# Patient Record
Sex: Male | Born: 1945
Health system: Southern US, Community
[De-identification: ages and names within clinical notes are randomized; demographics above are authoritative.]

## PROBLEM LIST (undated history)

## (undated) DIAGNOSIS — E785 Hyperlipidemia, unspecified: Secondary | ICD-10-CM

## (undated) DIAGNOSIS — I5032 Chronic diastolic (congestive) heart failure: Secondary | ICD-10-CM

## (undated) DIAGNOSIS — G4730 Sleep apnea, unspecified: Secondary | ICD-10-CM

## (undated) DIAGNOSIS — I8390 Asymptomatic varicose veins of unspecified lower extremity: Secondary | ICD-10-CM

## (undated) DIAGNOSIS — I214 Non-ST elevation (NSTEMI) myocardial infarction: Secondary | ICD-10-CM

## (undated) DIAGNOSIS — K635 Polyp of colon: Secondary | ICD-10-CM

## (undated) DIAGNOSIS — D649 Anemia, unspecified: Secondary | ICD-10-CM

## (undated) DIAGNOSIS — H269 Unspecified cataract: Secondary | ICD-10-CM

## (undated) DIAGNOSIS — N179 Acute kidney failure, unspecified: Secondary | ICD-10-CM

## (undated) DIAGNOSIS — F329 Major depressive disorder, single episode, unspecified: Secondary | ICD-10-CM

## (undated) DIAGNOSIS — R0600 Dyspnea, unspecified: Secondary | ICD-10-CM

## (undated) DIAGNOSIS — F32A Depression, unspecified: Secondary | ICD-10-CM

## (undated) DIAGNOSIS — M1990 Unspecified osteoarthritis, unspecified site: Secondary | ICD-10-CM

## (undated) DIAGNOSIS — I5033 Acute on chronic diastolic (congestive) heart failure: Secondary | ICD-10-CM

## (undated) DIAGNOSIS — J189 Pneumonia, unspecified organism: Secondary | ICD-10-CM

## (undated) DIAGNOSIS — I10 Essential (primary) hypertension: Secondary | ICD-10-CM

## (undated) HISTORY — DX: Hyperlipidemia, unspecified: E78.5

## (undated) HISTORY — DX: Essential (primary) hypertension: I10

## (undated) HISTORY — DX: Asymptomatic varicose veins of unspecified lower extremity: I83.90

## (undated) HISTORY — DX: Unspecified cataract: H26.9

## (undated) HISTORY — PX: ENDOSCOPIC VEIN LASER TREATMENT: SHX1508

## (undated) HISTORY — PX: TONSILLECTOMY: SUR1361

## (undated) HISTORY — DX: Polyp of colon: K63.5

## (undated) HISTORY — DX: Sleep apnea, unspecified: G47.30

---

## 2008-11-04 ENCOUNTER — Encounter (INDEPENDENT_AMBULATORY_CARE_PROVIDER_SITE_OTHER): Payer: Self-pay | Admitting: Gastroenterology

## 2008-11-04 ENCOUNTER — Ambulatory Visit (HOSPITAL_COMMUNITY): Admission: RE | Admit: 2008-11-04 | Discharge: 2008-11-04 | Payer: Self-pay | Admitting: Gastroenterology

## 2011-08-21 ENCOUNTER — Other Ambulatory Visit: Payer: Self-pay | Admitting: Family Medicine

## 2012-09-24 ENCOUNTER — Other Ambulatory Visit: Payer: Self-pay | Admitting: Family Medicine

## 2012-10-23 ENCOUNTER — Encounter: Payer: Self-pay | Admitting: Vascular Surgery

## 2012-10-23 ENCOUNTER — Other Ambulatory Visit: Payer: Self-pay | Admitting: *Deleted

## 2012-10-23 DIAGNOSIS — M7989 Other specified soft tissue disorders: Secondary | ICD-10-CM

## 2012-10-23 DIAGNOSIS — L539 Erythematous condition, unspecified: Secondary | ICD-10-CM

## 2012-11-04 DIAGNOSIS — R7301 Impaired fasting glucose: Secondary | ICD-10-CM | POA: Insufficient documentation

## 2012-11-04 DIAGNOSIS — I10 Essential (primary) hypertension: Secondary | ICD-10-CM

## 2012-11-04 DIAGNOSIS — E782 Mixed hyperlipidemia: Secondary | ICD-10-CM | POA: Insufficient documentation

## 2012-11-04 HISTORY — DX: Essential (primary) hypertension: I10

## 2012-11-12 ENCOUNTER — Encounter: Payer: Self-pay | Admitting: Vascular Surgery

## 2012-11-13 ENCOUNTER — Ambulatory Visit (HOSPITAL_COMMUNITY)
Admission: RE | Admit: 2012-11-13 | Discharge: 2012-11-13 | Disposition: A | Discharge status: Home / Self Care | Payer: Medicare Other | Source: Ambulatory Visit | Attending: Vascular Surgery | Admitting: Vascular Surgery

## 2012-11-13 ENCOUNTER — Encounter (HOSPITAL_COMMUNITY): Payer: Self-pay | Admitting: Emergency Medicine

## 2012-11-13 ENCOUNTER — Ambulatory Visit (INDEPENDENT_AMBULATORY_CARE_PROVIDER_SITE_OTHER): Payer: Medicare Other | Admitting: Vascular Surgery

## 2012-11-13 ENCOUNTER — Encounter: Payer: Self-pay | Admitting: Vascular Surgery

## 2012-11-13 ENCOUNTER — Emergency Department (HOSPITAL_COMMUNITY)
Admission: EM | Admit: 2012-11-13 | Discharge: 2012-11-14 | Disposition: A | Discharge status: Home / Self Care | Payer: Medicare Other | Attending: Emergency Medicine | Admitting: Emergency Medicine

## 2012-11-13 VITALS — Wt 369.0 lb

## 2012-11-13 DIAGNOSIS — L539 Erythematous condition, unspecified: Secondary | ICD-10-CM

## 2012-11-13 DIAGNOSIS — I83893 Varicose veins of bilateral lower extremities with other complications: Secondary | ICD-10-CM | POA: Insufficient documentation

## 2012-11-13 DIAGNOSIS — I83891 Varicose veins of right lower extremities with other complications: Secondary | ICD-10-CM

## 2012-11-13 DIAGNOSIS — Z862 Personal history of diseases of the blood and blood-forming organs and certain disorders involving the immune mechanism: Secondary | ICD-10-CM | POA: Insufficient documentation

## 2012-11-13 DIAGNOSIS — M7989 Other specified soft tissue disorders: Secondary | ICD-10-CM | POA: Insufficient documentation

## 2012-11-13 DIAGNOSIS — I8310 Varicose veins of unspecified lower extremity with inflammation: Secondary | ICD-10-CM

## 2012-11-13 DIAGNOSIS — Z79899 Other long term (current) drug therapy: Secondary | ICD-10-CM | POA: Insufficient documentation

## 2012-11-13 DIAGNOSIS — Z8669 Personal history of other diseases of the nervous system and sense organs: Secondary | ICD-10-CM | POA: Insufficient documentation

## 2012-11-13 DIAGNOSIS — Z8639 Personal history of other endocrine, nutritional and metabolic disease: Secondary | ICD-10-CM | POA: Insufficient documentation

## 2012-11-13 DIAGNOSIS — I10 Essential (primary) hypertension: Secondary | ICD-10-CM | POA: Insufficient documentation

## 2012-11-13 DIAGNOSIS — Z860100 Personal history of colon polyps, unspecified: Secondary | ICD-10-CM | POA: Insufficient documentation

## 2012-11-13 NOTE — ED Notes (Signed)
Dressing removed.  Bleeding controlled.

## 2012-11-13 NOTE — ED Notes (Signed)
Patient is alert and oriented x3.  He is complaining of bleeding from a varicose vein in the right lower extremity.  He denies having this issue before.  Bleeding has been dressing applied.

## 2012-11-13 NOTE — ED Notes (Signed)
Pt reports having varicose veins and noticed blood to leg while going into the bathroom. Does not recall hitting leg on anything. Denies any dizziness or lightheadedness. Pt reports a moderate flow from area but not projectile blood from site.

## 2012-11-13 NOTE — Progress Notes (Signed)
[facility] [place] HISTORY AND PHYSICAL   History of Present Illness:  Patient is a 67 y.o. year old male who presents for evaluation of varicose veins and leg swelling.  Other medical problems include hypertension, hyperlipidemia, sleep apnea, obesity. He is currently in a weight control program at [facility]. The patient has had intermittent swelling in both lower extremities over the last two-year's. He also has itching of his skin over several varicosities in both legs. This is been controlled in the past with cortisone cream and occasional antibiotics for inflammation. He has not used compression stockings in the past. His right leg is worse than his left. He denies prior history of DVT. He has had no prior bleeding episode or ulcerations. His legs get heavy or and ache if he is on his feet for long periods of time.  Past Medical History  Diagnosis Date  . Hypertension   . Hyperlipidemia   . Sleep apnea   . Colon polyps   . Cataracts, bilateral     Past Surgical History  Procedure Laterality Date  . Tonsillectomy      Social History History  Substance Use Topics  . Smoking status: Never Smoker   . Smokeless tobacco: Never Used  . Alcohol Use: No    Family History Family History  Problem Relation Age of Onset  . Cancer Mother     colon  . Other Mother     varicose veins  . Heart disease Father   . Heart attack Father   . Diabetes Sister     Allergies  No Known Allergies   Current Outpatient Prescriptions  Medication Sig Dispense Refill  . buPROPion (WELLBUTRIN XL) 150 MG 24 hr tablet Take 150 mg by mouth daily.      . Cholecalciferol (VITAMIN D3) 2000 UNITS TABS Take by mouth.      . ezetimibe-simvastatin (VYTORIN) 10-40 MG per tablet Take 1 tablet by mouth at bedtime.      . ferrous sulfate 325 (65 FE) MG tablet Take 325 mg by mouth daily with breakfast.      . losartan-hydrochlorothiazide (HYZAAR) 100-25 MG per tablet Take 1  tablet by mouth daily.      . Multiple Vitamin (MULTIVITAMIN) tablet Take 1 tablet by mouth daily.      . Sulfamethoxazole-Trimethoprim (BACTRIM PO) Take by mouth.      . topiramate (TOPAMAX) 25 MG capsule Take 25 mg by mouth 2 (two) times daily.       No current facility-administered medications for this visit.    ROS:   General:  + weight loss 35 pounds since starting program, Fever, chills  HEENT: No recent headaches, no nasal bleeding, no visual changes, no sore throat  Neurologic: No dizziness, blackouts, seizures. No recent symptoms of stroke or mini- stroke. No recent episodes of slurred speech, or temporary blindness.  Cardiac: No recent episodes of chest pain/pressure, no shortness of breath at rest.  +shortness of breath with exertion.  Denies history of atrial fibrillation or irregular heartbeat  Vascular: No history of rest pain in feet.  No history of claudication.  No history of non-healing ulcer, No history of DVT   Pulmonary: No home oxygen, no productive cough, no hemoptysis,  No asthma or wheezing  Musculoskeletal:  [ ]  Arthritis, [ ]  Low back pain,  [x Joint pain  Hematologic:No history of hypercoagulable state.  No history of easy bleeding.  No history of anemia  Gastrointestinal: No hematochezia or  melena,  No gastroesophageal reflux, no trouble swallowing  Urinary: [ ]  chronic Kidney disease, [ ]  on HD - [ ]  MWF or [ ]  TTHS, [ ]  Burning with urination, [ ]  Frequent urination, [ ]  Difficulty urinating;   Skin: No rashes  Psychological: No history of anxiety,  No history of depression   Physical Examination  Filed Vitals:   11/13/12 1037  Weight: 369 lb (167.377 kg)    General:  Alert and oriented, no acute distress HEENT: Normal Neck: No bruit or JVD Pulmonary: Clear to auscultation bilaterally Cardiac: Regular Rate and Rhythm without murmur Abdomen: Soft, non-tender, non-distended, no mass, severe obesity Skin: No rash, bilateral hemosiderin  staining circumferential calf, no ulcerations, large posterior 78 mm clusters of varicosities posterior medial calf bilaterally Extremity Pulses:  2+ radial, brachial, femoral, dorsalis pedis, posterior tibial pulses bilaterally Musculoskeletal: No deformity trace edema  Neurologic: Upper and lower extremity motor 5/5 and symmetric  DATA:  The patient had a venous duplex exam today of his left lower extremity. This showed deep venous reflux as well as superficial venous reflux vein diameter was 5-13 mm. I reviewed and interpreted this study.   ASSESSMENT:  Symptomatic varicose veins with stasis dermatitis and leg swelling   PLAN:  The patient was given a prescription today for bilateral compression stockings. I discussed the pathophysiology of superficial and deep venous reflux today. I also discussed potentially doing a laser ablation of his greater saphenous vein. The patient is going to work on continued weight loss for now. He will followup next make 2015 for further review.  Fabienne Bruns, MD [facility] [place] Office: [phone] Pager: [phone]

## 2012-11-14 NOTE — ED Provider Notes (Signed)
CSN: [id number]     Arrival date & time 11/13/12  2207 History   First MD Initiated Contact with Patient 11/13/12 2338     Chief Complaint  Patient presents with  . Varicose Veins   (Consider location/radiation/quality/duration/timing/severity/associated sxs/prior Treatment) Patient is a 67 y.o. male presenting with leg pain. The history is provided by the patient.  Leg Pain Location:  Leg Leg location:  R upper leg Associated symptoms comment:  He has varicose veins in bilateral lower extremities. Tonight, without known injury, he started bleeding on anterior right thigh at the site of one of the varicosities. No pain.    Past Medical History  Diagnosis Date  . Hypertension   . Hyperlipidemia   . Sleep apnea   . Colon polyps   . Cataracts, bilateral    Past Surgical History  Procedure Laterality Date  . Tonsillectomy     Family History  Problem Relation Age of Onset  . Cancer Mother     colon  . Other Mother     varicose veins  . Heart disease Father   . Heart attack Father   . Diabetes Sister    History  Substance Use Topics  . Smoking status: Never Smoker   . Smokeless tobacco: Never Used  . Alcohol Use: No    Review of Systems  Musculoskeletal:       See HPI.    Allergies  Review of patient's allergies indicates no known allergies.  Home Medications   Current Outpatient Rx  Name  Route  Sig  Dispense  Refill  . buPROPion (WELLBUTRIN XL) 150 MG 24 hr tablet   Oral   Take 150 mg by mouth daily.         . Cholecalciferol (VITAMIN D3) 2000 UNITS TABS   Oral   Take by mouth.         . ezetimibe-simvastatin (VYTORIN) 10-40 MG per tablet   Oral   Take 1 tablet by mouth at bedtime.         . ferrous sulfate 325 (65 FE) MG tablet   Oral   Take 325 mg by mouth daily with breakfast.         . losartan-hydrochlorothiazide (HYZAAR) 100-25 MG per tablet   Oral   Take 1 tablet by mouth daily.         . Multiple Vitamin (MULTIVITAMIN)  tablet   Oral   Take 1 tablet by mouth daily.         Marland Kitchen topiramate (TOPAMAX) 25 MG capsule   Oral   Take 25 mg by mouth 2 (two) times daily.          BP 144/67  Pulse 66  Temp(Src) 98.7 F (37.1 C) (Oral)  Resp 20  SpO2 98% Physical Exam  Musculoskeletal:  Varicose veins anterior right thigh with small point of bleed. No swelling. No laceration or bruising.     ED Course  Procedures (including critical care time) Labs Review Labs Reviewed - No data to display Imaging Review No results found.  EKG Interpretation   None       MDM  No diagnosis found. 1. Varicose vein bleed  Dermabond applied with no further bleeding. The patient was ambulated to insure bleeding would not restart. He appears stable and is ready for discharge.    Arnoldo Hooker, PA-C 11/14/12 0020

## 2012-11-14 NOTE — ED Notes (Signed)
Patient is alert and oriented x3.  He was given DC instructions and follow up visit instructions.  Patient gave verbal understanding.  He was DC ambulatory under his own power to home.  V/S stable.  He was not showing any signs of distress on DC 

## 2012-11-14 NOTE — ED Provider Notes (Signed)
Medical screening examination/treatment/procedure(s) were performed by non-physician practitioner and as supervising physician I was immediately available for consultation/collaboration.  EKG Interpretation   None        Brendan Gadson, MD 11/14/12 0448 

## 2012-11-14 NOTE — ED Notes (Signed)
Dermabond given to PA. 

## 2012-11-24 ENCOUNTER — Ambulatory Visit: Payer: Medicare Other | Admitting: Vascular Surgery

## 2012-11-24 ENCOUNTER — Encounter: Payer: Self-pay | Admitting: Vascular Surgery

## 2012-11-25 ENCOUNTER — Ambulatory Visit (HOSPITAL_COMMUNITY)
Admission: RE | Admit: 2012-11-25 | Discharge: 2012-11-25 | Disposition: A | Discharge status: Home / Self Care | Payer: Medicare Other | Source: Ambulatory Visit | Attending: Vascular Surgery | Admitting: Vascular Surgery

## 2012-11-25 ENCOUNTER — Ambulatory Visit (INDEPENDENT_AMBULATORY_CARE_PROVIDER_SITE_OTHER): Payer: Self-pay | Admitting: Vascular Surgery

## 2012-11-25 ENCOUNTER — Other Ambulatory Visit: Payer: Self-pay | Admitting: *Deleted

## 2012-11-25 ENCOUNTER — Encounter: Payer: Self-pay | Admitting: Vascular Surgery

## 2012-11-25 DIAGNOSIS — I781 Nevus, non-neoplastic: Secondary | ICD-10-CM

## 2012-11-25 DIAGNOSIS — I83893 Varicose veins of bilateral lower extremities with other complications: Secondary | ICD-10-CM | POA: Insufficient documentation

## 2012-11-25 NOTE — Progress Notes (Signed)
Subjective:     Patient ID: Justin Tate, male   DOB: 13-Aug-1945, 67 y.o.   MRN: [id number]  HPI S. 67 year old morbidly obese male was evaluated by Dr. Darrick Penna in October of this year. He has severe venous insufficiency of the left leg with chronic swelling bulging varicosities which are painful in skin changes. He was trying long-leg elastic compression stockings 20-30 mm gradient as well as elevation and ibuprofen and weight loss. 10 days ago he developed spontaneous bleeding from varicosity in the contralateral right leg. This leg has not been evaluated. This required a trip to the emergency department for 3 hours and eventual closure with surgical glue-Dermabond. He has had no recurrent bleeding. He does have itching and aching discomfort in the right leg but not as severe as the left.  Past Medical History  Diagnosis Date  . Hypertension   . Hyperlipidemia   . Sleep apnea   . Colon polyps   . Cataracts, bilateral     History  Substance Use Topics  . Smoking status: Never Smoker   . Smokeless tobacco: Never Used  . Alcohol Use: No    Family History  Problem Relation Age of Onset  . Cancer Mother     colon  . Other Mother     varicose veins  . Heart disease Father   . Heart attack Father   . Diabetes Sister     No Known Allergies  Current outpatient prescriptions:buPROPion (WELLBUTRIN XL) 150 MG 24 hr tablet, Take 150 mg by mouth daily., Disp: , Rfl: ;  Cholecalciferol (VITAMIN D3) 2000 UNITS TABS, Take by mouth., Disp: , Rfl: ;  ezetimibe-simvastatin (VYTORIN) 10-40 MG per tablet, Take 1 tablet by mouth at bedtime., Disp: , Rfl: ;  ferrous sulfate 325 (65 FE) MG tablet, Take 325 mg by mouth daily with breakfast., Disp: , Rfl:  losartan-hydrochlorothiazide (HYZAAR) 100-25 MG per tablet, Take 1 tablet by mouth daily., Disp: , Rfl: ;  Multiple Vitamin (MULTIVITAMIN) tablet, Take 1 tablet by mouth daily., Disp: , Rfl: ;  topiramate (TOPAMAX) 25 MG capsule, Take 25 mg by mouth 2 (two)  times daily., Disp: , Rfl:   There were no vitals taken for this visit.  There is no height or weight on file to calculate BMI.         Review of Systems denies chest pain or dyspnea on exertion     Objective:   Physical Exam There were no vitals taken for this visit.  Gen.-morbidly obese male patient in no apparent distress alert and oriented x3 Lungs no rhonchi or wheezing Left lower extremity with bulging varicosities below the knee and medial calf with hyperpigmentation and chronic edema left ankle. Right leg has diffuse spider and reticular veins with area of recent bleeding in the lateral thigh. No active ulceration is noted.  Patient has documented gross reflux in the left great saphenous system and will likely require laser ablation in the near future Today I ordered a venous duplex exam of the right leg which are reviewed and interpreted. Great saphenous vein does have reflux but it is a smaller caliber vein and there is no DVT in the right leg but there is deep venous reflux     Assessment:     #1 recent bleeding episode from superficial reticular vein right leg with reflux in right great saphenous vein that does not require ablation at the present time-needs foam sclerotherapy #2 severe superficial reflux left lower extremity and great saphenous vein with  painful varicosities and skin changes distally    Plan:     Will proceed with sclerotherapy of the site that bled in the right leg soon and have patient return in 3-6 months for evaluation and followup of the left leg to see if conservative management with stockings elevation and ibuprofen is making any difference. He is also in weight loss program at [facility] which he would like to actively engage in. If no significant improvement we will need laser ablation left great saphenous vein

## 2012-11-27 ENCOUNTER — Other Ambulatory Visit: Payer: Self-pay

## 2013-05-26 ENCOUNTER — Ambulatory Visit: Payer: Medicare Other | Admitting: Vascular Surgery

## 2013-06-12 ENCOUNTER — Encounter: Payer: Self-pay | Admitting: Vascular Surgery

## 2013-06-16 ENCOUNTER — Encounter: Payer: Self-pay | Admitting: Vascular Surgery

## 2013-06-16 ENCOUNTER — Ambulatory Visit (INDEPENDENT_AMBULATORY_CARE_PROVIDER_SITE_OTHER): Payer: Medicare Other | Admitting: Vascular Surgery

## 2013-06-16 VITALS — BP 110/79 | HR 84 | Ht 69.0 in | Wt 322.9 lb

## 2013-06-16 DIAGNOSIS — I83893 Varicose veins of bilateral lower extremities with other complications: Secondary | ICD-10-CM

## 2013-06-16 NOTE — Progress Notes (Signed)
Subjective:     Patient ID: Justin Tate, male   DOB: Jan 22, 1946, 68 y.o.   MRN: [id number]  HPI this 68 year old male returns for continued followup regarding his chronic edema and pain particularly in bulging varicosities in the left leg. He has known gross reflux in the left great saphenous system. He also had some bleeding from the right ankle and a varix which was treated with sclerotherapy previously successfully. He has morbid obesity and has been treated at the weight loss clinic [facility] and has lost 120 pounds in the past year. He continues to have aching throbbing and burning discomfort in the left leg with chronic swelling.  Past Medical History  Diagnosis Date  . Hypertension   . Hyperlipidemia   . Sleep apnea   . Colon polyps   . Cataracts, bilateral     History  Substance Use Topics  . Smoking status: Never Smoker   . Smokeless tobacco: Never Used  . Alcohol Use: No    Family History  Problem Relation Age of Onset  . Cancer Mother     colon  . Other Mother     varicose veins  . Heart disease Father   . Heart attack Father   . Diabetes Sister     No Known Allergies  Current outpatient prescriptions:buPROPion (WELLBUTRIN XL) 150 MG 24 hr tablet, Take 150 mg by mouth daily., Disp: , Rfl: ;  Cholecalciferol (VITAMIN D3) 2000 UNITS TABS, Take by mouth., Disp: , Rfl: ;  ezetimibe-simvastatin (VYTORIN) 10-40 MG per tablet, Take 1 tablet by mouth at bedtime., Disp: , Rfl: ;  ferrous sulfate 325 (65 FE) MG tablet, Take 325 mg by mouth daily with breakfast., Disp: , Rfl:  losartan-hydrochlorothiazide (HYZAAR) 100-25 MG per tablet, Take 1 tablet by mouth daily., Disp: , Rfl: ;  Multiple Vitamin (MULTIVITAMIN) tablet, Take 1 tablet by mouth daily., Disp: , Rfl: ;  topiramate (TOPAMAX) 25 MG capsule, Take 25 mg by mouth 2 (two) times daily., Disp: , Rfl:   BP 110/79  Pulse 84  Ht 5\' 9"  (1.753 m)  Wt 322 lb 14.4 oz (146.466 kg)  BMI 47.66 kg/m2  SpO2 100%  Body  mass index is 47.66 kg/(m^2).           Review of Systems denies chest pain but does have dyspnea on exertion. No hemoptysis or claudication. Complains of sleep apnea    Objective:   Physical Exam BP 110/79  Pulse 84  Ht 5\' 9"  (1.753 m)  Wt 322 lb 14.4 oz (146.466 kg)  BMI 47.66 kg/m2  SpO2 100%  General morbidly obese male no apparent stress alert and oriented x3 Lungs no rhonchi or wheezing Left leg with ulcer and varicosities below the knee in the medial calf and chronic 1-2+ edema with early hyperpigmentation but no active ulcer. 3 posterior cells pedis pulse palpable. Right leg with diffuse spider and reticular veins particularly in the lateral thigh and calf extending down the lateral malleolus with no active ulcer      Assessment:     Gross reflux left great saphenous vein demonstrated at previous appointment supplying bulging varicosities left leg with no DVT. Patient has chronic pain in varicosities and chronic edema with skin changes left leg    Plan:     Patient needs laser ablation left great saphenous vein from distal thigh to saphenofemoral junction. Will precertified this to be performed in late August of this year to coordinate with his schedule and to help  relieve his symptoms and hopefully improve his edema and stabilize his skin changes

## 2013-06-18 ENCOUNTER — Ambulatory Visit: Payer: Medicare Other | Admitting: Vascular Surgery

## 2013-08-18 ENCOUNTER — Other Ambulatory Visit: Payer: Self-pay | Admitting: *Deleted

## 2013-08-18 DIAGNOSIS — I83229 Varicose veins of left lower extremity with both ulcer of unspecified site and inflammation: Principal | ICD-10-CM

## 2013-08-18 DIAGNOSIS — I83222 Varicose veins of left lower extremity with both ulcer of calf and inflammation: Principal | ICD-10-CM

## 2013-08-18 DIAGNOSIS — I83225 Varicose veins of left lower extremity with both ulcer other part of foot and inflammation: Principal | ICD-10-CM

## 2013-08-18 DIAGNOSIS — I83223 Varicose veins of left lower extremity with both ulcer of ankle and inflammation: Principal | ICD-10-CM

## 2013-08-18 DIAGNOSIS — I83221 Varicose veins of left lower extremity with both ulcer of thigh and inflammation: Secondary | ICD-10-CM

## 2013-08-18 DIAGNOSIS — I83228 Varicose veins of left lower extremity with both ulcer of other part of lower extremity and inflammation: Principal | ICD-10-CM

## 2013-08-18 DIAGNOSIS — I83224 Varicose veins of left lower extremity with both ulcer of heel and midfoot and inflammation: Secondary | ICD-10-CM

## 2013-10-09 ENCOUNTER — Encounter: Payer: Self-pay | Admitting: Vascular Surgery

## 2013-10-12 ENCOUNTER — Ambulatory Visit (INDEPENDENT_AMBULATORY_CARE_PROVIDER_SITE_OTHER): Payer: Medicare Other | Admitting: Vascular Surgery

## 2013-10-12 ENCOUNTER — Encounter: Payer: Self-pay | Admitting: Vascular Surgery

## 2013-10-12 VITALS — BP 125/75 | HR 82 | Resp 18 | Ht 70.0 in | Wt 323.0 lb

## 2013-10-12 DIAGNOSIS — I83893 Varicose veins of bilateral lower extremities with other complications: Secondary | ICD-10-CM

## 2013-10-12 NOTE — Progress Notes (Signed)
   Laser Ablation Procedure      Date: 10/12/2013    Justin Tate DOB:[birth year]  Consent signed: Yes  Surgeon:J.D. Kellie Simmering  Procedure: Laser Ablation: left Greater Saphenous Vein  BP 125/75  Pulse 82  Resp 18  Ht 5\' 10"  (1.778 m)  Wt 323 lb (146.512 kg)  BMI 46.35 kg/m2  Start time: 3:05   End time: 3:50  Tumescent Anesthesia: 300 cc 0.9% NaCl with 50 cc Lidocaine HCL with 1% Epi and 15 cc 8.4% NaHCO3  Local Anesthesia: 5 cc Lidocaine HCL and NaHCO3 (ratio 2:1)  Pulsed mode: 15 watts, 558ms delay, 1.0 duration Total energy: 2033, total pulses: 136, total time: 2:15     Patient tolerated procedure well: Yes  Notes:   Description of Procedure:  After marking the course of the secondary varicosities, the patient was placed on the operating table in the supine position, and the left leg was prepped and draped in sterile fashion.   Local anesthetic was administered and under ultrasound guidance the saphenous vein was accessed with a micro needle and guide wire; then the mirco puncture sheath was place.  A guide wire was inserted saphenofemoral junction , followed by a 5 french sheath.  The position of the sheath and then the laser fiber below the junction was confirmed using the ultrasound.  Tumescent anesthesia was administered along the course of the saphenous vein using ultrasound guidance. The patient was placed in Trendelenburg position and protective laser glasses were placed on patient and staff, and the laser was fired at 15 watt pulsed mode advancing 1-2 mm per sec for a total of 2033 joules.     Steri strips were applied to the stab wounds and ABD pads and thigh high compression stockings were applied.  Ace wrap bandages were applied over the phlebectomy sites and at the top of the saphenofemoral junction. Blood loss was less than 15 cc.  The patient ambulated out of the operating room having tolerated the procedure well.

## 2013-10-12 NOTE — Progress Notes (Signed)
Subjective:     Patient ID: Justin Tate, male   DOB: Nov 01, 1945, 68 y.o.   MRN: [id number]  HPI this 68 year old male had laser ablation of the left great saphenous vein performed under local tumescent anesthesia for venous hypertension with chronic edema, skin changes, and history of venous ulcer. He tolerated the procedure well. A total of 2033 J of energy was utilized    Review of Systems     Objective:   Physical Exam BP 125/75  Pulse 82  Resp 18  Ht 5\' 10"  (1.778 m)  Wt 323 lb (146.512 kg)  BMI 46.35 kg/m2       Assessment:     Well-tolerated laser ablation left great saphenous line performed under local tumescent anesthesia for venous hypertension with gross reflux and history of stasis ulcer    Plan:     Return September 30 for venous duplex exam to confirm closure left great saphenous vein

## 2013-10-13 ENCOUNTER — Telehealth: Payer: Self-pay | Admitting: *Deleted

## 2013-10-13 NOTE — Telephone Encounter (Signed)
Patient doing well. Following all instructions. Discussed ace wrap concerns. Reminded him of his fu appts.

## 2013-10-16 ENCOUNTER — Telehealth: Payer: Self-pay | Admitting: *Deleted

## 2013-10-16 NOTE — Telephone Encounter (Signed)
Justin Tate is s/p EVLA of [facility] on 10-12-2013 by Dr. Kellie Simmering.  He called with questions regarding exercise and having pain when he bends his left leg.  Advised him that walking was appropriate exercise but he usually walks 5 miles per day and i suggested that 1/2-1 mile might be more appropriate at this time in the post op course and to gradually work up to his 5 mile routine.  He states he has soreness in his left upper thigh and some pain when he bends his left leg.  Suggested that he take Ibuprofen TID as directed with meals and use ice compress as needed and to elevate left leg when sitting and to continue wearing compression hose.  Reminded him of his follow up with Dr. Kellie Simmering and ultrasound on 10-21-2013.  Verbalized understanding.  To call [facility] for further questions or concerns.

## 2013-10-19 ENCOUNTER — Encounter (HOSPITAL_COMMUNITY): Payer: Medicare Other

## 2013-10-19 ENCOUNTER — Ambulatory Visit: Payer: Medicare Other | Admitting: Vascular Surgery

## 2013-10-20 ENCOUNTER — Encounter: Payer: Self-pay | Admitting: Vascular Surgery

## 2013-10-21 ENCOUNTER — Encounter: Payer: Self-pay | Admitting: Vascular Surgery

## 2013-10-21 ENCOUNTER — Ambulatory Visit (HOSPITAL_COMMUNITY)
Admission: RE | Admit: 2013-10-21 | Discharge: 2013-10-21 | Disposition: A | Discharge status: Home / Self Care | Payer: Medicare Other | Source: Ambulatory Visit | Attending: Vascular Surgery | Admitting: Vascular Surgery

## 2013-10-21 ENCOUNTER — Ambulatory Visit (INDEPENDENT_AMBULATORY_CARE_PROVIDER_SITE_OTHER): Payer: Medicare Other | Admitting: Vascular Surgery

## 2013-10-21 VITALS — BP 147/80 | HR 70 | Resp 16 | Ht 70.0 in | Wt 320.0 lb

## 2013-10-21 DIAGNOSIS — L97929 Non-pressure chronic ulcer of unspecified part of left lower leg with unspecified severity: Principal | ICD-10-CM

## 2013-10-21 DIAGNOSIS — I83219 Varicose veins of right lower extremity with both ulcer of unspecified site and inflammation: Secondary | ICD-10-CM | POA: Insufficient documentation

## 2013-10-21 DIAGNOSIS — I83228 Varicose veins of left lower extremity with both ulcer of other part of lower extremity and inflammation: Secondary | ICD-10-CM

## 2013-10-21 DIAGNOSIS — I83225 Varicose veins of left lower extremity with both ulcer other part of foot and inflammation: Secondary | ICD-10-CM

## 2013-10-21 DIAGNOSIS — Z9889 Other specified postprocedural states: Secondary | ICD-10-CM | POA: Insufficient documentation

## 2013-10-21 DIAGNOSIS — L97919 Non-pressure chronic ulcer of unspecified part of right lower leg with unspecified severity: Principal | ICD-10-CM

## 2013-10-21 DIAGNOSIS — I83229 Varicose veins of left lower extremity with both ulcer of unspecified site and inflammation: Principal | ICD-10-CM

## 2013-10-21 DIAGNOSIS — I83221 Varicose veins of left lower extremity with both ulcer of thigh and inflammation: Secondary | ICD-10-CM

## 2013-10-21 DIAGNOSIS — I83223 Varicose veins of left lower extremity with both ulcer of ankle and inflammation: Secondary | ICD-10-CM

## 2013-10-21 DIAGNOSIS — I83222 Varicose veins of left lower extremity with both ulcer of calf and inflammation: Secondary | ICD-10-CM

## 2013-10-21 DIAGNOSIS — I83893 Varicose veins of bilateral lower extremities with other complications: Secondary | ICD-10-CM

## 2013-10-21 DIAGNOSIS — I83224 Varicose veins of left lower extremity with both ulcer of heel and midfoot and inflammation: Secondary | ICD-10-CM

## 2013-10-21 NOTE — Progress Notes (Signed)
Subjective:     Patient ID: Justin Tate, male   DOB: 02-11-1945, 69 y.o.   MRN: [id number]  HPI this 68 year old male returns 1 week post laser oblation left great saphenous vein for gross reflux in the great saphenous system as well as deep venous reflux and a history of skin changes and previous stasis ulcer. Because of the thigh size we were unable to use long leg elastic compression stocking. He did use a short-leg stocking for a few days but then became unable to put that on. He has not been elevating his leg frequently but has elevated some. He has had moderate discomfort in the left proximal thigh area. He has had some increasing edema below the knee presumably because of lack of elevation and compression.  Past Medical History  Diagnosis Date  . Hypertension   . Hyperlipidemia   . Sleep apnea   . Colon polyps   . Cataracts, bilateral   . Varicose veins     History  Substance Use Topics  . Smoking status: Never Smoker   . Smokeless tobacco: Never Used  . Alcohol Use: No    Family History  Problem Relation Age of Onset  . Cancer Mother     colon  . Other Mother     varicose veins  . Heart disease Father   . Heart attack Father   . Diabetes Sister     No Known Allergies  Current outpatient prescriptions:buPROPion (WELLBUTRIN XL) 150 MG 24 hr tablet, Take 150 mg by mouth daily., Disp: , Rfl: ;  Cholecalciferol (VITAMIN D3) 2000 UNITS TABS, Take by mouth., Disp: , Rfl: ;  ezetimibe-simvastatin (VYTORIN) 10-40 MG per tablet, Take 1 tablet by mouth at bedtime., Disp: , Rfl: ;  ferrous sulfate 325 (65 FE) MG tablet, Take 325 mg by mouth daily with breakfast., Disp: , Rfl:  losartan-hydrochlorothiazide (HYZAAR) 100-25 MG per tablet, Take 1 tablet by mouth daily., Disp: , Rfl: ;  Multiple Vitamin (MULTIVITAMIN) tablet, Take 1 tablet by mouth daily., Disp: , Rfl: ;  topiramate (TOPAMAX) 25 MG capsule, Take 25 mg by mouth 2 (two) times daily., Disp: , Rfl:   BP 147/80  Pulse 70   Resp 16  Ht 5\' 10"  (1.778 m)  Wt 320 lb (145.151 kg)  BMI 45.92 kg/m2  Body mass index is 45.92 kg/(m^2).           Review of Systems denies chest pain, dyspnea on exertion other than what he was experiencing preoperatively. No hemoptysis reported.     Objective:   Physical Exam BP 147/80  Pulse 70  Resp 16  Ht 5\' 10"  (1.778 m)  Wt 320 lb (145.151 kg)  BMI 45.92 kg/m2  Gen. morbidly obese male in no apparent distress alert and oriented x3 Lungs no rhonchi or wheezing Cardiovascular regular rhythm no murmurs Left leg with 1+ edema particularly below the knee. He has moderate tenderness to palpation in the proximal thigh over the great saphenous spine. No blistering or erythema noted.  The lower venous duplex exam of the left leg which are reviewed and interpreted. There is no DVT. There is successful ablation of the left great saphenous vein from the distal thigh to near the saphenofemoral junctions-5 cm. He has known deep venous reflux.     Assessment:     Successful laser ablation left great saphenous vine for gross reflux and chronic edema, skin changes, and history of ulcer.    Plan:     The patient will  place 2-3 inch blocks under foot of bed to assist in elevation at night Will get back to short-leg elastic compression stockings as soon as feasible Return to see [place] on when necessary basis

## 2014-01-18 ENCOUNTER — Telehealth: Payer: Self-pay | Admitting: *Deleted

## 2014-01-18 NOTE — Telephone Encounter (Signed)
Pt had concerns about swelling. He thinks he has seen some improvement but the swelling is not completely gone. I told him swelling can take longer to resolve and that some of his swelling may be from other organs not just his venous system. We discussed compression, exercise, etc. He expressed reassurance ans will continue to be patient and to monitor the situation.

## 2014-06-09 ENCOUNTER — Other Ambulatory Visit: Payer: Self-pay | Admitting: Gastroenterology

## 2014-07-21 ENCOUNTER — Other Ambulatory Visit: Payer: Self-pay | Admitting: *Deleted

## 2014-07-21 DIAGNOSIS — I83892 Varicose veins of left lower extremities with other complications: Secondary | ICD-10-CM

## 2014-07-29 ENCOUNTER — Encounter: Payer: Self-pay | Admitting: Vascular Surgery

## 2014-08-02 ENCOUNTER — Encounter: Payer: Self-pay | Admitting: Vascular Surgery

## 2014-08-02 ENCOUNTER — Ambulatory Visit (INDEPENDENT_AMBULATORY_CARE_PROVIDER_SITE_OTHER): Payer: Medicare Other | Admitting: Vascular Surgery

## 2014-08-02 ENCOUNTER — Ambulatory Visit (HOSPITAL_COMMUNITY)
Admission: RE | Admit: 2014-08-02 | Discharge: 2014-08-02 | Disposition: A | Discharge status: Home / Self Care | Payer: Medicare Other | Source: Ambulatory Visit | Attending: Vascular Surgery | Admitting: Vascular Surgery

## 2014-08-02 VITALS — BP 127/75 | HR 75 | Temp 97.0°F | Resp 24 | Ht 69.25 in | Wt 358.3 lb

## 2014-08-02 DIAGNOSIS — M79605 Pain in left leg: Secondary | ICD-10-CM | POA: Diagnosis not present

## 2014-08-02 DIAGNOSIS — I83892 Varicose veins of left lower extremities with other complications: Secondary | ICD-10-CM | POA: Insufficient documentation

## 2014-08-02 DIAGNOSIS — I8390 Asymptomatic varicose veins of unspecified lower extremity: Secondary | ICD-10-CM | POA: Insufficient documentation

## 2014-08-02 DIAGNOSIS — Z9889 Other specified postprocedural states: Secondary | ICD-10-CM | POA: Diagnosis not present

## 2014-08-02 DIAGNOSIS — I868 Varicose veins of other specified sites: Secondary | ICD-10-CM | POA: Diagnosis not present

## 2014-08-02 NOTE — Progress Notes (Signed)
Subjective:     Patient ID: Justin Tate, male   DOB: February 07, 1945, 69 y.o.   MRN: [id number]  HPI this 69 year old male was seen today for pain intermittently in the left upper thigh. He underwent laser ablation of the left great saphenous vein by me in September 2015 for pain and swelling with severe skin changes in the lower third of the left leg. He states the edema in the left ankle has improved since that time he has had no stasis ulcers. He occasionally wears elastic compression stockings. He also had a bleed from a varix in the right leg treated with sclerotherapy. 2 weeks ago he developed some pain in the evening in the left anterolateral thigh while visiting his sister. He was unable to sleep. The next day he had more of the pain but it is now resolved and has not returned. He has no history of DVT. He did lose significant weight at [facility] from 440 pounds to 350 pounds. He is concerned about whether he has a "blood clot in the left leg".  Past Medical History  Diagnosis Date  . Hypertension   . Hyperlipidemia   . Sleep apnea   . Colon polyps   . Cataracts, bilateral   . Varicose veins     History  Substance Use Topics  . Smoking status: Never Smoker   . Smokeless tobacco: Never Used  . Alcohol Use: No    Family History  Problem Relation Age of Onset  . Cancer Mother     colon  . Other Mother     varicose veins  . Heart disease Father   . Heart attack Father   . Diabetes Sister     No Known Allergies   Current outpatient prescriptions:  .  buPROPion (WELLBUTRIN XL) 300 MG 24 hr tablet, Take 300 mg by mouth daily., Disp: , Rfl:  .  Cholecalciferol (VITAMIN D3) 2000 UNITS TABS, Take by mouth., Disp: , Rfl:  .  ezetimibe-simvastatin (VYTORIN) 10-40 MG per tablet, Take 1 tablet by mouth at bedtime., Disp: , Rfl:  .  ferrous sulfate 325 (65 FE) MG tablet, Take 325 mg by mouth daily with breakfast., Disp: , Rfl:  .  mirabegron ER (MYRBETRIQ) 25 MG TB24 tablet, Take  25 mg by mouth daily., Disp: , Rfl:  .  Multiple Vitamin (MULTIVITAMIN) tablet, Take 1 tablet by mouth daily., Disp: , Rfl:  .  buPROPion (WELLBUTRIN XL) 150 MG 24 hr tablet, Take 150 mg by mouth daily., Disp: , Rfl:  .  losartan-hydrochlorothiazide (HYZAAR) 100-25 MG per tablet, Take 1 tablet by mouth daily., Disp: , Rfl:  .  topiramate (TOPAMAX) 25 MG capsule, Take 25 mg by mouth 2 (two) times daily., Disp: , Rfl:   Filed Vitals:   08/02/14 1231  BP: 127/75  Pulse: 75  Temp: 97 F (36.1 C)  TempSrc: Oral  Resp: 24  Height: 5' 9.25" (1.759 m)  Weight: 358 lb 4.8 oz (162.524 kg)    Body mass index is 52.53 kg/(m^2).         Review of Systems has morbid obesity. Denies chest pain but does have chronic dyspnea on exertion.    Objective:   Physical Exam BP 127/75 mmHg  Pulse 75  Temp(Src) 97 F (36.1 C) (Oral)  Resp 24  Ht 5' 9.25" (1.759 m)  Wt 358 lb 4.8 oz (162.524 kg)  BMI 52.53 kg/m2  General morbidly obese male in no apparent distress alert and oriented 3 Lungs  no rhonchi or wheezing Left leg with chronic edema with hyperpigmentation lower third. No active ulcers noted. 2+ dorsalis pedis pulse palpable. No bulging varicosities noted from mid thigh distally.  Today I ordered a venous duplex exam the left leg which are reviewed and interpreted. There is no DVT. There is some areas of partial recanalization in the left great saphenous vein with multiple branches that do have reflux but the vein is not of large caliber as it was previously. Left small saphenous vein has no reflux.     Assessment:     History of intermittent leg pain 2 weeks ago which is now resolved Previous history of laser ablation left great saphenous vein with areas of recanalization Deep venous reflux which is chronic Morbid obesity    Plan:     Patient reassured that the leg pain was not due to any change in his vasculature He will return to see [place] on a when necessary basis The lower  elastic compression stockings as necessary

## 2015-09-05 ENCOUNTER — Other Ambulatory Visit: Payer: Self-pay | Admitting: Orthopedic Surgery

## 2015-09-05 DIAGNOSIS — M25461 Effusion, right knee: Secondary | ICD-10-CM

## 2015-09-05 DIAGNOSIS — M25561 Pain in right knee: Secondary | ICD-10-CM

## 2015-09-14 ENCOUNTER — Ambulatory Visit
Admission: RE | Admit: 2015-09-14 | Discharge: 2015-09-14 | Disposition: A | Discharge status: Home / Self Care | Payer: Medicare Other | Source: Ambulatory Visit | Attending: Orthopedic Surgery | Admitting: Orthopedic Surgery

## 2015-09-14 DIAGNOSIS — M25561 Pain in right knee: Secondary | ICD-10-CM

## 2015-09-14 DIAGNOSIS — M25461 Effusion, right knee: Secondary | ICD-10-CM

## 2016-01-26 DIAGNOSIS — E7800 Pure hypercholesterolemia, unspecified: Secondary | ICD-10-CM | POA: Diagnosis not present

## 2016-01-26 DIAGNOSIS — I10 Essential (primary) hypertension: Secondary | ICD-10-CM | POA: Diagnosis not present

## 2016-01-26 DIAGNOSIS — I499 Cardiac arrhythmia, unspecified: Secondary | ICD-10-CM | POA: Diagnosis not present

## 2016-01-26 DIAGNOSIS — Z0000 Encounter for general adult medical examination without abnormal findings: Secondary | ICD-10-CM | POA: Diagnosis not present

## 2016-01-26 DIAGNOSIS — C61 Malignant neoplasm of prostate: Secondary | ICD-10-CM | POA: Diagnosis not present

## 2016-01-26 DIAGNOSIS — R69 Illness, unspecified: Secondary | ICD-10-CM | POA: Diagnosis not present

## 2016-01-26 DIAGNOSIS — Z6841 Body Mass Index (BMI) 40.0 and over, adult: Secondary | ICD-10-CM | POA: Diagnosis not present

## 2016-04-17 DIAGNOSIS — C61 Malignant neoplasm of prostate: Secondary | ICD-10-CM | POA: Diagnosis not present

## 2016-04-24 DIAGNOSIS — C61 Malignant neoplasm of prostate: Secondary | ICD-10-CM | POA: Diagnosis not present

## 2016-05-03 DIAGNOSIS — M1711 Unilateral primary osteoarthritis, right knee: Secondary | ICD-10-CM | POA: Diagnosis not present

## 2016-05-03 DIAGNOSIS — M25572 Pain in left ankle and joints of left foot: Secondary | ICD-10-CM | POA: Diagnosis not present

## 2016-05-12 DIAGNOSIS — Z0000 Encounter for general adult medical examination without abnormal findings: Secondary | ICD-10-CM | POA: Diagnosis not present

## 2016-05-12 DIAGNOSIS — M130 Polyarthritis, unspecified: Secondary | ICD-10-CM | POA: Diagnosis not present

## 2016-05-12 DIAGNOSIS — R234 Changes in skin texture: Secondary | ICD-10-CM | POA: Diagnosis not present

## 2016-05-12 DIAGNOSIS — H259 Unspecified age-related cataract: Secondary | ICD-10-CM | POA: Diagnosis not present

## 2016-05-12 DIAGNOSIS — C61 Malignant neoplasm of prostate: Secondary | ICD-10-CM | POA: Diagnosis not present

## 2016-05-12 DIAGNOSIS — I872 Venous insufficiency (chronic) (peripheral): Secondary | ICD-10-CM | POA: Diagnosis not present

## 2016-05-12 DIAGNOSIS — M47819 Spondylosis without myelopathy or radiculopathy, site unspecified: Secondary | ICD-10-CM | POA: Diagnosis not present

## 2016-05-12 DIAGNOSIS — R030 Elevated blood-pressure reading, without diagnosis of hypertension: Secondary | ICD-10-CM | POA: Diagnosis not present

## 2016-05-12 DIAGNOSIS — Z79899 Other long term (current) drug therapy: Secondary | ICD-10-CM | POA: Diagnosis not present

## 2016-05-12 DIAGNOSIS — R69 Illness, unspecified: Secondary | ICD-10-CM | POA: Diagnosis not present

## 2016-05-12 DIAGNOSIS — E785 Hyperlipidemia, unspecified: Secondary | ICD-10-CM | POA: Diagnosis not present

## 2016-06-19 DIAGNOSIS — S83241A Other tear of medial meniscus, current injury, right knee, initial encounter: Secondary | ICD-10-CM | POA: Diagnosis not present

## 2016-07-02 NOTE — H&P (Signed)
Justin Tate is an 71 y.o. male.   Chief Complaint: Right Knee Pain  HPI: Justin Tate is here today for recurrent medial right knee pain.  He has a large radial tear the medial meniscus based on an MRI scan that was done last year.  Cortisone shots provided temporary relief but the pain is returned.  He is strongly thinking about surgery.  He ambulates with a cane in his right hand.  He is 5 feet 9 is tall, weighs 370 pounds and his BMI 54.1.  He lives by himself and will probably need at least an overnight stay and possibly a short stay in a skilled nursing facility.  Past Medical History:  Diagnosis Date  . Cataracts, bilateral   . Colon polyps   . Hyperlipidemia   . Hypertension   . Sleep apnea   . Varicose veins     Past Surgical History:  Procedure Laterality Date  . TONSILLECTOMY      Family History  Problem Relation Age of Onset  . Cancer Mother        colon  . Other Mother        varicose veins  . Heart disease Father   . Heart attack Father   . Diabetes Sister    Social History:  reports that he has never smoked. He has never used smokeless tobacco. He reports that he does not drink alcohol or use drugs.  Allergies: No Known Allergies  No prescriptions prior to admission.    No results found for this or any previous visit (from the past 48 hour(s)). No results found.  Review of Systems  Constitutional: Positive for weight loss.  HENT:       Sinus problems  Cardiovascular: Positive for leg swelling.  Gastrointestinal: Negative.   Genitourinary: Positive for frequency and urgency.       Poor bladder control,  Prostate cancer  Musculoskeletal: Positive for joint pain.  Skin: Positive for rash.  Neurological: Negative.   Endo/Heme/Allergies: Negative.   Psychiatric/Behavioral: Positive for depression.    There were no vitals taken for this visit. Physical Exam  Constitutional: He is oriented to person, place, and time. He appears well-developed and  well-nourished.  HENT:  Head: Normocephalic and atraumatic.  Eyes: Pupils are equal, round, and reactive to light.  Neck: Normal range of motion. Neck supple.  Cardiovascular: Intact distal pulses.   Respiratory: Effort normal.  Musculoskeletal: He exhibits tenderness.  Tender along the medial joint line of the right knee range of motion is from 5-110 limited by adipose tissue, collateral ligaments are stable.  Varus stress exacerbates his pain.  I did review his plain radiographs he does have a normal layer of cartilage laterally about one third of the cartilage has gone medially and he does have small peripheral osteophytes.  Neurological: He is alert and oriented to person, place, and time.  Skin: Skin is warm and dry.  Psychiatric: He has a normal mood and affect. His behavior is normal. Judgment and thought content normal.     Assessment/Plan Assess: Symptomatic medial meniscal tear and a 71 year old man with a BMI 54.1, who lives by himself.  Plan: Patient will need arthroscopic surgery.  It will be done at the main hospital with at least an overnight stay and possibly more based on how he does with physical therapy.  If he does not pass physical therapy he may need a short stay in a skilled rehab center.  Risks and benefits of surgery discussed and all  questions answered.  Constant Mandeville R, PA-C 07/02/2016, 9:30 AM

## 2016-07-03 ENCOUNTER — Ambulatory Visit (HOSPITAL_COMMUNITY)
Admission: RE | Admit: 2016-07-03 | Discharge: 2016-07-03 | Disposition: A | Discharge status: Home / Self Care | Payer: Medicare HMO | Source: Ambulatory Visit | Attending: Orthopedic Surgery | Admitting: Orthopedic Surgery

## 2016-07-03 ENCOUNTER — Encounter (HOSPITAL_COMMUNITY)
Admission: RE | Admit: 2016-07-03 | Discharge: 2016-07-03 | Disposition: A | Discharge status: Home / Self Care | Payer: Medicare HMO | Source: Ambulatory Visit | Attending: Orthopedic Surgery | Admitting: Orthopedic Surgery

## 2016-07-03 ENCOUNTER — Encounter (HOSPITAL_COMMUNITY): Payer: Self-pay

## 2016-07-03 DIAGNOSIS — S83241A Other tear of medial meniscus, current injury, right knee, initial encounter: Secondary | ICD-10-CM | POA: Diagnosis not present

## 2016-07-03 DIAGNOSIS — Z01818 Encounter for other preprocedural examination: Secondary | ICD-10-CM

## 2016-07-03 DIAGNOSIS — Z01810 Encounter for preprocedural cardiovascular examination: Secondary | ICD-10-CM | POA: Insufficient documentation

## 2016-07-03 DIAGNOSIS — Z01812 Encounter for preprocedural laboratory examination: Secondary | ICD-10-CM | POA: Diagnosis not present

## 2016-07-03 DIAGNOSIS — I447 Left bundle-branch block, unspecified: Secondary | ICD-10-CM | POA: Diagnosis not present

## 2016-07-03 HISTORY — DX: Dyspnea, unspecified: R06.00

## 2016-07-03 HISTORY — DX: Major depressive disorder, single episode, unspecified: F32.9

## 2016-07-03 HISTORY — DX: Anemia, unspecified: D64.9

## 2016-07-03 HISTORY — DX: Unspecified osteoarthritis, unspecified site: M19.90

## 2016-07-03 HISTORY — DX: Depression, unspecified: F32.A

## 2016-07-03 HISTORY — DX: Pneumonia, unspecified organism: J18.9

## 2016-07-03 LAB — BASIC METABOLIC PANEL
Anion gap: 8 (ref 5–15)
BUN: 18 mg/dL (ref 6–20)
CALCIUM: 9.2 mg/dL (ref 8.9–10.3)
CO2: 26 mmol/L (ref 22–32)
Chloride: 105 mmol/L (ref 101–111)
Creatinine, Ser: 1.23 mg/dL (ref 0.61–1.24)
GFR, EST NON AFRICAN AMERICAN: 58 mL/min — AB (ref >60)
Glucose, Bld: 119 mg/dL — ABNORMAL HIGH (ref 65–99)
Potassium: 3.9 mmol/L (ref 3.5–5.1)
Sodium: 139 mmol/L (ref 135–145)

## 2016-07-03 LAB — CBC
HCT: 44.4 % (ref 39.0–52.0)
Hemoglobin: 14.1 g/dL (ref 13.0–17.0)
MCH: 28.6 pg (ref 26.0–34.0)
MCHC: 31.8 g/dL (ref 30.0–36.0)
MCV: 90.1 fL (ref 78.0–100.0)
PLATELETS: 210 10*3/uL (ref 150–400)
RBC: 4.93 MIL/uL (ref 4.22–5.81)
RDW: 14 % (ref 11.5–15.5)
WBC: 8.6 10*3/uL (ref 4.0–10.5)

## 2016-07-03 NOTE — Pre-Procedure Instructions (Addendum)
Justin Tate  07/03/2016      [id number] - [place], [place] - [address] [place] [address] Phone: [phone] Fax: [phone]    Your procedure is scheduled on 07/11/16.  Report to [facility] Admitting at 930 A.M.  Call this number if you have problems the morning of surgery:  [phone]   Remember:  Do not eat food or drink liquids after midnight.  Take these medicines the morning of surgery with A SIP OF WATER    bupropion(wellbutrin)  STOP all herbel meds, nsaids (aleve,naproxen,advil,ibuprofen)7 days prior to surgery starting tomorrow/07/04/16 including all vitamins/supplements,aspirin,   Do not wear jewelry, make-up or nail polish.  Do not wear lotions, powders, or perfumes, or deoderant.  Do not shave 48 hours prior to surgery.  Men may shave face and neck.  Do not bring valuables to the hospital.  [facility] is not responsible for any belongings or valuables.  Contacts, dentures or bridgework may not be worn into surgery.  Leave your suitcase in the car.  After surgery it may be brought to your room.  For patients admitted to the hospital, discharge time will be determined by your treatment team.  Patients discharged the day of surgery will not be allowed to drive home.   Special instructions:   Special Instructions: [place] - Preparing for Surgery  Before surgery, you can play an important role.  Because skin is not sterile, your skin needs to be as free of germs as possible.  You can reduce the number of germs on you skin by washing with CHG (chlorahexidine gluconate) soap before surgery.  CHG is an antiseptic cleaner which kills germs and bonds with the skin to continue killing germs even after washing.  Please DO NOT use if you have an allergy to CHG or antibacterial soaps.  If your skin becomes reddened/irritated stop using the CHG and inform your nurse when you arrive at Short Stay.  Do not shave (including legs  and underarms) for at least 48 hours prior to the first CHG shower.  You may shave your face.  Please follow these instructions carefully:   1.  Shower with CHG Soap the night before surgery and the morning of Surgery.  2.  If you choose to wash your hair, wash your hair first as usual with your normal shampoo.  3.  After you shampoo, rinse your hair and body thoroughly to remove the Shampoo.  4.  Use CHG as you would any other liquid soap.  You can apply chg directly  to the skin and wash gently with scrungie or a clean washcloth.  5.  Apply the CHG Soap to your body ONLY FROM THE NECK DOWN.  Do not use on open wounds or open sores.  Avoid contact with your eyes ears, mouth and genitals (private parts).  Wash genitals (private parts)       with your normal soap.  6.  Wash thoroughly, paying special attention to the area where your surgery will be performed.  7.  Thoroughly rinse your body with warm water from the neck down.  8.  DO NOT shower/wash with your normal soap after using and rinsing off the CHG Soap.  9.  Pat yourself dry with a clean towel.            10.  Wear clean pajamas.            11.  Place clean sheets on your  bed the night of your first shower and do not sleep with pets.  Day of Surgery  Do not apply any lotions/deodorants the morning of surgery.  Please wear clean clothes to the hospital/surgery center.  Please read over the  fact sheets that you were given.

## 2016-07-04 NOTE — Progress Notes (Addendum)
Anesthesia Chart Review:  Pt is a 71 year old male scheduled for R knee arthroscopy on 07/11/2016 with Frederik Pear, MD  - PCP is C. Melinda Crutch, MD  PMH includes:  HTN, hyperlipidemia, OSA, anemia. Never smoker. BMI 54  Medications include: iron, simvastatin.    Preoperative labs reviewed.    CXR 07/03/16: No acute cardiopulmonary disease.  EKG 07/03/16: sinus rhythm with 1st degree AV block with occasional PVCs.  LAD. LBBB.   Willeen Cass, FNP-BC [facility] Short Stay Surgical Center/Anesthesiology Phone: [phone] 07/04/2016 4:38 PM   Addendum:   Pt saw Cecilie Kicks, NP with cardiology 07/09/16.  LBBB was felt to be chronic. Echo ordered to evaluate LV function, results below.  Pt was cleared for surgery.   Echo 07/10/16:  - Left ventricle: The cavity size was normal. Wall thickness was increased in a pattern of moderate LVH. Systolic function was normal. The estimated ejection fraction was in the range of 60% to 65%. Wall motion was normal; there were no regional wall motion abnormalities. Features are consistent with apseudonormal left ventricular filling pattern, with concomitant abnormal relaxation and increased filling pressure (grade 2 diastolic dysfunction). - Aortic valve: There was mild stenosis. Valve area (Vmax): 2.81 cm^2. - Left atrium: The atrium was mildly dilated. - Right atrium: The atrium was mildly dilated.  If no changes, I anticipate pt can proceed with surgery as scheduled.   Willeen Cass, FNP-BC [facility] Short Stay Surgical Center/Anesthesiology Phone: [phone] 07/10/2016 2:10 PM

## 2016-07-05 DIAGNOSIS — S83206A Unspecified tear of unspecified meniscus, current injury, right knee, initial encounter: Secondary | ICD-10-CM | POA: Diagnosis present

## 2016-07-08 NOTE — Progress Notes (Signed)
.   Cardiology Office Note NEW PATIENT VISIT  Date:  07/09/2016   ID:  Justin Tate, DOB [birth year], MRN [id number]  PCP:  Lawerance Cruel, MD  Cardiologist:  Dr. Acie Fredrickson     Chief Complaint  Patient presents with  . Abnormal ECG  . Pre-op Exam      History of Present Illness: Justin Tate is a 71 y.o. male who is being seen today for the evaluation of pre-op cardiac risk for arthroscopy of Rt.knee and abnormal EKG at the request of Lawerance Cruel, MD./Dr. Mayer Camel -ortho/Anesthesia  Pt with hx of HTN, HLD sleep apnea- does not wear CPCP, he has had laser ablation of the Lt greater SV and improved stasis ulcers and deep venous reflux which is chronic.  EKG SR with 1st degree AV block and LBBB and PVCs with ventricular escape beats. 07/03/16 EKG with PCP 01/2016 with SR LBBB and PVC   Today he denies any chest pain and SOB with walking up steps.  No lightheadedness or dizziness.  He does have problems with ambulation due to Rt. Knee pain and Lt ankle pain.  The ankle has had infections and he takes Augmentin when this occurs. He wears support stockings most of the time and has them on today.  He usually walks with a cane but post op he will need walker.     PAD Screen 07/09/2016  Previous PAD dx? Yes  Previous surgical procedure? No  Pain with walking? Yes  Subsides with rest? Yes  Feet/toe relief with dangling? No  Painful, non-healing ulcers? No  Extremities discolored? No       Past Medical History:  Diagnosis Date  . Anemia   . Arthritis   . Cataracts, bilateral   . Colon polyps   . Depression   . Dyspnea   . Hyperlipidemia   . Hypertension   . Pneumonia    hx  . Sleep apnea    dx 25 yrs ago used cpap 12 yrs or so-  not used 6-7 yrs  . Varicose veins     Past Surgical History:  Procedure Laterality Date  . ENDOSCOPIC VEIN LASER TREATMENT    . TONSILLECTOMY       Current Outpatient Prescriptions  Medication Sig Dispense Refill  . acetaminophen  (TYLENOL) 650 MG CR tablet Take 1,300 mg by mouth every 8 (eight) hours as needed for pain.    Marland Kitchen amoxicillin-clavulanate (AUGMENTIN) 875-125 MG tablet Take 1 tablet by mouth 2 (two) times daily as needed (takes for infection in ankle as needed).    Marland Kitchen aspirin EC 81 MG tablet Take 81 mg by mouth daily.    Marland Kitchen buPROPion (WELLBUTRIN XL) 300 MG 24 hr tablet Take 300 mg by mouth daily.    . Cholecalciferol (VITAMIN D3) 2000 UNITS TABS Take 1 tablet by mouth daily.     . ferrous sulfate 325 (65 FE) MG tablet Take 325 mg by mouth daily with breakfast.    . Multiple Vitamin (MULTIVITAMIN) tablet Take 1 tablet by mouth daily.    . simvastatin (ZOCOR) 40 MG tablet Take 40 mg by mouth daily.     No current facility-administered medications for this visit.     Allergies:   Patient has no known allergies.    Social History:  The patient  reports that he has never smoked. He has never used smokeless tobacco. He reports that he does not drink alcohol or use drugs.   Family History:  The patient's family  history includes Cancer in his mother; Diabetes in his sister; Heart attack (age of onset: 2) in his father; Heart disease in his father; Nephrolithiasis in his father; Other in his mother.    ROS:  General:no colds or fevers, no weight changes Skin:no rashes or ulcers HEENT:no blurred vision, no congestion CV:see HPI PUL:see HPI GI:no diarrhea constipation or melena, no indigestion GU:no hematuria, no dysuria MS:+ rt knee and Lt ankle pain. no claudication Neuro:no syncope, no lightheadedness Endo:no diabetes, no thyroid disease  Wt Readings from Last 3 Encounters:  07/09/16 (!) 368 lb 9.6 oz (167.2 kg)  07/03/16 (!) 368 lb 2.7 oz (167 kg)  08/02/14 (!) 358 lb 4.8 oz (162.5 kg)     PHYSICAL EXAM: VS:  BP 124/88   Pulse 85   Ht 5\' 9"  (1.753 m)   Wt (!) 368 lb 9.6 oz (167.2 kg)   SpO2 93%   BMI 54.43 kg/m  , BMI Body mass index is 54.43 kg/m. General:Pleasant affect, NAD Skin:Warm and  dry, brisk capillary refill HEENT:normocephalic, sclera clear, mucus membranes moist Neck:Tate, no JVD, no bruits  Heart:S1S2 RRR without murmur, gallup, rub or click Lungs:clear without rales, rhonchi, or wheezes IRC:VELFY, soft, non tender, + BS, do not palpate liver spleen or masses Ext:no to trace lower ext edema, 1+ pedal pulses, 2+ radial pulses Neuro:alert and oriented X 3, MAE, follows commands, + facial symmetry    EKG:  EKG is ordered today. The ekg ordered today demonstrates SR with LBBB and PVCs.   Recent Labs: 07/03/2016: BUN 18; Creatinine, Ser 1.23; Hemoglobin 14.1; Platelets 210; Potassium 3.9; Sodium 139    Lipid Panel No results found for: CHOL, TRIG, HDL, CHOLHDL, VLDL, LDLCALC, LDLDIRECT  HDL 44 LDL 118 TChol 198 TG 183 TSH in Dec 2.750    Other studies Reviewed: Additional studies/ records that were reviewed today include: previous EKGs and labs.     ASSESSMENT AND PLAN:  1.  Pre-op exam for Rt knee arthroscopy - with abnormal EKG.  LBBB dates back to 01/2016  As do PVCs.  Labs stable but will check Echo and Mg+ and TSH.  If normal will proceed with surgery.  Also is normal no need for cardiology follow up unless Dr. Harrington Challenger prefers.  Dr. Acie Fredrickson has seen pt as well.  2.  Abnormal EKG with LBBB and PVCs.  Check echo  3.  PVCs since jan. Will check TSH and Mg+ K+ has been normal.     4. Morbid Obesity - he has decreased activity due to knee pain.  He hopes to get back to exercise post op and back on diet.  With diet alone he keeps wt stable but is not able to lose.   Current medicines are reviewed with the patient today.  The patient Has no concerns regarding medicines.  The following changes have been made:  See above Labs/ tests ordered today include:see above  Disposition:   FU:  see above  Signed, Cecilie Kicks, NP  07/09/2016 12:10 PM    [facility], [place], [place], [place] Phone: [phone]; Fax: [phone]   Attending Note:   The patient was seen and examined.  Agree with assessment and plan as noted above.  Changes made to the above note as needed.  Patient seen and independently examined with Cecilie Kicks, NP.   We discussed all aspects of the encounter. I agree  with the assessment and plan as stated above.  1.  Pre-op assessment :  Morbidly obese man. LBBB and PVCs on exam. No CP or shortness of breath. Has difficulty walking up stairs.  Will get echo ,  If he has normal LV function, he should be at low risk for his surgery    I have spent a total of 40 minutes with patient reviewing hospital  notes , telemetry, EKGs, labs and examining patient as well as establishing an assessment and plan that was discussed with the patient. > 50% of time was spent in direct patient care.    Thayer Headings, Brooke Bonito., MD, [facility] 07/10/2016, 8:05 AM [address]  [place] Pager [phone]

## 2016-07-09 ENCOUNTER — Ambulatory Visit (INDEPENDENT_AMBULATORY_CARE_PROVIDER_SITE_OTHER): Payer: Medicare HMO | Admitting: Cardiology

## 2016-07-09 ENCOUNTER — Encounter: Payer: Self-pay | Admitting: Cardiology

## 2016-07-09 VITALS — BP 124/88 | HR 85 | Ht 69.0 in | Wt 368.6 lb

## 2016-07-09 DIAGNOSIS — I493 Ventricular premature depolarization: Secondary | ICD-10-CM | POA: Diagnosis not present

## 2016-07-09 DIAGNOSIS — I447 Left bundle-branch block, unspecified: Secondary | ICD-10-CM

## 2016-07-09 DIAGNOSIS — Z01818 Encounter for other preprocedural examination: Secondary | ICD-10-CM

## 2016-07-09 DIAGNOSIS — R0602 Shortness of breath: Secondary | ICD-10-CM | POA: Diagnosis not present

## 2016-07-09 DIAGNOSIS — R9431 Abnormal electrocardiogram [ECG] [EKG]: Secondary | ICD-10-CM | POA: Diagnosis not present

## 2016-07-09 LAB — TSH: TSH: 2.25 u[IU]/mL (ref 0.450–4.500)

## 2016-07-09 LAB — MAGNESIUM: MAGNESIUM: 2.2 mg/dL (ref 1.6–2.3)

## 2016-07-09 NOTE — Patient Instructions (Addendum)
Medication Instructions:  Your physician recommends that you continue on your current medications as directed. Please refer to the Current Medication list given to you today.   Labwork: TODAY: TSH / Magnesium    Testing/Procedures: Your physician has requested that you have an echocardiogram. Echocardiography is a painless test that uses sound waves to create images of your heart. It provides your doctor with information about the size and shape of your heart and how well your heart's chambers and valves are working. This procedure takes approximately one hour. There are no restrictions for this procedure.-----TOMORROW (07/10/16) at 8:45 AM at [place]: Follow-up to be determined after results of echo received.     Any Other Special Instructions Will Be Listed Below (If Applicable).     If you need a refill on your cardiac medications before your next appointment, please call your pharmacy.

## 2016-07-10 ENCOUNTER — Ambulatory Visit (HOSPITAL_COMMUNITY)
Admission: RE | Admit: 2016-07-10 | Discharge: 2016-07-10 | Disposition: A | Discharge status: Home / Self Care | Payer: Medicare HMO | Source: Ambulatory Visit | Attending: Cardiology | Admitting: Cardiology

## 2016-07-10 DIAGNOSIS — R0602 Shortness of breath: Secondary | ICD-10-CM

## 2016-07-10 DIAGNOSIS — R9431 Abnormal electrocardiogram [ECG] [EKG]: Secondary | ICD-10-CM

## 2016-07-10 DIAGNOSIS — I517 Cardiomegaly: Secondary | ICD-10-CM | POA: Diagnosis not present

## 2016-07-10 MED ORDER — DEXTROSE 5 % IV SOLN
3.0000 g | INTRAVENOUS | Status: AC
  Administered 2016-07-11: 3 g via INTRAVENOUS
  Filled 2016-07-10: qty 3000

## 2016-07-10 NOTE — Progress Notes (Signed)
*   Echocardiogram 2D Echocardiogram has been performed.  Matilde Bash 07/10/2016, 9:40 AM

## 2016-07-11 ENCOUNTER — Ambulatory Visit (HOSPITAL_COMMUNITY): Payer: Medicare HMO | Admitting: Certified Registered Nurse Anesthetist

## 2016-07-11 ENCOUNTER — Observation Stay (HOSPITAL_COMMUNITY)
Admission: RE | Admit: 2016-07-11 | Discharge: 2016-07-12 | Disposition: A | Discharge status: Home / Self Care | Payer: Medicare HMO | Source: Ambulatory Visit | Attending: Orthopedic Surgery | Admitting: Orthopedic Surgery

## 2016-07-11 ENCOUNTER — Encounter (HOSPITAL_COMMUNITY): Payer: Self-pay | Admitting: *Deleted

## 2016-07-11 ENCOUNTER — Ambulatory Visit (HOSPITAL_COMMUNITY): Payer: Medicare HMO | Admitting: Emergency Medicine

## 2016-07-11 ENCOUNTER — Encounter (HOSPITAL_COMMUNITY)
Admission: RE | Disposition: A | Discharge status: Home / Self Care | Payer: Self-pay | Source: Ambulatory Visit | Attending: Orthopedic Surgery

## 2016-07-11 DIAGNOSIS — F329 Major depressive disorder, single episode, unspecified: Secondary | ICD-10-CM | POA: Diagnosis not present

## 2016-07-11 DIAGNOSIS — I10 Essential (primary) hypertension: Secondary | ICD-10-CM | POA: Insufficient documentation

## 2016-07-11 DIAGNOSIS — Z6841 Body Mass Index (BMI) 40.0 and over, adult: Secondary | ICD-10-CM | POA: Diagnosis not present

## 2016-07-11 DIAGNOSIS — M2241 Chondromalacia patellae, right knee: Secondary | ICD-10-CM | POA: Diagnosis not present

## 2016-07-11 DIAGNOSIS — I8390 Asymptomatic varicose veins of unspecified lower extremity: Secondary | ICD-10-CM | POA: Diagnosis not present

## 2016-07-11 DIAGNOSIS — S83206A Unspecified tear of unspecified meniscus, current injury, right knee, initial encounter: Secondary | ICD-10-CM

## 2016-07-11 DIAGNOSIS — Z9889 Other specified postprocedural states: Secondary | ICD-10-CM

## 2016-07-11 DIAGNOSIS — E782 Mixed hyperlipidemia: Secondary | ICD-10-CM | POA: Diagnosis not present

## 2016-07-11 DIAGNOSIS — E785 Hyperlipidemia, unspecified: Secondary | ICD-10-CM | POA: Insufficient documentation

## 2016-07-11 DIAGNOSIS — D649 Anemia, unspecified: Secondary | ICD-10-CM | POA: Diagnosis not present

## 2016-07-11 DIAGNOSIS — X58XXXA Exposure to other specified factors, initial encounter: Secondary | ICD-10-CM | POA: Insufficient documentation

## 2016-07-11 DIAGNOSIS — R69 Illness, unspecified: Secondary | ICD-10-CM | POA: Diagnosis not present

## 2016-07-11 DIAGNOSIS — M94261 Chondromalacia, right knee: Secondary | ICD-10-CM | POA: Diagnosis not present

## 2016-07-11 DIAGNOSIS — S83241A Other tear of medial meniscus, current injury, right knee, initial encounter: Principal | ICD-10-CM | POA: Insufficient documentation

## 2016-07-11 DIAGNOSIS — S83241D Other tear of medial meniscus, current injury, right knee, subsequent encounter: Secondary | ICD-10-CM | POA: Diagnosis not present

## 2016-07-11 DIAGNOSIS — M23231 Derangement of other medial meniscus due to old tear or injury, right knee: Secondary | ICD-10-CM | POA: Diagnosis not present

## 2016-07-11 DIAGNOSIS — M23331 Other meniscus derangements, other medial meniscus, right knee: Secondary | ICD-10-CM | POA: Diagnosis not present

## 2016-07-11 HISTORY — PX: KNEE ARTHROSCOPY: SHX127

## 2016-07-11 HISTORY — PX: KNEE ARTHROSCOPY: SUR90

## 2016-07-11 HISTORY — PX: MENISCUS DEBRIDEMENT: SHX5178

## 2016-07-11 HISTORY — PX: MENISECTOMY: SHX5181

## 2016-07-11 SURGERY — ARTHROSCOPY, KNEE
Anesthesia: General | Site: Knee | Laterality: Right

## 2016-07-11 MED ORDER — ACETAMINOPHEN 325 MG PO TABS: 650.0000 mg | ORAL_TABLET | Freq: Four times a day (QID) | ORAL | Status: DC | PRN

## 2016-07-11 MED ORDER — PHENYLEPHRINE HCL 10 MG/ML IJ SOLN
INTRAMUSCULAR | Status: DC | PRN
  Administered 2016-07-11: 160 ug via INTRAVENOUS
  Administered 2016-07-11: 120 ug via INTRAVENOUS
  Administered 2016-07-11: 160 ug via INTRAVENOUS
  Administered 2016-07-11 (×3): 120 ug via INTRAVENOUS

## 2016-07-11 MED ORDER — PHENYLEPHRINE 40 MCG/ML (10ML) SYRINGE FOR IV PUSH (FOR BLOOD PRESSURE SUPPORT)
PREFILLED_SYRINGE | INTRAVENOUS | Status: AC
  Filled 2016-07-11: qty 10

## 2016-07-11 MED ORDER — BUPIVACAINE-EPINEPHRINE (PF) 0.5% -1:200000 IJ SOLN
INTRAMUSCULAR | Status: AC
  Filled 2016-07-11: qty 30

## 2016-07-11 MED ORDER — ZOLPIDEM TARTRATE 5 MG PO TABS: 5.0000 mg | ORAL_TABLET | Freq: Every evening | ORAL | Status: DC | PRN

## 2016-07-11 MED ORDER — FENTANYL CITRATE (PF) 250 MCG/5ML IJ SOLN
INTRAMUSCULAR | Status: AC
  Filled 2016-07-11: qty 5

## 2016-07-11 MED ORDER — GLYCOPYRROLATE 0.2 MG/ML IJ SOLN
INTRAMUSCULAR | Status: DC | PRN
  Administered 2016-07-11: 0.2 mg via INTRAVENOUS

## 2016-07-11 MED ORDER — ARTIFICIAL TEARS OPHTHALMIC OINT
TOPICAL_OINTMENT | OPHTHALMIC | Status: DC | PRN
  Administered 2016-07-11: 1 via OPHTHALMIC

## 2016-07-11 MED ORDER — MIDAZOLAM HCL 2 MG/2ML IJ SOLN
INTRAMUSCULAR | Status: AC
  Filled 2016-07-11: qty 2

## 2016-07-11 MED ORDER — PROPOFOL 10 MG/ML IV BOLUS
INTRAVENOUS | Status: DC | PRN
  Administered 2016-07-11: 100 mg via INTRAVENOUS
  Administered 2016-07-11: 200 mg via INTRAVENOUS

## 2016-07-11 MED ORDER — ARTIFICIAL TEARS OPHTHALMIC OINT
TOPICAL_OINTMENT | OPHTHALMIC | Status: AC
  Filled 2016-07-11: qty 3.5

## 2016-07-11 MED ORDER — SUCCINYLCHOLINE CHLORIDE 20 MG/ML IJ SOLN
INTRAMUSCULAR | Status: DC | PRN
  Administered 2016-07-11: 160 mg via INTRAVENOUS

## 2016-07-11 MED ORDER — DEXTROSE-NACL 5-0.45 % IV SOLN: INTRAVENOUS | Status: DC

## 2016-07-11 MED ORDER — PROPOFOL 10 MG/ML IV BOLUS
INTRAVENOUS | Status: AC
  Filled 2016-07-11: qty 20

## 2016-07-11 MED ORDER — ASPIRIN EC 81 MG PO TBEC
81.0000 mg | DELAYED_RELEASE_TABLET | Freq: Every day | ORAL | Status: DC
  Administered 2016-07-11 – 2016-07-12 (×2): 81 mg via ORAL
  Filled 2016-07-11 (×2): qty 1

## 2016-07-11 MED ORDER — LACTATED RINGERS IV SOLN
INTRAVENOUS | Status: DC
  Administered 2016-07-11 (×2): via INTRAVENOUS

## 2016-07-11 MED ORDER — SIMVASTATIN 40 MG PO TABS
40.0000 mg | ORAL_TABLET | Freq: Every day | ORAL | Status: DC
  Administered 2016-07-11 – 2016-07-12 (×2): 40 mg via ORAL
  Filled 2016-07-11 (×2): qty 1

## 2016-07-11 MED ORDER — ONDANSETRON HCL 4 MG/2ML IJ SOLN
INTRAMUSCULAR | Status: DC | PRN
  Administered 2016-07-11: 4 mg via INTRAVENOUS

## 2016-07-11 MED ORDER — DIPHENHYDRAMINE HCL 50 MG/ML IJ SOLN: 12.5000 mg | Freq: Four times a day (QID) | INTRAMUSCULAR | Status: DC | PRN

## 2016-07-11 MED ORDER — LIDOCAINE HCL (CARDIAC) 20 MG/ML IV SOLN
INTRAVENOUS | Status: DC | PRN
  Administered 2016-07-11: 100 mg via INTRATRACHEAL

## 2016-07-11 MED ORDER — EPINEPHRINE PF 1 MG/ML IJ SOLN
INTRAMUSCULAR | Status: DC | PRN
  Administered 2016-07-11: 1 mg

## 2016-07-11 MED ORDER — ONE-DAILY MULTI VITAMINS PO TABS: 1.0000 | ORAL_TABLET | Freq: Every day | ORAL | Status: DC

## 2016-07-11 MED ORDER — HYDROCODONE-ACETAMINOPHEN 5-325 MG PO TABS
1.0000 | ORAL_TABLET | ORAL | Status: DC | PRN
  Administered 2016-07-12: 1 via ORAL
  Administered 2016-07-12: 2 via ORAL
  Filled 2016-07-11: qty 2
  Filled 2016-07-11: qty 1

## 2016-07-11 MED ORDER — PROMETHAZINE HCL 25 MG/ML IJ SOLN: 6.2500 mg | INTRAMUSCULAR | Status: DC | PRN

## 2016-07-11 MED ORDER — ONDANSETRON HCL 4 MG/2ML IJ SOLN
INTRAMUSCULAR | Status: AC
  Filled 2016-07-11: qty 2

## 2016-07-11 MED ORDER — FENTANYL CITRATE (PF) 250 MCG/5ML IJ SOLN
INTRAMUSCULAR | Status: DC | PRN
  Administered 2016-07-11: 100 ug via INTRAVENOUS

## 2016-07-11 MED ORDER — ACETAMINOPHEN 650 MG RE SUPP: 650.0000 mg | Freq: Four times a day (QID) | RECTAL | Status: DC | PRN

## 2016-07-11 MED ORDER — EPINEPHRINE PF 1 MG/ML IJ SOLN
INTRAMUSCULAR | Status: AC
  Filled 2016-07-11: qty 1

## 2016-07-11 MED ORDER — METHOCARBAMOL 500 MG PO TABS
500.0000 mg | ORAL_TABLET | Freq: Four times a day (QID) | ORAL | Status: DC | PRN
Start: 2016-07-11 — End: 2016-07-12

## 2016-07-11 MED ORDER — MIDAZOLAM HCL 2 MG/2ML IJ SOLN
INTRAMUSCULAR | Status: DC | PRN
  Administered 2016-07-11 (×2): 1 mg via INTRAVENOUS

## 2016-07-11 MED ORDER — ONDANSETRON HCL 4 MG/2ML IJ SOLN: 4.0000 mg | Freq: Four times a day (QID) | INTRAMUSCULAR | Status: DC | PRN

## 2016-07-11 MED ORDER — SODIUM CHLORIDE 0.9 % IR SOLN
Status: DC | PRN
  Administered 2016-07-11: 3000 mL

## 2016-07-11 MED ORDER — LIDOCAINE 2% (20 MG/ML) 5 ML SYRINGE
INTRAMUSCULAR | Status: AC
  Filled 2016-07-11: qty 5

## 2016-07-11 MED ORDER — FERROUS SULFATE 325 (65 FE) MG PO TABS
325.0000 mg | ORAL_TABLET | Freq: Every day | ORAL | Status: DC
  Administered 2016-07-12: 325 mg via ORAL
  Filled 2016-07-11: qty 1

## 2016-07-11 MED ORDER — DIPHENHYDRAMINE HCL 12.5 MG/5ML PO ELIX: 12.5000 mg | ORAL_SOLUTION | Freq: Four times a day (QID) | ORAL | Status: DC | PRN

## 2016-07-11 MED ORDER — CHLORHEXIDINE GLUCONATE 4 % EX LIQD: 60.0000 mL | Freq: Once | CUTANEOUS | Status: DC

## 2016-07-11 MED ORDER — ACETAMINOPHEN 10 MG/ML IV SOLN: 1000.0000 mg | Freq: Once | INTRAVENOUS | Status: DC | PRN

## 2016-07-11 MED ORDER — BUPROPION HCL ER (XL) 150 MG PO TB24
300.0000 mg | ORAL_TABLET | Freq: Every day | ORAL | Status: DC
Start: 2016-07-12 — End: 2016-07-12
  Administered 2016-07-12: 300 mg via ORAL
  Filled 2016-07-11: qty 2

## 2016-07-11 MED ORDER — ONDANSETRON 4 MG PO TBDP
4.0000 mg | ORAL_TABLET | Freq: Four times a day (QID) | ORAL | Status: DC | PRN
  Filled 2016-07-11: qty 1

## 2016-07-11 MED ORDER — ADULT MULTIVITAMIN W/MINERALS CH
1.0000 | ORAL_TABLET | Freq: Every day | ORAL | Status: DC
  Administered 2016-07-11 – 2016-07-12 (×2): 1 via ORAL
  Filled 2016-07-11 (×2): qty 1

## 2016-07-11 MED ORDER — VITAMIN D3 50 MCG (2000 UT) PO TABS: 1.0000 | ORAL_TABLET | Freq: Every day | ORAL | Status: DC

## 2016-07-11 MED ORDER — VITAMIN D 1000 UNITS PO TABS
2000.0000 [IU] | ORAL_TABLET | Freq: Every day | ORAL | Status: DC
  Administered 2016-07-11 – 2016-07-12 (×2): 2000 [IU] via ORAL
  Filled 2016-07-11 (×2): qty 2

## 2016-07-11 MED ORDER — FENTANYL CITRATE (PF) 100 MCG/2ML IJ SOLN: 25.0000 ug | INTRAMUSCULAR | Status: DC | PRN

## 2016-07-11 MED ORDER — BUPIVACAINE-EPINEPHRINE 0.5% -1:200000 IJ SOLN
INTRAMUSCULAR | Status: DC | PRN
  Administered 2016-07-11: 20 mL

## 2016-07-11 MED ORDER — SUCCINYLCHOLINE CHLORIDE 200 MG/10ML IV SOSY
PREFILLED_SYRINGE | INTRAVENOUS | Status: AC
  Filled 2016-07-11: qty 10

## 2016-07-11 MED ORDER — DEXTROSE-NACL 5-0.45 % IV SOLN
INTRAVENOUS | Status: DC
  Administered 2016-07-11: 16:00:00 via INTRAVENOUS

## 2016-07-11 SURGICAL SUPPLY — 32 items
BANDAGE ACE 6X5 VEL STRL LF (GAUZE/BANDAGES/DRESSINGS) ×2 IMPLANT
BLADE CUDA 5.5 (BLADE) IMPLANT
BLADE CUTTER GATOR 3.5 (BLADE) ×2 IMPLANT
BLADE GREAT WHITE 4.2 (BLADE) ×2 IMPLANT
BUR OVAL 6.0 (BURR) IMPLANT
DRAPE ARTHROSCOPY W/POUCH 114 (DRAPES) ×2 IMPLANT
DURAPREP 26ML APPLICATOR (WOUND CARE) ×2 IMPLANT
GAUZE SPONGE 4X4 12PLY STRL (GAUZE/BANDAGES/DRESSINGS) ×2 IMPLANT
GAUZE SPONGE 4X4 12PLY STRL LF (GAUZE/BANDAGES/DRESSINGS) ×2 IMPLANT
GAUZE XEROFORM 1X8 LF (GAUZE/BANDAGES/DRESSINGS) ×2 IMPLANT
GLOVE BIO SURGEON STRL SZ7.5 (GLOVE) ×2 IMPLANT
GLOVE BIO SURGEON STRL SZ8.5 (GLOVE) ×4 IMPLANT
GLOVE BIOGEL PI IND STRL 8 (GLOVE) ×2 IMPLANT
GLOVE BIOGEL PI IND STRL 9 (GLOVE) ×1 IMPLANT
GLOVE BIOGEL PI INDICATOR 8 (GLOVE) ×2
GLOVE BIOGEL PI INDICATOR 9 (GLOVE) ×1
GOWN STRL REUS W/ TWL LRG LVL3 (GOWN DISPOSABLE) ×3 IMPLANT
GOWN STRL REUS W/ TWL XL LVL3 (GOWN DISPOSABLE) ×2 IMPLANT
GOWN STRL REUS W/TWL LRG LVL3 (GOWN DISPOSABLE) ×3
GOWN STRL REUS W/TWL XL LVL3 (GOWN DISPOSABLE) ×2
KIT BASIN OR (CUSTOM PROCEDURE TRAY) ×2 IMPLANT
KIT ROOM TURNOVER OR (KITS) ×2 IMPLANT
MANIFOLD NEPTUNE II (INSTRUMENTS) ×2 IMPLANT
PACK ARTHROSCOPY DSU (CUSTOM PROCEDURE TRAY) ×2 IMPLANT
PAD ARMBOARD 7.5X6 YLW CONV (MISCELLANEOUS) ×4 IMPLANT
PAD CAST 4YDX4 CTTN HI CHSV (CAST SUPPLIES) ×1 IMPLANT
PADDING CAST COTTON 4X4 STRL (CAST SUPPLIES) ×1
SET ARTHROSCOPY TUBING (MISCELLANEOUS) ×1
SET ARTHROSCOPY TUBING LN (MISCELLANEOUS) ×1 IMPLANT
SPONGE LAP 4X18 X RAY DECT (DISPOSABLE) ×2 IMPLANT
TOWEL OR 17X24 6PK STRL BLUE (TOWEL DISPOSABLE) ×2 IMPLANT
WATER STERILE IRR 1000ML POUR (IV SOLUTION) ×2 IMPLANT

## 2016-07-11 NOTE — Anesthesia Preprocedure Evaluation (Signed)
Anesthesia Evaluation  Patient identified by MRN, date of birth, ID band Patient awake    Reviewed: Allergy & Precautions, NPO status , Patient's Chart, lab work & pertinent test results  Airway Mallampati: III  TM Distance: >3 FB Neck ROM: Full    Dental  (+) Dental Advisory Given   Pulmonary sleep apnea ,    breath sounds clear to auscultation       Cardiovascular hypertension, Pt. on medications + Peripheral Vascular Disease   Rhythm:Regular Rate:Normal  LBBB. Normal EF on 6/18 Echo. Cleared by cardiology at low risk.   Neuro/Psych Depression negative neurological ROS     GI/Hepatic negative GI ROS, Neg liver ROS,   Endo/Other  Morbid obesity  Renal/GU negative Renal ROS     Musculoskeletal  (+) Arthritis ,   Abdominal   Peds  Hematology negative hematology ROS (+)   Anesthesia Other Findings   Reproductive/Obstetrics                             Anesthesia Physical Anesthesia Plan  ASA: III  Anesthesia Plan: General   Post-op Pain Management:    Induction: Intravenous  PONV Risk Score and Plan: 2 and Ondansetron and Dexamethasone  Airway Management Planned: Oral ETT  Additional Equipment:   Intra-op Plan:   Post-operative Plan: Extubation in OR  Informed Consent: I have reviewed the patients History and Physical, chart, labs and discussed the procedure including the risks, benefits and alternatives for the proposed anesthesia with the patient or authorized representative who has indicated his/her understanding and acceptance.   Dental advisory given  Plan Discussed with: CRNA  Anesthesia Plan Comments:         Anesthesia Quick Evaluation

## 2016-07-11 NOTE — Evaluation (Signed)
Physical Therapy Evaluation Patient Details Name: Justin Tate MRN: [id number] DOB: [birth year] Today's Date: 07/11/2016   History of Present Illness  Pt is a 71 yo male s/p R arthroscopic knee surgery. PMH: morbid obesity (360 LBs, HTN, cataracts, sleep apnea.  Clinical Impression  Pt admitted with above. Pt functioning well considering body habitus and R knee surgery. ANticipate pt will recovery well and be able to d/c home alone with use of RW. PT to complete stair negotiation tomorrow.     Follow Up Recommendations No PT follow up;Supervision - Intermittent (may benefit from outpt PT s/p follow up appt.)    Equipment Recommendations  None recommended by PT (has bariatric walker)    Recommendations for Other Services       Precautions / Restrictions Precautions Precautions: Fall Restrictions Weight Bearing Restrictions: Yes RLE Weight Bearing: Weight bearing as tolerated      Mobility  Bed Mobility Overal bed mobility: Needs Assistance Bed Mobility: Supine to Sit     Supine to sit: Min guard     General bed mobility comments: HOB elevated and need to pull up on bed rail, labored effor due to body habitus  Transfers Overall transfer level: Needs assistance Equipment used: Rolling walker (2 wheeled) Transfers: Sit to/from Stand Sit to Stand: Min guard         General transfer comment: min guard to hold walker for "balance point" as pt reports he uses to get up  Ambulation/Gait Ambulation/Gait assistance: Min guard Ambulation Distance (Feet): 40 Feet Assistive device: Rolling walker (2 wheeled) Gait Pattern/deviations: Step-to pattern;Decreased stride length;Wide base of support Gait velocity: slow Gait velocity interpretation: Below normal speed for age/gender General Gait Details: due to body habitus pt with short step length  Stairs            Wheelchair Mobility    Modified Rankin (Stroke Patients Only)       Balance Overall balance  assessment: Needs assistance         Standing balance support: During functional activity Standing balance-Leahy Scale: Fair Standing balance comment: pt stood at [employer] and held onto railing to perform hygiene                             Pertinent Vitals/Pain Pain Assessment: 0-10 Pain Score: 1  Pain Location: R knee Pain Descriptors / Indicators: Sore Pain Intervention(s): Monitored during session    Home Living Family/patient expects to be discharged to:: Private residence Living Arrangements: Alone Available Help at Discharge: Family;Friend(s);Available PRN/intermittently Type of Home: House Home Access: Stairs to enter Entrance Stairs-Rails: Can reach both Entrance Stairs-Number of Steps: 3 Home Layout: One level Home Equipment: Walker - 2 wheels;Shower seat - built in;Grab bars - tub/shower;Grab bars - toilet;Hand held shower head      Prior Function Level of Independence: Independent         Comments: was driving, grocery shopping and tending to garden     Hand Dominance   Dominant Hand: Right    Extremity/Trunk Assessment   Upper Extremity Assessment Upper Extremity Assessment: Overall WFL for tasks assessed    Lower Extremity Assessment Lower Extremity Assessment: RLE deficits/detail RLE Deficits / Details: able to complete quad set and active R knee flexion to 90 deg    Cervical / Trunk Assessment Cervical / Trunk Assessment: Normal  Communication   Communication: No difficulties  Cognition Arousal/Alertness: Awake/alert Behavior During Therapy: WFL for tasks assessed/performed Overall Cognitive Status: Within  Functional Limits for tasks assessed                                        General Comments General comments (skin integrity, edema, etc.): pt with L ankle edema    Exercises General Exercises - Lower Extremity Ankle Circles/Pumps: AROM;Both;10 reps;Supine Quad Sets: AROM;Right;10 reps;Supine Long Arc  Quad: AROM;Right;10 reps;Seated   Assessment/Plan    PT Assessment Patient needs continued PT services  PT Problem List Decreased strength;Decreased activity tolerance;Decreased balance;Decreased mobility       PT Treatment Interventions DME instruction;Gait training;Stair training;Functional mobility training;Therapeutic activities;Balance training;Therapeutic exercise    PT Goals (Current goals can be found in the Care Plan section)  Acute Rehab PT Goals Patient Stated Goal: return home tomorrow PT Goal Formulation: With patient Time For Goal Achievement: 07/18/16 Potential to Achieve Goals: Good    Frequency 7X/week   Barriers to discharge Decreased caregiver support lives alone    Co-evaluation               AM-PAC PT "6 Clicks" Daily Activity  Outcome Measure Difficulty turning over in bed (including adjusting bedclothes, sheets and blankets)?: A Little Difficulty moving from lying on back to sitting on the side of the bed? : A Little Difficulty sitting down on and standing up from a chair with arms (e.g., wheelchair, bedside commode, etc,.)?: A Little Help needed moving to and from a bed to chair (including a wheelchair)?: A Little Help needed walking in hospital room?: A Little Help needed climbing 3-5 steps with a railing? : A Little 6 Click Score: 18    End of Session   Activity Tolerance: Patient tolerated treatment well Patient left: in chair;with call bell/phone within reach;with nursing/sitter in room Nurse Communication: Mobility status PT Visit Diagnosis: Difficulty in walking, not elsewhere classified (R26.2)    Time: 2992-4268 PT Time Calculation (min) (ACUTE ONLY): 26 min   Charges:   PT Evaluation $PT Eval Moderate Complexity: 1 Procedure PT Treatments $Gait Training: 8-22 mins   PT G Codes:   PT G-Codes **NOT FOR INPATIENT CLASS** Functional Assessment Tool Used: Clinical judgement Functional Limitation: Mobility: Walking and moving  around Mobility: Walking and Moving Around Current Status (T4196): At least 20 percent but less than 40 percent impaired, limited or restricted Mobility: Walking and Moving Around Goal Status [phone]): At least 1 percent but less than 20 percent impaired, limited or restricted    Kittie Plater, PT, DPT Pager #: [phone] Office #: [phone]   [place] 07/11/2016, 4:27 PM

## 2016-07-11 NOTE — Anesthesia Procedure Notes (Signed)
Procedure Name: Intubation Date/Time: 07/11/2016 11:53 AM Performed by: Suzette Battiest Pre-anesthesia Checklist: Patient identified, Emergency Drugs available, Suction available and Patient being monitored Patient Re-evaluated:Patient Re-evaluated prior to inductionOxygen Delivery Method: Circle system utilized Preoxygenation: Pre-oxygenation with 100% oxygen Intubation Type: IV induction Ventilation: Mask ventilation without difficulty Laryngoscope Size: Mac and 3 Grade View: Grade I Tube type: Oral Tube size: 7.0 mm Number of attempts: 1 Airway Equipment and Method: Stylet Placement Confirmation: ETT inserted through vocal cords under direct vision,  positive ETCO2 and breath sounds checked- equal and bilateral Secured at: 22 cm Tube secured with: Tape Dental Injury: Teeth and Oropharynx as per pre-operative assessment  Difficulty Due To: Difficulty was anticipated

## 2016-07-11 NOTE — Anesthesia Postprocedure Evaluation (Signed)
Anesthesia Post Note  Patient: Justin Tate  Procedure(s) Performed: Procedure(s) (LRB): ARTHROSCOPY KNEE (Right) PARTIAL MEDIAL MENISECTOMY (Right) DEBRIDEMENT OF Chondromalacia (Right)     Patient location during evaluation: PACU Anesthesia Type: General Level of consciousness: awake and alert Pain management: pain level controlled Vital Signs Assessment: post-procedure vital signs reviewed and stable Respiratory status: spontaneous breathing, nonlabored ventilation, respiratory function stable and patient connected to nasal cannula oxygen Cardiovascular status: blood pressure returned to baseline and stable Postop Assessment: no signs of nausea or vomiting Anesthetic complications: no    Last Vitals:  Vitals:   07/11/16 1339 07/11/16 1437  BP: 124/78   Pulse: 81   Resp: 18   Temp:  36.2 C    Last Pain:  Vitals:   07/11/16 1437  TempSrc:   PainSc: 0-No pain                 Tiajuana Amass

## 2016-07-11 NOTE — Transfer of Care (Signed)
Immediate Anesthesia Transfer of Care Note  Patient: Justin Tate  Procedure(s) Performed: Procedure(s): ARTHROSCOPY KNEE (Right) PARTIAL MEDIAL MENISECTOMY (Right) DEBRIDEMENT OF Chondromalacia (Right)  Patient Location: PACU  Anesthesia Type:General  Level of Consciousness: awake, alert  and patient cooperative  Airway & Oxygen Therapy: Patient Spontanous Breathing and Patient connected to face mask oxygen  Post-op Assessment: Report given to RN, Post -op Vital signs reviewed and stable, Patient moving all extremities X 4 and Patient able to stick tongue midline  Post vital signs: Reviewed and stable  Last Vitals:  Vitals:   07/11/16 1001 07/11/16 1253  BP: (!) 148/67   Pulse:    Resp:    Temp:  (P) 36.4 C    Last Pain:  Vitals:   07/11/16 0956  TempSrc: Oral         Complications: No apparent anesthesia complications

## 2016-07-11 NOTE — Interval H&P Note (Signed)
History and Physical Interval Note:  07/11/2016 11:38 AM  Justin Tate  has presented today for surgery, with the diagnosis of RIGHT KNEE MEDIAL MENISCAL TEAR  The various methods of treatment have been discussed with the patient and family. After consideration of risks, benefits and other options for treatment, the patient has consented to  Procedure(s): ARTHROSCOPY KNEE (Right) as a surgical intervention .  The patient's history has been reviewed, patient examined, no change in status, stable for surgery.  I have reviewed the patient's chart and labs.  Questions were answered to the patient's satisfaction.     Kerin Salen

## 2016-07-11 NOTE — Op Note (Addendum)
Pre-Op Dx: Right knee medial meniscal tear, chondromalacia  Postop Dx: Same   Procedure: Right knee partial arthroscopic medial meniscectomy and debridement chondromalacia from the medial femoral condyle grade 3 medial tibial plateau grade 3 and lateral trochlea and lateral facet of patella grade 3 to grade 4  Surgeon: Kathalene Frames. Mayer Camel M.D.  Assist: Hazel Sams PA-S  (present throughout entire procedure and necessary for timely completion of the procedure) Anes: General LMA  EBL: Minimal  Fluids: 800 cc   Indications: 71 year old man with a BMI in the high 53s with MRI proven medial meniscal tear. Pt has failed conservative treatment with anti-inflammatory medicines, physical therapy, and modified activites but did get good temporarily from an intra-articular cortisone injection. Pain has recurred and patient desires elective arthroscopic evaluation and treatment of knee. Risks and benefits of surgery have been discussed and questions answered.  Procedure: Patient identified by arm band and taken to the operating room at the day surgery Center. The appropriate anesthetic monitors were attached, and General LMA anesthesia was induced without difficulty. Lateral post was applied to the table and the lower extremity was prepped and draped in usual sterile fashion from the ankle to the midthigh. Time out procedure was performed. We began the operation by making standard inferior lateral and inferior medial peripatellar portals with a #11 blade allowing introduction of the arthroscope through the inferior lateral portal and the out flow to the inferior medial portal. Pump pressure was set at 100 mmHg and diagnostic arthroscopy  revealed grade 4 chondromalacia lateral facet of patella and lateral trochlea debrided back to stable margin with a 35 Gator sucker shaver. Medial compartment had some relatively minor tearing of the inner rim of the medial meniscus posterior medially debrided back to a stable margin  with 35 Gator sucker shaver was intact although slightly lax. Apartment was in good condition with minimal chondromalacia and minimal  generation of the inner rim of the meniscus lightly debrided with a 35 Gator sucker shaver. The knee was irrigated out normal saline solution. A dressing of xerofoam 4 x 4 dressing sponges, web roll and an Ace wrap was applied. The patient was awakened extubated and taken to the recovery without difficulty.    Signed: Kerin Salen, MD

## 2016-07-12 ENCOUNTER — Encounter (HOSPITAL_COMMUNITY): Payer: Self-pay | Admitting: Orthopedic Surgery

## 2016-07-12 DIAGNOSIS — S83241A Other tear of medial meniscus, current injury, right knee, initial encounter: Secondary | ICD-10-CM | POA: Diagnosis not present

## 2016-07-12 MED ORDER — HYDROCODONE-ACETAMINOPHEN 5-325 MG PO TABS
1.0000 | ORAL_TABLET | ORAL | 0 refills | Status: DC | PRN
Start: 2016-07-12 — End: 2016-11-09

## 2016-07-12 NOTE — Discharge Summary (Signed)
Patient ID: Justin Tate MRN: [id number] DOB/AGE: 70-May-1947 70 y.o.  Admit date: 07/11/2016 Discharge date: 07/12/2016  Admission Diagnoses:  Principal Problem:   Acute meniscal tear of right knee Active Problems:   Status post arthroscopy of right knee   Discharge Diagnoses:  Same  Past Medical History:  Diagnosis Date  . Anemia   . Arthritis   . Cataracts, bilateral   . Colon polyps   . Depression   . Dyspnea   . Hyperlipidemia   . Hypertension   . Pneumonia    hx  . Sleep apnea    dx 25 yrs ago used cpap 12 yrs or so-  not used 6-7 yrs  . Varicose veins     Surgeries: Procedure(s): ARTHROSCOPY KNEE PARTIAL MEDIAL MENISECTOMY DEBRIDEMENT OF Chondromalacia on 07/11/2016   Consultants:   Discharged Condition: Improved  Hospital Course: Justin Tate is an 71 y.o. male who was admitted 07/11/2016 for operative treatment ofAcute meniscal tear of right knee. Patient has severe unremitting pain that affects sleep, daily activities, and work/hobbies. After pre-op clearance the patient was taken to the operating room on 07/11/2016 and underwent  Procedure(s): ARTHROSCOPY KNEE PARTIAL MEDIAL MENISECTOMY DEBRIDEMENT OF Chondromalacia.    Patient was given perioperative antibiotics: Anti-infectives    Start     Dose/Rate Route Frequency Ordered Stop   07/11/16 1100  ceFAZolin (ANCEF) 3 g in dextrose 5 % 50 mL IVPB     3 g 130 mL/hr over 30 Minutes Intravenous On call to O.R. 07/10/16 1133 07/11/16 1154       Patient was given sequential compression devices, early ambulation, and chemoprophylaxis to prevent DVT. Patient was able to ambulate with a walker the evening of the surgical procedure, did well overnight. Was seen by physical therapy prior to discharge. Patient benefited maximally from hospital stay and there were no complications.    Recent vital signs: Patient Vitals for the past 24 hrs:  BP Temp Temp src Pulse Resp SpO2 Height Weight  07/12/16 0438 117/70 97.9  F (36.6 C) Oral 86 20 100 % - -  07/12/16 0137 119/62 97.8 F (36.6 C) Oral 77 20 98 % - -  07/11/16 2137 126/74 98.4 F (36.9 C) Oral 72 20 97 % - -  07/11/16 1500 - - - - - 96 % - -  07/11/16 1437 132/67 97.2 F (36.2 C) Oral 73 18 96 % - -  07/11/16 1339 124/78 - - 81 18 97 % - -  07/11/16 1330 - - - 84 19 99 % - -  07/11/16 1324 126/76 - - 82 16 99 % - -  07/11/16 1315 - - - 83 15 96 % - -  07/11/16 1309 124/68 - - 90 (!) 24 100 % - -  07/11/16 1300 - - - 86 (!) 30 100 % - -  07/11/16 1254 (!) 162/104 - - 93 20 97 % - -  07/11/16 1253 - 97.5 F (36.4 C) - - - - - -  07/11/16 1027 - - - - - - 5\' 9"  (1.753 m) (!) 166.9 kg (368 lb)  07/11/16 1001 (!) 148/67 - - - - - - -  07/11/16 0956 - 98.4 F (36.9 C) Oral 87 18 95 % - (!) 166.9 kg (368 lb)     Recent laboratory studies: No results for input(s): WBC, HGB, HCT, PLT, NA, K, CL, CO2, BUN, CREATININE, GLUCOSE, INR, CALCIUM in the last 72 hours.  Invalid input(s): PT, 2  Discharge Medications:   Allergies as of 07/12/2016      Reactions   No Known Allergies       Medication List    TAKE these medications   acetaminophen 650 MG CR tablet Commonly known as:  TYLENOL Take 1,300 mg by mouth every 8 (eight) hours as needed for pain.   amoxicillin-clavulanate 875-125 MG tablet Commonly known as:  AUGMENTIN Take 1 tablet by mouth 2 (two) times daily as needed (takes for infection in ankle as needed).   aspirin EC 81 MG tablet Take 81 mg by mouth daily.   buPROPion 300 MG 24 hr tablet Commonly known as:  WELLBUTRIN XL Take 300 mg by mouth daily.   ferrous sulfate 325 (65 FE) MG tablet Take 325 mg by mouth daily with breakfast.   HYDROcodone-acetaminophen 5-325 MG tablet Commonly known as:  NORCO/VICODIN Take 1 tablet by mouth every 4 (four) hours as needed for moderate pain.   multivitamin tablet Take 1 tablet by mouth daily.   simvastatin 40 MG tablet Commonly known as:  ZOCOR Take 40 mg by mouth daily.    Vitamin D3 2000 units Tabs Take 1 tablet by mouth daily.       Diagnostic Studies: Dg Chest 2 View  Result Date: 07/03/2016 CLINICAL DATA:  Preoperative respiratory evaluation prior to right knee arthroscopy on 07/11/2016. Current history of hypertension. EXAM: CHEST  2 VIEW COMPARISON:  11/09/2015, 06/10/2015. FINDINGS: Cardiac silhouette upper normal in size, unchanged. Thoracic aorta mildly tortuous and atherosclerotic, unchanged. Hilar and mediastinal contours otherwise unremarkable. Scarring in the left upper lobe, unchanged. Lungs otherwise clear. Bronchovascular markings normal. Pulmonary vascularity normal. No pleural effusions. Degenerative changes and DISH involving the thoracic spine. IMPRESSION: No acute cardiopulmonary disease. Electronically Signed   By: Evangeline Dakin M.D.   On: 07/03/2016 15:54    Disposition: 01-Home or Self Care  Discharge Instructions    Call MD / Call 911    Complete by:  As directed    If you experience chest pain or shortness of breath, CALL 911 and be transported to the hospital emergency room.  If you develope a fever above 101 F, pus (white drainage) or increased drainage or redness at the wound, or calf pain, call your surgeon's office.   Constipation Prevention    Complete by:  As directed    Drink plenty of fluids.  Prune juice may be helpful.  You may use a stool softener, such as Colace (over the counter) 100 mg twice a day.  Use MiraLax (over the counter) for constipation as needed.   Diet - low sodium heart healthy    Complete by:  As directed    Driving restrictions    Complete by:  As directed    No driving for 24 hours after anesthesia   Increase activity slowly as tolerated    Complete by:  As directed       Follow-up Information    Frederik Pear, MD Follow up.   Specialty:  Orthopedic Surgery Contact information: [place] [address] [phone]            Signed: Kerin Salen 07/12/2016, 6:33  AM

## 2016-07-12 NOTE — Progress Notes (Signed)
Removed IV, provided discharge education, all questions and concerns addressed, not in distress, discharged to home.

## 2016-07-12 NOTE — Discharge Instructions (Signed)
Knee Arthroscopy, Care After °Refer to this sheet in the next few weeks. These instructions provide you with information about caring for yourself after your procedure. Your health care provider may also give you more specific instructions. Your treatment has been planned according to current medical practices, but problems sometimes occur. Call your health care provider if you have any problems or questions after your procedure. °What can I expect after the procedure? °After the procedure, it is common to have: °· Soreness. °· Pain. ° °Follow these instructions at home: °Bathing °· Do not take baths, swim, or use a hot tub until your health care provider approves. °Incision care °· There are many different ways to close and cover an incision, including stitches, skin glue, and adhesive strips. Follow your health care provider’s instructions about: °? Incision care. °? Bandage (dressing) changes and removal. °? Incision closure removal. °· Check your incision area every day for signs of infection. Watch for: °? Redness, swelling, or pain. °? Fluid, blood, or pus. °Activity °· Avoid strenuous activities for as long as directed by your health care provider. °· Return to your normal activities as directed by your health care provider. Ask your health care provider what activities are safe for you. °· Perform range-of-motion exercises only as directed by your health care provider. °· Do not lift anything that is heavier than 10 lb (4.5 kg). °· Do not drive or operate heavy machinery while taking pain medicine. °· If you were given crutches, use them as directed by your health care provider. °Managing pain, stiffness, and swelling °· If directed, apply ice to the injured area: °? Put ice in a plastic bag. °? Place a towel between your skin and the bag. °? Leave the ice on for 20 minutes, 2-3 times per day. °· Raise the injured area above the level of your heart while you are sitting or lying down as directed by your  health care provider. °General instructions °· Keep all follow-up visits as directed by your health care provider. This is important. °· Take medicines only as directed by your health care provider. °· Do not use any tobacco products, including cigarettes, chewing tobacco, or electronic cigarettes. If you need help quitting, ask your health care provider. °· If you were given compression stockings, wear them as directed by your health care provider. These stockings help prevent blood clots and reduce swelling in your legs. °Contact a health care provider if: °· You have severe pain with any movement of your knee. °· You notice a bad smell coming from the incision or dressing. °· You have redness, swelling, or pain at the site of your incision. °· You have fluid, blood, or pus coming from your incision. °Get help right away if: °· You develop a rash. °· You have a fever. °· You have difficulty breathing or have shortness of breath. °· You develop pain in your calves or in the back of your knee. °· You develop chest pain. °· You develop numbness or tingling in your leg or foot. °This information is not intended to replace advice given to you by your health care provider. Make sure you discuss any questions you have with your health care provider. °Document Released: 07/28/2004 Document Revised: 06/10/2015 Document Reviewed: 01/04/2014 °Elsevier Interactive Patient Education © 2017 [employer] ° °

## 2016-07-12 NOTE — Progress Notes (Signed)
Subjective: 1 Day Post-Op Procedure(s) (LRB): ARTHROSCOPY KNEE (Right) PARTIAL MEDIAL MENISECTOMY (Right) DEBRIDEMENT OF Chondromalacia (Right) Patient reports pain as 2 on 0-10 scale.  Has been out of bed walking with assistance no trouble voiding or eating.   Objective: Vital signs in last 24 hours: Temp:  [97.2 F (36.2 C)-98.4 F (36.9 C)] 97.9 F (36.6 C) (06/21 0438) Pulse Rate:  [72-93] 86 (06/21 0438) Resp:  [15-30] 20 (06/21 0438) BP: (117-162)/(62-104) 117/70 (06/21 0438) SpO2:  [95 %-100 %] 100 % (06/21 0438) Weight:  [166.9 kg (368 lb)] 166.9 kg (368 lb) (06/20 1027)  Intake/Output from previous day: 06/20 0701 - 06/21 0700 In: 1838.8 [P.O.:240; I.V.:1598.8] Out: 26 [Urine:1; Blood:25] Intake/Output this shift: Total I/O In: -  Out: 1 [Urine:1]  No results for input(s): HGB in the last 72 hours. No results for input(s): WBC, RBC, HCT, PLT in the last 72 hours. No results for input(s): NA, K, CL, CO2, BUN, CREATININE, GLUCOSE, CALCIUM in the last 72 hours. No results for input(s): LABPT, INR in the last 72 hours.  Neurologically intact ABD soft Neurovascular intact Sensation intact distally Intact pulses distally Dorsiflexion/Plantar flexion intact Incision: no drainage No cellulitis present Compartment soft  Assessment/Plan: 1 Day Post-Op Procedure(s) (LRB): ARTHROSCOPY KNEE (Right) PARTIAL MEDIAL MENISECTOMY (Right) DEBRIDEMENT OF Chondromalacia (Right) Plan for discharge tomorrow, today after physical therapy, patient has walker at home to use. He will be given Norco for severe pain  Villa Burgin J 07/12/2016, 6:27 AM

## 2016-07-12 NOTE — Care Management Obs Status (Signed)
[facility] NOTIFICATION   Patient Details  Name: Justin Tate MRN: [id number] Date of Birth: 24-Jun-1945   Medicare Observation Status Notification Given:  Yes    Ella Bodo, RN 07/12/2016, 11:36 AM

## 2016-07-12 NOTE — Progress Notes (Signed)
Physical Therapy Treatment Patient Details Name: Justin Tate MRN: [id number] DOB: Apr 28, 1945 Today's Date: 07/12/2016    History of Present Illness Pt is a 71 yo male s/p R arthroscopic knee surgery. PMH: morbid obesity (360 LBs, HTN, cataracts, sleep apnea.    PT Comments    Pt very motivated and moving more today with increased ambulation and ability to perform stairs today. Pt educated for HEP and encouraged to continue mobility daily. Pt agreeable to therapy at home and states he may desire aide but unsure at this time. Pt clearly still with mobility limitations due to knee pain and body habitus. Will continue to follow acutely.     Follow Up Recommendations  Home health PT;Supervision - Intermittent     Equipment Recommendations  Rolling walker with 5" wheels (bariatric)    Recommendations for Other Services OT consult     Precautions / Restrictions Precautions Precautions: Fall Restrictions RLE Weight Bearing: Weight bearing as tolerated    Mobility  Bed Mobility               General bed mobility comments: in chair on arrival  Transfers Overall transfer level: Needs assistance   Transfers: Sit to/from Stand Sit to Stand: Min assist         General transfer comment: increased time, momentum and assist for anterior translation after multiple attempts to stand   Ambulation/Gait Ambulation/Gait assistance: Min guard Ambulation Distance (Feet): 200 Feet Assistive device: Rolling walker (2 wheeled) Gait Pattern/deviations: Step-through pattern;Decreased stride length;Trunk flexed   Gait velocity interpretation: Below normal speed for age/gender General Gait Details: cues for posture and position in RW   Stairs Stairs: Yes   Stair Management: Two rails;Step to pattern;Forwards Number of Stairs: 4 General stair comments: cues for sequence, pt very cautious and slow moving with stairs but able to perform with bil rails  Wheelchair Mobility     Modified Rankin (Stroke Patients Only)       Balance Overall balance assessment: Needs assistance   Sitting balance-Leahy Scale: Fair       Standing balance-Leahy Scale: Fair                              Cognition Arousal/Alertness: Awake/alert Behavior During Therapy: WFL for tasks assessed/performed Overall Cognitive Status: Within Functional Limits for tasks assessed                                        Exercises General Exercises - Lower Extremity Long Arc Quad: AROM;Right;10 reps;Seated Straight Leg Raises: AAROM;Right;Supine;10 reps Hip Flexion/Marching: AROM;Seated;10 reps;Both    General Comments        Pertinent Vitals/Pain Pain Score: 2  Pain Location: R knee Pain Descriptors / Indicators: Sore Pain Intervention(s): Limited activity within patient's tolerance;Monitored during session    Home Living                      Prior Function            PT Goals (current goals can now be found in the care plan section) Progress towards PT goals: Progressing toward goals    Frequency           PT Plan Discharge plan needs to be updated    Co-evaluation              AM-PAC  PT "6 Clicks" Daily Activity  Outcome Measure  Difficulty turning over in bed (including adjusting bedclothes, sheets and blankets)?: A Little Difficulty moving from lying on back to sitting on the side of the bed? : A Lot Difficulty sitting down on and standing up from a chair with arms (e.g., wheelchair, bedside commode, etc,.)?: Total Help needed moving to and from a bed to chair (including a wheelchair)?: A Little Help needed walking in hospital room?: A Little Help needed climbing 3-5 steps with a railing? : None 6 Click Score: 16    End of Session Equipment Utilized During Treatment: Gait belt Activity Tolerance: Patient tolerated treatment well Patient left: in chair;with call bell/phone within reach Nurse Communication:  Mobility status PT Visit Diagnosis: Difficulty in walking, not elsewhere classified (R26.2);Pain Pain - Right/Left: Right Pain - part of body: Knee     Time: 0753-0823 PT Time Calculation (min) (ACUTE ONLY): 30 min  Charges:  $Gait Training: 8-22 mins $Therapeutic Exercise: 8-22 mins                    G Codes:       Elwyn Reach, PT [phone]    [place] 07/12/2016, 8:29 AM

## 2016-07-12 NOTE — Care Management Note (Signed)
Case Management Note  Patient Details  Name: Justin Tate MRN: [id number] Date of Birth: 1945-10-01  Subjective/Objective: Pt admitted on 07/11/16 s/p Rt arthroscopic knee surgery.  PTA, pt independent and living at home alone.  Pt has bariatric RW for home use.              Action/Plan: PT recommending HH follow up; referral to [facility] for HHPT, per pt choice.  Start of care 24-48h post dc date.  Pt denies any additional needs.  He states he will have little/no support at home, but can manage.  He denies any additional needs for home.    Expected Discharge Date:  07/12/16               Expected Discharge Plan:  [place]  In-House Referral:     Discharge planning Services  CM Consult  Post Acute Care Choice:  Home Health Choice offered to:  Patient  DME Arranged:    DME Agency:     HH Arranged:  PT [place]:  [place]  Status of Service:  Completed, signed off  If discussed at [place] of Stay Meetings, dates discussed:    Additional Comments:  Reinaldo Raddle, RN, BSN  Trauma/Neuro ICU Case Manager [phone]

## 2016-07-13 DIAGNOSIS — D649 Anemia, unspecified: Secondary | ICD-10-CM | POA: Diagnosis not present

## 2016-07-13 DIAGNOSIS — I10 Essential (primary) hypertension: Secondary | ICD-10-CM | POA: Diagnosis not present

## 2016-07-13 DIAGNOSIS — S83206D Unspecified tear of unspecified meniscus, current injury, right knee, subsequent encounter: Secondary | ICD-10-CM | POA: Diagnosis not present

## 2016-07-13 DIAGNOSIS — H25093 Other age-related incipient cataract, bilateral: Secondary | ICD-10-CM | POA: Diagnosis not present

## 2016-07-17 DIAGNOSIS — I10 Essential (primary) hypertension: Secondary | ICD-10-CM | POA: Diagnosis not present

## 2016-07-17 DIAGNOSIS — H25093 Other age-related incipient cataract, bilateral: Secondary | ICD-10-CM | POA: Diagnosis not present

## 2016-07-17 DIAGNOSIS — D649 Anemia, unspecified: Secondary | ICD-10-CM | POA: Diagnosis not present

## 2016-07-17 DIAGNOSIS — S83206D Unspecified tear of unspecified meniscus, current injury, right knee, subsequent encounter: Secondary | ICD-10-CM | POA: Diagnosis not present

## 2016-07-18 DIAGNOSIS — R9431 Abnormal electrocardiogram [ECG] [EKG]: Secondary | ICD-10-CM | POA: Diagnosis not present

## 2016-07-18 DIAGNOSIS — R69 Illness, unspecified: Secondary | ICD-10-CM | POA: Diagnosis not present

## 2016-07-18 DIAGNOSIS — Z6841 Body Mass Index (BMI) 40.0 and over, adult: Secondary | ICD-10-CM | POA: Diagnosis not present

## 2016-07-18 DIAGNOSIS — G479 Sleep disorder, unspecified: Secondary | ICD-10-CM | POA: Diagnosis not present

## 2016-07-19 DIAGNOSIS — Z9889 Other specified postprocedural states: Secondary | ICD-10-CM | POA: Diagnosis not present

## 2016-07-20 DIAGNOSIS — I10 Essential (primary) hypertension: Secondary | ICD-10-CM | POA: Diagnosis not present

## 2016-07-20 DIAGNOSIS — H25093 Other age-related incipient cataract, bilateral: Secondary | ICD-10-CM | POA: Diagnosis not present

## 2016-07-20 DIAGNOSIS — D649 Anemia, unspecified: Secondary | ICD-10-CM | POA: Diagnosis not present

## 2016-07-20 DIAGNOSIS — S83206D Unspecified tear of unspecified meniscus, current injury, right knee, subsequent encounter: Secondary | ICD-10-CM | POA: Diagnosis not present

## 2016-07-23 DIAGNOSIS — S83206D Unspecified tear of unspecified meniscus, current injury, right knee, subsequent encounter: Secondary | ICD-10-CM | POA: Diagnosis not present

## 2016-07-23 DIAGNOSIS — D649 Anemia, unspecified: Secondary | ICD-10-CM | POA: Diagnosis not present

## 2016-07-23 DIAGNOSIS — H25093 Other age-related incipient cataract, bilateral: Secondary | ICD-10-CM | POA: Diagnosis not present

## 2016-07-23 DIAGNOSIS — I10 Essential (primary) hypertension: Secondary | ICD-10-CM | POA: Diagnosis not present

## 2016-07-26 DIAGNOSIS — I10 Essential (primary) hypertension: Secondary | ICD-10-CM | POA: Diagnosis not present

## 2016-07-26 DIAGNOSIS — S83206D Unspecified tear of unspecified meniscus, current injury, right knee, subsequent encounter: Secondary | ICD-10-CM | POA: Diagnosis not present

## 2016-07-26 DIAGNOSIS — D649 Anemia, unspecified: Secondary | ICD-10-CM | POA: Diagnosis not present

## 2016-07-26 DIAGNOSIS — H25093 Other age-related incipient cataract, bilateral: Secondary | ICD-10-CM | POA: Diagnosis not present

## 2016-08-06 DIAGNOSIS — H25093 Other age-related incipient cataract, bilateral: Secondary | ICD-10-CM | POA: Diagnosis not present

## 2016-08-06 DIAGNOSIS — I10 Essential (primary) hypertension: Secondary | ICD-10-CM | POA: Diagnosis not present

## 2016-08-06 DIAGNOSIS — D649 Anemia, unspecified: Secondary | ICD-10-CM | POA: Diagnosis not present

## 2016-08-06 DIAGNOSIS — S83206D Unspecified tear of unspecified meniscus, current injury, right knee, subsequent encounter: Secondary | ICD-10-CM | POA: Diagnosis not present

## 2016-08-16 DIAGNOSIS — M1711 Unilateral primary osteoarthritis, right knee: Secondary | ICD-10-CM | POA: Diagnosis not present

## 2016-08-16 DIAGNOSIS — S83241A Other tear of medial meniscus, current injury, right knee, initial encounter: Secondary | ICD-10-CM | POA: Diagnosis not present

## 2016-08-23 DIAGNOSIS — M25661 Stiffness of right knee, not elsewhere classified: Secondary | ICD-10-CM | POA: Diagnosis not present

## 2016-08-23 DIAGNOSIS — M25561 Pain in right knee: Secondary | ICD-10-CM | POA: Diagnosis not present

## 2016-08-23 DIAGNOSIS — M6281 Muscle weakness (generalized): Secondary | ICD-10-CM | POA: Diagnosis not present

## 2016-08-28 DIAGNOSIS — M25661 Stiffness of right knee, not elsewhere classified: Secondary | ICD-10-CM | POA: Diagnosis not present

## 2016-08-28 DIAGNOSIS — M6281 Muscle weakness (generalized): Secondary | ICD-10-CM | POA: Diagnosis not present

## 2016-08-28 DIAGNOSIS — M25561 Pain in right knee: Secondary | ICD-10-CM | POA: Diagnosis not present

## 2016-08-30 DIAGNOSIS — G4733 Obstructive sleep apnea (adult) (pediatric): Secondary | ICD-10-CM | POA: Diagnosis not present

## 2016-09-03 DIAGNOSIS — M25561 Pain in right knee: Secondary | ICD-10-CM | POA: Diagnosis not present

## 2016-09-03 DIAGNOSIS — M6281 Muscle weakness (generalized): Secondary | ICD-10-CM | POA: Diagnosis not present

## 2016-09-03 DIAGNOSIS — M25661 Stiffness of right knee, not elsewhere classified: Secondary | ICD-10-CM | POA: Diagnosis not present

## 2016-09-05 DIAGNOSIS — M25561 Pain in right knee: Secondary | ICD-10-CM | POA: Diagnosis not present

## 2016-09-05 DIAGNOSIS — M25661 Stiffness of right knee, not elsewhere classified: Secondary | ICD-10-CM | POA: Diagnosis not present

## 2016-09-05 DIAGNOSIS — M6281 Muscle weakness (generalized): Secondary | ICD-10-CM | POA: Diagnosis not present

## 2016-09-06 DIAGNOSIS — M1711 Unilateral primary osteoarthritis, right knee: Secondary | ICD-10-CM | POA: Diagnosis not present

## 2016-09-10 DIAGNOSIS — M6281 Muscle weakness (generalized): Secondary | ICD-10-CM | POA: Diagnosis not present

## 2016-09-10 DIAGNOSIS — M25561 Pain in right knee: Secondary | ICD-10-CM | POA: Diagnosis not present

## 2016-09-10 DIAGNOSIS — M25661 Stiffness of right knee, not elsewhere classified: Secondary | ICD-10-CM | POA: Diagnosis not present

## 2016-09-12 DIAGNOSIS — M25661 Stiffness of right knee, not elsewhere classified: Secondary | ICD-10-CM | POA: Diagnosis not present

## 2016-09-12 DIAGNOSIS — M6281 Muscle weakness (generalized): Secondary | ICD-10-CM | POA: Diagnosis not present

## 2016-09-12 DIAGNOSIS — M25561 Pain in right knee: Secondary | ICD-10-CM | POA: Diagnosis not present

## 2016-09-17 DIAGNOSIS — M6281 Muscle weakness (generalized): Secondary | ICD-10-CM | POA: Diagnosis not present

## 2016-09-17 DIAGNOSIS — M25661 Stiffness of right knee, not elsewhere classified: Secondary | ICD-10-CM | POA: Diagnosis not present

## 2016-09-17 DIAGNOSIS — M25561 Pain in right knee: Secondary | ICD-10-CM | POA: Diagnosis not present

## 2016-09-18 DIAGNOSIS — R32 Unspecified urinary incontinence: Secondary | ICD-10-CM | POA: Diagnosis not present

## 2016-09-18 DIAGNOSIS — C61 Malignant neoplasm of prostate: Secondary | ICD-10-CM | POA: Diagnosis not present

## 2016-09-19 DIAGNOSIS — M25561 Pain in right knee: Secondary | ICD-10-CM | POA: Diagnosis not present

## 2016-09-19 DIAGNOSIS — M6281 Muscle weakness (generalized): Secondary | ICD-10-CM | POA: Diagnosis not present

## 2016-09-19 DIAGNOSIS — M25661 Stiffness of right knee, not elsewhere classified: Secondary | ICD-10-CM | POA: Diagnosis not present

## 2016-10-01 DIAGNOSIS — M25661 Stiffness of right knee, not elsewhere classified: Secondary | ICD-10-CM | POA: Diagnosis not present

## 2016-10-01 DIAGNOSIS — M25561 Pain in right knee: Secondary | ICD-10-CM | POA: Diagnosis not present

## 2016-10-01 DIAGNOSIS — M6281 Muscle weakness (generalized): Secondary | ICD-10-CM | POA: Diagnosis not present

## 2016-10-09 DIAGNOSIS — M25561 Pain in right knee: Secondary | ICD-10-CM | POA: Diagnosis not present

## 2016-10-09 DIAGNOSIS — M25661 Stiffness of right knee, not elsewhere classified: Secondary | ICD-10-CM | POA: Diagnosis not present

## 2016-10-09 DIAGNOSIS — M6281 Muscle weakness (generalized): Secondary | ICD-10-CM | POA: Diagnosis not present

## 2016-10-14 DIAGNOSIS — K5900 Constipation, unspecified: Secondary | ICD-10-CM | POA: Diagnosis not present

## 2016-10-15 DIAGNOSIS — M25561 Pain in right knee: Secondary | ICD-10-CM | POA: Diagnosis not present

## 2016-10-15 DIAGNOSIS — M25661 Stiffness of right knee, not elsewhere classified: Secondary | ICD-10-CM | POA: Diagnosis not present

## 2016-10-15 DIAGNOSIS — M6281 Muscle weakness (generalized): Secondary | ICD-10-CM | POA: Diagnosis not present

## 2016-10-17 DIAGNOSIS — M25661 Stiffness of right knee, not elsewhere classified: Secondary | ICD-10-CM | POA: Diagnosis not present

## 2016-10-17 DIAGNOSIS — M25561 Pain in right knee: Secondary | ICD-10-CM | POA: Diagnosis not present

## 2016-10-17 DIAGNOSIS — M6281 Muscle weakness (generalized): Secondary | ICD-10-CM | POA: Diagnosis not present

## 2016-10-22 DIAGNOSIS — C61 Malignant neoplasm of prostate: Secondary | ICD-10-CM | POA: Diagnosis not present

## 2016-10-29 DIAGNOSIS — R351 Nocturia: Secondary | ICD-10-CM | POA: Diagnosis not present

## 2016-10-29 DIAGNOSIS — C61 Malignant neoplasm of prostate: Secondary | ICD-10-CM | POA: Diagnosis not present

## 2016-10-29 DIAGNOSIS — N401 Enlarged prostate with lower urinary tract symptoms: Secondary | ICD-10-CM | POA: Diagnosis not present

## 2016-11-01 DIAGNOSIS — Z23 Encounter for immunization: Secondary | ICD-10-CM | POA: Diagnosis not present

## 2016-11-01 DIAGNOSIS — G4733 Obstructive sleep apnea (adult) (pediatric): Secondary | ICD-10-CM | POA: Diagnosis not present

## 2016-11-09 ENCOUNTER — Emergency Department (HOSPITAL_COMMUNITY): Payer: Medicare HMO

## 2016-11-09 ENCOUNTER — Encounter (HOSPITAL_COMMUNITY): Payer: Self-pay | Admitting: Emergency Medicine

## 2016-11-09 ENCOUNTER — Inpatient Hospital Stay (HOSPITAL_COMMUNITY)
Admission: EM | Admit: 2016-11-09 | Discharge: 2016-11-13 | DRG: 280 | Disposition: A | Discharge status: Home Health | Payer: Medicare HMO | Attending: Internal Medicine | Admitting: Internal Medicine

## 2016-11-09 DIAGNOSIS — I361 Nonrheumatic tricuspid (valve) insufficiency: Secondary | ICD-10-CM | POA: Diagnosis not present

## 2016-11-09 DIAGNOSIS — I471 Supraventricular tachycardia: Secondary | ICD-10-CM | POA: Diagnosis not present

## 2016-11-09 DIAGNOSIS — R7989 Other specified abnormal findings of blood chemistry: Secondary | ICD-10-CM | POA: Diagnosis not present

## 2016-11-09 DIAGNOSIS — I214 Non-ST elevation (NSTEMI) myocardial infarction: Secondary | ICD-10-CM | POA: Diagnosis not present

## 2016-11-09 DIAGNOSIS — R0602 Shortness of breath: Secondary | ICD-10-CM | POA: Diagnosis not present

## 2016-11-09 DIAGNOSIS — R7303 Prediabetes: Secondary | ICD-10-CM | POA: Diagnosis present

## 2016-11-09 DIAGNOSIS — G4733 Obstructive sleep apnea (adult) (pediatric): Secondary | ICD-10-CM | POA: Diagnosis not present

## 2016-11-09 DIAGNOSIS — I5041 Acute combined systolic (congestive) and diastolic (congestive) heart failure: Secondary | ICD-10-CM | POA: Diagnosis not present

## 2016-11-09 DIAGNOSIS — N183 Chronic kidney disease, stage 3 (moderate): Secondary | ICD-10-CM | POA: Diagnosis present

## 2016-11-09 DIAGNOSIS — Z6841 Body Mass Index (BMI) 40.0 and over, adult: Secondary | ICD-10-CM | POA: Diagnosis not present

## 2016-11-09 DIAGNOSIS — I5032 Chronic diastolic (congestive) heart failure: Secondary | ICD-10-CM

## 2016-11-09 DIAGNOSIS — I878 Other specified disorders of veins: Secondary | ICD-10-CM | POA: Diagnosis present

## 2016-11-09 DIAGNOSIS — I21A1 Myocardial infarction type 2: Principal | ICD-10-CM | POA: Diagnosis present

## 2016-11-09 DIAGNOSIS — I480 Paroxysmal atrial fibrillation: Secondary | ICD-10-CM | POA: Diagnosis present

## 2016-11-09 DIAGNOSIS — I248 Other forms of acute ischemic heart disease: Secondary | ICD-10-CM | POA: Diagnosis not present

## 2016-11-09 DIAGNOSIS — N179 Acute kidney failure, unspecified: Secondary | ICD-10-CM | POA: Diagnosis not present

## 2016-11-09 DIAGNOSIS — I10 Essential (primary) hypertension: Secondary | ICD-10-CM | POA: Diagnosis not present

## 2016-11-09 DIAGNOSIS — I130 Hypertensive heart and chronic kidney disease with heart failure and stage 1 through stage 4 chronic kidney disease, or unspecified chronic kidney disease: Secondary | ICD-10-CM | POA: Diagnosis not present

## 2016-11-09 DIAGNOSIS — E785 Hyperlipidemia, unspecified: Secondary | ICD-10-CM | POA: Diagnosis present

## 2016-11-09 DIAGNOSIS — I440 Atrioventricular block, first degree: Secondary | ICD-10-CM | POA: Diagnosis not present

## 2016-11-09 DIAGNOSIS — R778 Other specified abnormalities of plasma proteins: Secondary | ICD-10-CM

## 2016-11-09 DIAGNOSIS — I739 Peripheral vascular disease, unspecified: Secondary | ICD-10-CM | POA: Diagnosis present

## 2016-11-09 DIAGNOSIS — I447 Left bundle-branch block, unspecified: Secondary | ICD-10-CM | POA: Diagnosis present

## 2016-11-09 DIAGNOSIS — R000 Tachycardia, unspecified: Secondary | ICD-10-CM | POA: Diagnosis not present

## 2016-11-09 DIAGNOSIS — I5033 Acute on chronic diastolic (congestive) heart failure: Secondary | ICD-10-CM

## 2016-11-09 HISTORY — DX: Acute kidney failure, unspecified: N17.9

## 2016-11-09 HISTORY — DX: Non-ST elevation (NSTEMI) myocardial infarction: I21.4

## 2016-11-09 HISTORY — DX: Chronic diastolic (congestive) heart failure: I50.32

## 2016-11-09 HISTORY — DX: Acute on chronic diastolic (congestive) heart failure: I50.33

## 2016-11-09 LAB — CBC WITH DIFFERENTIAL/PLATELET
BASOS PCT: 0 %
Basophils Absolute: 0 10*3/uL (ref 0.0–0.1)
EOS ABS: 0 10*3/uL (ref 0.0–0.7)
EOS PCT: 0 %
HCT: 45.6 % (ref 39.0–52.0)
Hemoglobin: 14.9 g/dL (ref 13.0–17.0)
LYMPHS ABS: 1.2 10*3/uL (ref 0.7–4.0)
Lymphocytes Relative: 9 %
MCH: 29.3 pg (ref 26.0–34.0)
MCHC: 32.7 g/dL (ref 30.0–36.0)
MCV: 89.6 fL (ref 78.0–100.0)
MONO ABS: 0.7 10*3/uL (ref 0.1–1.0)
MONOS PCT: 6 %
Neutro Abs: 11.2 10*3/uL — ABNORMAL HIGH (ref 1.7–7.7)
Neutrophils Relative %: 85 %
PLATELETS: 233 10*3/uL (ref 150–400)
RBC: 5.09 MIL/uL (ref 4.22–5.81)
RDW: 14.2 % (ref 11.5–15.5)
WBC: 13.2 10*3/uL — ABNORMAL HIGH (ref 4.0–10.5)

## 2016-11-09 LAB — BRAIN NATRIURETIC PEPTIDE: B NATRIURETIC PEPTIDE 5: 656.2 pg/mL — AB (ref 0.0–100.0)

## 2016-11-09 LAB — BASIC METABOLIC PANEL
ANION GAP: 15 (ref 5–15)
BUN: 24 mg/dL — ABNORMAL HIGH (ref 6–20)
CALCIUM: 8.8 mg/dL — AB (ref 8.9–10.3)
CO2: 19 mmol/L — AB (ref 22–32)
CREATININE: 2.11 mg/dL — AB (ref 0.61–1.24)
Chloride: 106 mmol/L (ref 101–111)
GFR, EST AFRICAN AMERICAN: 35 mL/min — AB (ref >60)
GFR, EST NON AFRICAN AMERICAN: 30 mL/min — AB (ref >60)
GLUCOSE: 167 mg/dL — AB (ref 65–99)
Potassium: 4 mmol/L (ref 3.5–5.1)
Sodium: 140 mmol/L (ref 135–145)

## 2016-11-09 LAB — HEPATIC FUNCTION PANEL
ALBUMIN: 3.3 g/dL — AB (ref 3.5–5.0)
ALK PHOS: 64 U/L (ref 38–126)
ALT: 30 U/L (ref 17–63)
AST: 63 U/L — AB (ref 15–41)
BILIRUBIN TOTAL: 0.6 mg/dL (ref 0.3–1.2)
Bilirubin, Direct: <0.1 mg/dL — ABNORMAL LOW (ref 0.1–0.5)
TOTAL PROTEIN: 6.7 g/dL (ref 6.5–8.1)

## 2016-11-09 LAB — HEPARIN LEVEL (UNFRACTIONATED): HEPARIN UNFRACTIONATED: 0.2 [IU]/mL — AB (ref 0.30–0.70)

## 2016-11-09 LAB — GASTRIC OCCULT BLOOD (1-CARD TO LAB): Occult Blood, Gastric: NEGATIVE

## 2016-11-09 LAB — HEMOGLOBIN A1C
HEMOGLOBIN A1C: 6.3 % — AB (ref 4.8–5.6)
Mean Plasma Glucose: 134.11 mg/dL

## 2016-11-09 LAB — MRSA PCR SCREENING: MRSA BY PCR: NEGATIVE

## 2016-11-09 LAB — TROPONIN I
TROPONIN I: 3.77 ng/mL — AB (ref <0.03)
TROPONIN I: 8.36 ng/mL — AB (ref <0.03)
Troponin I: 0.62 ng/mL (ref <0.03)

## 2016-11-09 LAB — I-STAT TROPONIN, ED: TROPONIN I, POC: 0.48 ng/mL — AB (ref 0.00–0.08)

## 2016-11-09 LAB — POCT GASTRIC OCCULT BLOOD (1-CARD TO LAB): PH, GASTRIC: 1

## 2016-11-09 MED ORDER — ATORVASTATIN CALCIUM 80 MG PO TABS
80.0000 mg | ORAL_TABLET | Freq: Every day | ORAL | Status: DC
  Administered 2016-11-10 – 2016-11-12 (×3): 80 mg via ORAL
  Filled 2016-11-09 (×2): qty 1

## 2016-11-09 MED ORDER — NITROGLYCERIN IN D5W 200-5 MCG/ML-% IV SOLN
0.0000 ug/min | INTRAVENOUS | Status: DC
  Administered 2016-11-09: 5 ug/min via INTRAVENOUS
  Filled 2016-11-09: qty 250

## 2016-11-09 MED ORDER — ASPIRIN EC 81 MG PO TBEC
81.0000 mg | DELAYED_RELEASE_TABLET | Freq: Every day | ORAL | Status: DC
  Administered 2016-11-10 – 2016-11-11 (×2): 81 mg via ORAL
  Filled 2016-11-09 (×2): qty 1

## 2016-11-09 MED ORDER — NITROGLYCERIN 0.4 MG SL SUBL: 0.4000 mg | SUBLINGUAL_TABLET | SUBLINGUAL | Status: DC | PRN

## 2016-11-09 MED ORDER — AMIODARONE HCL IN DEXTROSE 360-4.14 MG/200ML-% IV SOLN
30.0000 mg/h | INTRAVENOUS | Status: DC
  Administered 2016-11-09 (×2): 30 mg/h via INTRAVENOUS
  Filled 2016-11-09 (×4): qty 200

## 2016-11-09 MED ORDER — ASPIRIN 81 MG PO CHEW
324.0000 mg | CHEWABLE_TABLET | ORAL | Status: AC
  Administered 2016-11-09: 324 mg via ORAL
  Filled 2016-11-09: qty 4

## 2016-11-09 MED ORDER — SODIUM CHLORIDE 0.9% FLUSH
3.0000 mL | Freq: Two times a day (BID) | INTRAVENOUS | Status: DC
  Administered 2016-11-09 – 2016-11-11 (×5): 3 mL via INTRAVENOUS

## 2016-11-09 MED ORDER — HEPARIN (PORCINE) IN NACL 100-0.45 UNIT/ML-% IJ SOLN
2400.0000 [IU]/h | INTRAMUSCULAR | Status: DC
  Administered 2016-11-09: 1350 [IU]/h via INTRAVENOUS
  Administered 2016-11-10: 1600 [IU]/h via INTRAVENOUS
  Administered 2016-11-10: 1800 [IU]/h via INTRAVENOUS
  Administered 2016-11-11: 2000 [IU]/h via INTRAVENOUS
  Administered 2016-11-11: 1800 [IU]/h via INTRAVENOUS
  Filled 2016-11-09 (×7): qty 250

## 2016-11-09 MED ORDER — ONDANSETRON HCL 4 MG/2ML IJ SOLN: 4.0000 mg | Freq: Four times a day (QID) | INTRAMUSCULAR | Status: DC | PRN

## 2016-11-09 MED ORDER — ALPRAZOLAM 0.25 MG PO TABS: 0.2500 mg | ORAL_TABLET | Freq: Two times a day (BID) | ORAL | Status: DC | PRN

## 2016-11-09 MED ORDER — FERROUS SULFATE 325 (65 FE) MG PO TABS
325.0000 mg | ORAL_TABLET | Freq: Every day | ORAL | Status: DC
  Administered 2016-11-10 – 2016-11-13 (×4): 325 mg via ORAL
  Filled 2016-11-09 (×4): qty 1

## 2016-11-09 MED ORDER — VITAMIN D 1000 UNITS PO TABS
2000.0000 [IU] | ORAL_TABLET | Freq: Every day | ORAL | Status: DC
  Administered 2016-11-09 – 2016-11-13 (×5): 2000 [IU] via ORAL
  Filled 2016-11-09 (×6): qty 2

## 2016-11-09 MED ORDER — SODIUM CHLORIDE 0.9 % IV SOLN
INTRAVENOUS | Status: DC
  Administered 2016-11-09: 09:00:00 via INTRAVENOUS

## 2016-11-09 MED ORDER — BUPROPION HCL ER (XL) 300 MG PO TB24
300.0000 mg | ORAL_TABLET | Freq: Every day | ORAL | Status: DC
  Administered 2016-11-09 – 2016-11-13 (×5): 300 mg via ORAL
  Filled 2016-11-09: qty 2
  Filled 2016-11-09 (×2): qty 1
  Filled 2016-11-09 (×2): qty 2

## 2016-11-09 MED ORDER — SODIUM CHLORIDE 0.9% FLUSH: 3.0000 mL | INTRAVENOUS | Status: DC | PRN

## 2016-11-09 MED ORDER — SODIUM CHLORIDE 0.9 % IV SOLN: INTRAVENOUS | Status: DC

## 2016-11-09 MED ORDER — AMIODARONE HCL IN DEXTROSE 360-4.14 MG/200ML-% IV SOLN
60.0000 mg/h | INTRAVENOUS | Status: AC
  Administered 2016-11-09: 60 mg/h via INTRAVENOUS
  Filled 2016-11-09: qty 200

## 2016-11-09 MED ORDER — AMIODARONE LOAD VIA INFUSION
150.0000 mg | Freq: Once | INTRAVENOUS | Status: AC
  Administered 2016-11-09: 150 mg via INTRAVENOUS
  Filled 2016-11-09: qty 83.34

## 2016-11-09 MED ORDER — ASPIRIN 300 MG RE SUPP: 300.0000 mg | RECTAL | Status: AC

## 2016-11-09 MED ORDER — MIRABEGRON ER 25 MG PO TB24
25.0000 mg | ORAL_TABLET | Freq: Every day | ORAL | Status: DC
  Administered 2016-11-09 – 2016-11-13 (×5): 25 mg via ORAL
  Filled 2016-11-09 (×5): qty 1

## 2016-11-09 MED ORDER — HEPARIN BOLUS VIA INFUSION
4000.0000 [IU] | Freq: Once | INTRAVENOUS | Status: AC
  Administered 2016-11-09: 4000 [IU] via INTRAVENOUS
  Filled 2016-11-09: qty 4000

## 2016-11-09 MED ORDER — ADENOSINE 6 MG/2ML IV SOLN
INTRAVENOUS | Status: AC
  Filled 2016-11-09: qty 2

## 2016-11-09 MED ORDER — ZOLPIDEM TARTRATE 5 MG PO TABS: 5.0000 mg | ORAL_TABLET | Freq: Every evening | ORAL | Status: DC | PRN

## 2016-11-09 MED ORDER — SODIUM CHLORIDE 0.9 % IV SOLN: 250.0000 mL | INTRAVENOUS | Status: DC | PRN

## 2016-11-09 MED ORDER — ONDANSETRON HCL 4 MG/2ML IJ SOLN
4.0000 mg | Freq: Four times a day (QID) | INTRAMUSCULAR | Status: DC | PRN
  Administered 2016-11-09: 4 mg via INTRAVENOUS

## 2016-11-09 MED ORDER — ADULT MULTIVITAMIN W/MINERALS CH
1.0000 | ORAL_TABLET | Freq: Every day | ORAL | Status: DC
  Administered 2016-11-09 – 2016-11-13 (×5): 1 via ORAL
  Filled 2016-11-09 (×5): qty 1

## 2016-11-09 MED ORDER — ACETAMINOPHEN 325 MG PO TABS: 650.0000 mg | ORAL_TABLET | ORAL | Status: DC | PRN

## 2016-11-09 NOTE — ED Notes (Signed)
Date and time results received: 11/09/16  (use smartphrase ".now" to insert current time)  Test:  TROP Critical Value: 3.77  Name of Provider Notified: RHONDA BARRETT CARDIO PA  Orders Received? Or Actions Taken?: Actions Taken: PA WILL Leanna Sato MD. AWAITING REPSONSE

## 2016-11-09 NOTE — ED Notes (Signed)
CARDIO HILTY MD AWARE OF ELEVATED TROP 3.77. AWARE

## 2016-11-09 NOTE — ED Notes (Signed)
[facility] PRESENT TRANSFERRING PT. PAIN 0/10.

## 2016-11-09 NOTE — ED Notes (Signed)
UPDATED ON SPECIALITY BED.

## 2016-11-09 NOTE — ED Notes (Signed)
CARDIO Provider at bedside. MADE AWARE OF BED STATUS. CHARGE STACEY W RN CALLED TAMMY RN HOUSE AC ASSISTING WITH STATUS AT [facility] BED STATUS. PT AND CARDIO MD MADE AWARE OF CURRENT STATUS.

## 2016-11-09 NOTE — Progress Notes (Signed)
CRITICAL VALUE ALERT  Critical value received:  Troponin 8.36   Date of notification:  11/09/16  Time of notification:  1903   Critical value read back:Yes.    Nurse who received alert:  Janee Morn, RN   MD notified (1st page):  Jettie Booze, NP   Time of first page:  1903   Responding MD:  Jettie Booze, NP  Time MD responded:  [phone]

## 2016-11-09 NOTE — Progress Notes (Signed)
ANTICOAGULATION CONSULT NOTE - Initial Consult  Pharmacy Consult for IV heparin Indication: chest pain/ACS  Allergies  Allergen Reactions  . No Known Allergies     Patient Measurements: Height: 5\' 9"  (175.3 cm) Weight: (!) 375 lb (170.1 kg) IBW/kg (Calculated) : 70.7 Heparin Dosing Weight: 113  Vital Signs: Temp: 98 F (36.7 C) (10/19 0915) Temp Source: Oral (10/19 0915) BP: 142/79 (10/19 0915) Pulse Rate: 83 (10/19 0915)  Labs:  Recent Labs  11/09/16 0341 11/09/16 0754  HGB 14.9  --   HCT 45.6  --   PLT 233  --   CREATININE 2.11*  --   TROPONINI 0.62* 3.77*    Estimated Creatinine Clearance: 50.9 mL/min (A) (by C-G formula based on SCr of 2.11 mg/dL (H)).   Medical History: Past Medical History:  Diagnosis Date  . Acute on chronic diastolic CHF (congestive heart failure), NYHA class 4 ([place]) 11/09/2016  . AKI (acute kidney injury) ([place]) 11/09/2016  . Anemia   . Arthritis   . Cataracts, bilateral   . Colon polyps   . Depression   . Dyspnea   . Hyperlipidemia   . Hypertension   . NSTEMI (non-ST elevated myocardial infarction) ([place]) 11/09/2016  . Pneumonia    hx  . Sleep apnea    dx 25 yrs ago used cpap 12 yrs or so-  not used 6-7 yrs  . Varicose veins     Medications:  Scheduled:   Infusions:  . amiodarone 60 mg/hr (11/09/16 0925)   Followed by  . amiodarone    . nitroGLYCERIN      Assessment: 71 yo male with NSTEMI to start IV heparin per Rx. Baseline labs drawn  Goal of Therapy:  Heparin level 0.3-0.7 units/ml Monitor platelets by anticoagulation protocol: Yes   Plan:  1) IV heparin 4000 unit bolus then 2) IV heparin 1350 units/hr 3) Check 8 hour heparin level 4) Daily CBC and heparin level   Adrian Saran, PharmD, BCPS Pager [phone] 11/09/2016 10:32 AM

## 2016-11-09 NOTE — ED Notes (Signed)
[place] RN NOTIFIED OF TROPONIN 8.36. PT WITHOUT CHEST PAIN. NERI RN WILL NOTIFY CARDIO MD. AWARE PT IS IN TRANSPORT TO [facility].

## 2016-11-09 NOTE — ED Notes (Signed)
NORMAL SALINE AT [facility]

## 2016-11-09 NOTE — ED Notes (Signed)
ED TO INPATIENT HANDOFF REPORT  Name/Age/Gender Justin Tate 71 y.o. male  Code Status Code Status History    Date Active Date Inactive Code Status Order ID Comments User Context   07/11/2016  2:54 PM 07/12/2016  4:11 PM Full Code [id number]  Frederik Pear, MD Inpatient      Home/SNF/Other Home  Chief Complaint chest pain  Level of Care/Admitting Diagnosis ED Disposition    ED Disposition Condition [place]: [place] [[id number]]  Level of Care: Stepdown [14]  Diagnosis: Tachyarrhythmia [725366]  Admitting Physician: Marcie Mowers [[id number]]  Attending Physician: Sueanne Margarita [phone]  Estimated length of stay: past midnight tomorrow  Certification:: I certify this patient will need inpatient services for at least 2 midnights  PT Class (Do Not Modify): Inpatient [101]  PT Acc Code (Do Not Modify): Private [1]       Medical History Past Medical History:  Diagnosis Date  . Acute on chronic diastolic CHF (congestive heart failure), NYHA class 4 ([place]) 11/09/2016  . AKI (acute kidney injury) ([place]) 11/09/2016  . Anemia   . Arthritis   . Cataracts, bilateral   . Chronic diastolic CHF (congestive heart failure), NYHA class 3 ([place]) 11/09/2016  . Colon polyps   . Depression   . Dyspnea   . Hyperlipidemia   . Hypertension   . NSTEMI (non-ST elevated myocardial infarction) ([place]) 11/09/2016  . Pneumonia    hx  . Sleep apnea    dx 25 yrs ago used cpap 12 yrs or so-  not used 6-7 yrs  . Varicose veins     Allergies Allergies  Allergen Reactions  . No Known Allergies     IV Location/Drains/Wounds Patient Lines/Drains/Airways Status   Active Line/Drains/Airways    Name:   Placement date:   Placement time:   Site:   Days:   Peripheral IV 11/09/16 Left Antecubital  11/09/16    0331    Antecubital    less than 1   Peripheral IV 11/09/16 Left Wrist  11/09/16    1103    Wrist    less than 1   External Urinary Catheter   11/09/16    1519        less than 1   Incision (Closed) 07/11/16 Knee Right  07/11/16    1240      121          Labs/Imaging Results for orders placed or performed during the hospital encounter of 11/09/16 (from the past 48 hour(s))  I-Stat Troponin, ED (not at [facility])     Status: Abnormal   Collection Time: 11/09/16  3:37 AM  Result Value Ref Range   Troponin i, poc 0.48 (HH) 0.00 - 0.08 ng/mL   Comment NOTIFIED PHYSICIAN    Comment 3            Comment: Due to the release kinetics of cTnI, a negative result within the first hours of the onset of symptoms does not rule out myocardial infarction with certainty. If myocardial infarction is still suspected, repeat the test at appropriate intervals.   Basic metabolic panel     Status: Abnormal   Collection Time: 11/09/16  3:41 AM  Result Value Ref Range   Sodium 140 135 - 145 mmol/L   Potassium 4.0 3.5 - 5.1 mmol/L   Chloride 106 101 - 111 mmol/L   CO2 19 (L) 22 - 32 mmol/L   Glucose, Bld 167 (H) 65 - 99  mg/dL   BUN 24 (H) 6 - 20 mg/dL   Creatinine, Ser 2.11 (H) 0.61 - 1.24 mg/dL   Calcium 8.8 (L) 8.9 - 10.3 mg/dL   GFR calc non Af Amer 30 (L) >60 mL/min   GFR calc Af Amer 35 (L) >60 mL/min    Comment: (NOTE) The eGFR has been calculated using the CKD EPI equation. This calculation has not been validated in all clinical situations. eGFR's persistently <60 mL/min signify possible Chronic Kidney Disease.    Anion gap 15 5 - 15  Troponin I     Status: Abnormal   Collection Time: 11/09/16  3:41 AM  Result Value Ref Range   Troponin I 0.62 (HH) <0.03 ng/mL    Comment: CRITICAL RESULT CALLED TO, READ BACK BY AND VERIFIED WITH: Cathe Mons [id number] @ 0416 BY J SCOTTON   CBC with Differential/Platelet     Status: Abnormal   Collection Time: 11/09/16  3:41 AM  Result Value Ref Range   WBC 13.2 (H) 4.0 - 10.5 K/uL   RBC 5.09 4.22 - 5.81 MIL/uL   Hemoglobin 14.9 13.0 - 17.0 g/dL   HCT 45.6 39.0 - 52.0 %   MCV 89.6 78.0 - 100.0  fL   MCH 29.3 26.0 - 34.0 pg   MCHC 32.7 30.0 - 36.0 g/dL   RDW 14.2 11.5 - 15.5 %   Platelets 233 150 - 400 K/uL   Neutrophils Relative % 85 %   Neutro Abs 11.2 (H) 1.7 - 7.7 K/uL   Lymphocytes Relative 9 %   Lymphs Abs 1.2 0.7 - 4.0 K/uL   Monocytes Relative 6 %   Monocytes Absolute 0.7 0.1 - 1.0 K/uL   Eosinophils Relative 0 %   Eosinophils Absolute 0.0 0.0 - 0.7 K/uL   Basophils Relative 0 %   Basophils Absolute 0.0 0.0 - 0.1 K/uL  Troponin I     Status: Abnormal   Collection Time: 11/09/16  7:54 AM  Result Value Ref Range   Troponin I 3.77 (HH) <0.03 ng/mL    Comment: DELTA CHECK NOTED REPEATED TO VERIFY CRITICAL RESULT CALLED TO, READ BACK BY AND VERIFIED WITH: Marita Burnsed,A. RN AT 3903 11/09/16 MULLINS,T   POCT gastric occult blood(1-card to lab)     Status: None   Collection Time: 11/09/16  9:54 AM  Result Value Ref Range   pH, Gastric 1    Occult Blood, Gastric NEGATIVE NEGATIVE   Dg Chest Port 1 View  Result Date: 11/09/2016 CLINICAL DATA:  Heart palpitations and shortness of breath. EXAM: PORTABLE CHEST 1 VIEW COMPARISON:  07/03/2016 FINDINGS: Shallow inspiration. Normal heart size and pulmonary vascularity. No focal airspace disease or consolidation in the lungs. No blunting of costophrenic angles. No pneumothorax. Mediastinal contours appear intact. IMPRESSION: No active disease. Electronically Signed   By: Lucienne Capers M.D.   On: 11/09/2016 04:27    Pending Labs Unresulted Labs    Start     Ordered   11/10/16 0500  CBC  Daily,   R     11/09/16 1036   11/09/16 1900  Heparin level (unfractionated)  Once-Timed,   R     11/09/16 1036   Signed and Held  Occult blood card to lab, stool  As needed,   R     Signed and Held   Signed and Held  Hepatic function panel  Once,   R     Signed and Held   Signed and Held  Troponin I  Now then  every 6 hours,   STAT     Signed and Held   Signed and Held  Hemoglobin A1c  Once,   R     Signed and Held   Signed and Held   Brain natriuretic peptide  Once,   R     Signed and Held   Signed and Held  Basic metabolic panel  Daily,   R     Signed and Held      Vitals/Pain Today's Vitals   11/09/16 1521 11/09/16 1600 11/09/16 1630 11/09/16 1645  BP:  123/67 127/66   Pulse:  72 71 69  Resp:  19  19  Temp:      TempSrc:      SpO2:  99% 100% 99%  Weight:      Height:      PainSc: 0-No pain       Isolation Precautions No active isolations  Medications Medications  amiodarone (NEXTERONE) 1.8 mg/mL load via infusion 150 mg (150 mg Intravenous Bolus from Bag 11/09/16 0911)    Followed by  amiodarone (NEXTERONE PREMIX) 360-4.14 MG/200ML-% (1.8 mg/mL) IV infusion (0 mg/hr Intravenous Stopped 11/09/16 1525)    Followed by  amiodarone (NEXTERONE PREMIX) 360-4.14 MG/200ML-% (1.8 mg/mL) IV infusion (30 mg/hr Intravenous New Bag/Given 11/09/16 1516)  nitroGLYCERIN 50 mg in dextrose 5 % 250 mL (0.2 mg/mL) infusion (5 mcg/min Intravenous New Bag/Given 11/09/16 1108)  heparin ADULT infusion 100 units/mL (25000 units/271m sodium chloride 0.45%) (1,350 Units/hr Intravenous Canceled Entry 11/09/16 1133)  0.9 %  sodium chloride infusion ( Intravenous New Bag/Given 11/09/16 0900)  aspirin EC tablet 81 mg (not administered)  buPROPion (WELLBUTRIN XL) 24 hr tablet 300 mg (300 mg Oral Given 11/09/16 1625)  cholecalciferol (VITAMIN D) tablet 2,000 Units (2,000 Units Oral Given 11/09/16 1625)  ferrous sulfate tablet 325 mg (not administered)  multivitamin with minerals tablet 1 tablet (1 tablet Oral Given 11/09/16 1625)  mirabegron ER (MYRBETRIQ) tablet 25 mg (25 mg Oral Given 11/09/16 1625)  heparin bolus via infusion 4,000 Units (4,000 Units Intravenous Bolus from Bag 11/09/16 1138)    Mobility {Mobility:20148 O

## 2016-11-09 NOTE — ED Triage Notes (Signed)
Pt states about 2 hours ago he got up to go into the kitchen and noticed he was having a hard time using his right arm and felt like he was having a hard time standing  Pt states he then started feeling short of breath and felt like his heart was racing  Pt states he sat down for a little bit and it felt like it started to slow down but with any movement he would get the same feeling  Pt states he has had some pain in his right shoulder and arm  Denies chest pain

## 2016-11-09 NOTE — ED Notes (Signed)
PT CHANGED TO THE SPECIALITY BED. STAFF X 5 ASSISTED WITH TRANSFER. PT TOLERATED

## 2016-11-09 NOTE — ED Provider Notes (Addendum)
[facility] DEPT Provider Note: Georgena Spurling, MD, FACEP  CSN: [id number] MRN: [id number] ARRIVAL: 11/09/16 at [place]: RESA/RESA   CHIEF COMPLAINT  Tachycardia   HISTORY OF PRESENT ILLNESS  11/09/16 3:12 AM Justin Tate is a 71 y.o. male with a history of morbid obesity and left bundle branch block. He got up to use the bathroom about an hour and a half prior to arrival when he felt a aching discomfort in his right shoulder which he rated as a 7 out of 10. He also realized that he was dyspneic and that his heart was racing. He denies associated chest pain. On arrival, in triage, the monitor appeared to show a rapid, wide complex rhythm with rate of 212. An attempt to obtain a 12-lead was unsuccessful, but rate was confirmed with a rhythm strip.   He was rushed back to the resuscitation room where his heart rate was noted to be 120. EKG showed a wide complex tachycardia with frequent ectopy. As his rate slowed became evident that the baseline rhythm was sinus with a significant first-degree AV block with superimposed PVCs.  He was diaphoretic and pale on initial arrival to the resuscitation room but this improved with oxygen and as his heart rate slowed. His shoulder pain resolved as well.    Past Medical History:  Diagnosis Date  . Anemia   . Arthritis   . Cataracts, bilateral   . Colon polyps   . Depression   . Dyspnea   . Hyperlipidemia   . Hypertension   . Pneumonia    hx  . Sleep apnea    dx 25 yrs ago used cpap 12 yrs or so-  not used 6-7 yrs  . Varicose veins     Past Surgical History:  Procedure Laterality Date  . ENDOSCOPIC VEIN LASER TREATMENT    . KNEE ARTHROSCOPY Right 07/11/2016  . KNEE ARTHROSCOPY Right 07/11/2016   Procedure: ARTHROSCOPY KNEE;  Surgeon: Frederik Pear, MD;  Location: [place];  Service: Orthopedics;  Laterality: Right;  . MENISCUS DEBRIDEMENT Right 07/11/2016   Procedure: DEBRIDEMENT OF Chondromalacia;  Surgeon: Frederik Pear, MD;  Location:  [place];  Service: Orthopedics;  Laterality: Right;  . MENISECTOMY Right 07/11/2016   Procedure: PARTIAL MEDIAL MENISECTOMY;  Surgeon: Frederik Pear, MD;  Location: [place];  Service: Orthopedics;  Laterality: Right;  . TONSILLECTOMY      Family History  Problem Relation Age of Onset  . Cancer Mother        colon  . Other Mother        varicose veins  . Heart disease Father   . Heart attack Father 68  . Nephrolithiasis Father   . Diabetes Sister        type 1    Social History  Substance Use Topics  . Smoking status: Never Smoker  . Smokeless tobacco: Never Used  . Alcohol use No    Prior to Admission medications   Medication Sig Start Date End Date Taking? Authorizing Provider  acetaminophen (TYLENOL) 650 MG CR tablet Take 1,300 mg by mouth every 8 (eight) hours as needed for pain.   Yes [provider]  amoxicillin-clavulanate (AUGMENTIN) 875-125 MG tablet Take 1 tablet by mouth 2 (two) times daily as needed (takes for infection in ankle as needed).   Yes [provider]  aspirin EC 81 MG tablet Take 81 mg by mouth daily.   Yes [provider]  buPROPion (WELLBUTRIN XL) 300 MG 24 hr tablet  Take 300 mg by mouth daily.   Yes [provider]  Cholecalciferol (VITAMIN D3) 2000 UNITS TABS Take 1 tablet by mouth daily.    Yes [provider]  ferrous sulfate 325 (65 FE) MG tablet Take 325 mg by mouth daily with breakfast.   Yes [provider]  meloxicam (MOBIC) 15 MG tablet Take 15 mg by mouth daily. with food 10/03/16  Yes [provider]  Multiple Vitamin (MULTIVITAMIN) tablet Take 1 tablet by mouth daily.   Yes [provider]  MYRBETRIQ 25 MG TB24 tablet Take 25 mg by mouth daily. 10/16/16  Yes [provider]  simvastatin (ZOCOR) 40 MG tablet Take 40 mg by mouth daily.   Yes [provider]    Allergies No known allergies   REVIEW OF SYSTEMS  Negative except as noted here or in the History  of Present Illness.   PHYSICAL EXAMINATION  Initial Vital Signs Blood pressure 117/66, pulse (!) 130, temperature 98.5 F (36.9 C), temperature source Oral, resp. rate (!) 26, SpO2 100 %.  Examination General: Well-developed, morbidly obese male in no acute distress; appearance consistent with age of record HENT: normocephalic; atraumatic Eyes: pupils equal, round and reactive to light; extraocular muscles intact Neck: supple Heart: tachycardia with frequent ectopy Lungs: clear to auscultation bilaterally Abdomen: soft; obese; nontender; bowel sounds present Extremities: No deformity; full range of motion; 3+ edema of lower legs Neurologic: Awake, alert and oriented; motor function intact in all extremities and symmetric; no facial droop Skin: pale; clammy Psychiatric: anxious   RESULTS  Summary of this visit's results, reviewed by myself:   EKG Interpretation  Date/Time:  Friday November 09 2016 03:16:17 EDT Ventricular Rate:  125 PR Interval:    QRS Duration: 151 QT Interval:  358 QTC Calculation: 517 R Axis:   -74 Text Interpretation:  Undetermined rhythm Ventricular tachycardia, unsustained Left bundle branch block Previously sinus rhythm with PVCs Rate is faster Confirmed by Penelopi Mikrut [phone]) on 11/09/2016 3:22:19 AM       EKG Interpretation  Date/Time:  Friday November 09 2016 03:29:33 EDT Ventricular Rate:  96 PR Interval:    QRS Duration: 162 QT Interval:  448 QTC Calculation: 567 R Axis:   -76 Text Interpretation:  Sinus rhythm with first degree AV block Multiple ventricular premature complexes Left bundle branch block Rate is slower Confirmed by Cordelia Bessinger [phone]) on 11/09/2016 3:35:16 AM        Laboratory Studies: Results for orders placed or performed during the hospital encounter of 11/09/16 (from the past 24 hour(s))  I-Stat Troponin, ED (not at [facility])     Status: Abnormal   Collection Time: 11/09/16  3:37 AM  Result Value Ref Range   Troponin  i, poc 0.48 (HH) 0.00 - 0.08 ng/mL   Comment NOTIFIED PHYSICIAN    Comment 3          Basic metabolic panel     Status: Abnormal   Collection Time: 11/09/16  3:41 AM  Result Value Ref Range   Sodium 140 135 - 145 mmol/L   Potassium 4.0 3.5 - 5.1 mmol/L   Chloride 106 101 - 111 mmol/L   CO2 19 (L) 22 - 32 mmol/L   Glucose, Bld 167 (H) 65 - 99 mg/dL   BUN 24 (H) 6 - 20 mg/dL   Creatinine, Ser 2.11 (H) 0.61 - 1.24 mg/dL   Calcium 8.8 (L) 8.9 - 10.3 mg/dL   GFR calc non Af Amer 30 (L) >60  mL/min   GFR calc Af Amer 35 (L) >60 mL/min   Anion gap 15 5 - 15  Troponin I     Status: Abnormal   Collection Time: 11/09/16  3:41 AM  Result Value Ref Range   Troponin I 0.62 (HH) <0.03 ng/mL  CBC with Differential/Platelet     Status: Abnormal   Collection Time: 11/09/16  3:41 AM  Result Value Ref Range   WBC 13.2 (H) 4.0 - 10.5 K/uL   RBC 5.09 4.22 - 5.81 MIL/uL   Hemoglobin 14.9 13.0 - 17.0 g/dL   HCT 45.6 39.0 - 52.0 %   MCV 89.6 78.0 - 100.0 fL   MCH 29.3 26.0 - 34.0 pg   MCHC 32.7 30.0 - 36.0 g/dL   RDW 14.2 11.5 - 15.5 %   Platelets 233 150 - 400 K/uL   Neutrophils Relative % 85 %   Neutro Abs 11.2 (H) 1.7 - 7.7 K/uL   Lymphocytes Relative 9 %   Lymphs Abs 1.2 0.7 - 4.0 K/uL   Monocytes Relative 6 %   Monocytes Absolute 0.7 0.1 - 1.0 K/uL   Eosinophils Relative 0 %   Eosinophils Absolute 0.0 0.0 - 0.7 K/uL   Basophils Relative 0 %   Basophils Absolute 0.0 0.0 - 0.1 K/uL   Imaging Studies: Dg Chest Port 1 View  Result Date: 11/09/2016 CLINICAL DATA:  Heart palpitations and shortness of breath. EXAM: PORTABLE CHEST 1 VIEW COMPARISON:  07/03/2016 FINDINGS: Shallow inspiration. Normal heart size and pulmonary vascularity. No focal airspace disease or consolidation in the lungs. No blunting of costophrenic angles. No pneumothorax. Mediastinal contours appear intact. IMPRESSION: No active disease. Electronically Signed   By: Lucienne Capers M.D.   On: 11/09/2016 04:27    ED COURSE   Nursing notes and initial vitals signs, including pulse oximetry, reviewed.  Vitals:   11/09/16 0302 11/09/16 0400 11/09/16 0430  BP: 117/66 122/73 119/86  Pulse: (!) 130 90 88  Resp: (!) 26 (!) 22 19  Temp: 98.5 F (36.9 C)    TempSrc: Oral    SpO2: 100% 95% 97%   4:37 AM Troponin elevated. I suspect that this represents demand ischemia rather than a myocardial infarction given the patient's severe tachycardia on presentation. His tachycardia was wide complex but given hisknown left bundle branch block it is unclear if this represented ventricular tachycardia, supraventricular tachycardia or atrial fibrillation with rapid ventricular response.  6:51 AM Patient has remained stable and asymptomatic. He is now awaiting transfer to the [facility] cardiology service.  7:09 AM Patient had a run of what appears to be 9 beats of ventricular tachycardia at 7 AM. We'll go ahead and load with amiodarone while he awaits transfer to [facility].  PROCEDURES    ED DIAGNOSES     ICD-10-CM   1. Tachyarrhythmia R00.0   2. Elevated troponin I level R74.8        Shanon Rosser, MD 11/09/16 2992    Shanon Rosser, MD 11/09/16 [phone]

## 2016-11-09 NOTE — H&P (Signed)
Cardiology History and Physical:   Patient ID: Justin Tate; [id number]; 31-Dec-1945   Admit date: 11/09/2016 Date of Consult: 11/09/2016  Primary Care Provider: Lawerance Cruel, MD Primary Cardiologist: Dr Acie Fredrickson Primary Electrophysiologist:  n/a   Patient Profile:   Justin Tate is a 71 y.o. male with a hx of HTN, HLD, OSA s/p ablation L greater SV & improved>>now is going back on it, venous stasis ulcers w/ deep venous reflux, LBBB, OA, morbid obesity, who is being seen today for the evaluation of CP, elevated troponin and arrhythmia at the request of Dr Florina Ou.  History of Present Illness:   Justin Tate was seen by Dr Acie Fredrickson and Cecilie Kicks 07/09/2016 preop for R knee arthroscopy and did well.   Since the surgery, he has been working on increasing his activity. He tries to walk around the house, has not been out much. He is using a walker. Does not stick to a low-Na diet. Chronic LE edema, orthopnea, denies PND. +Nocturia. Doest not weigh daily. Felt respiratory status was at baseline yesterday.   He went to bed early, about 6 pm. He woke about 9 pm, got up and started moving around. No initial symptoms. When he started moving around, he started panting and then noticed his heart racing. He tried to slow his breathing down and eventually was . He became nauseated and weak. His R arm felt very weak and he was having pain (aching) in his R shoulder at 7/10 as well.   He finally decided to come in when symptoms did not resolve. He drove himself but got very weak walking in the door. He felt better with rest.   He got better when O2 was added. He was not really aware when his HR/rhythm. He was initially in a WCT, HR 125 that was regular. He also had some rapid atrial fib. His HR slowed downafter amiodarone was added.  After his amiodarone bolus, he had an episode of nausea and vomiting. The vomiting was brownish and is being sent for [facility]astro[facility].he stated he was not  surprised that he had vomited, he has done this a couple of times in the last few weeks. He thinks that it was from laying down and then sitting up.       Past Medical History:  Diagnosis Date  . Acute on chronic diastolic CHF (congestive heart failure), NYHA class 4 ([place]) 11/09/2016  . AKI (acute kidney injury) ([place]) 11/09/2016  . Anemia   . Arthritis   . Cataracts, bilateral   . Colon polyps   . Depression   . Dyspnea   . Hyperlipidemia   . Hypertension   . NSTEMI (non-ST elevated myocardial infarction) ([place]) 11/09/2016  . Pneumonia    hx  . Sleep apnea    dx 25 yrs ago used cpap 12 yrs or so-  not used 6-7 yrs  . Varicose veins          Past Surgical History:  Procedure Laterality Date  . ENDOSCOPIC VEIN LASER TREATMENT    . KNEE ARTHROSCOPY Right 07/11/2016  . KNEE ARTHROSCOPY Right 07/11/2016   Procedure: ARTHROSCOPY KNEE;  Surgeon: Frederik Pear, MD;  Location: [place];  Service: Orthopedics;  Laterality: Right;  . MENISCUS DEBRIDEMENT Right 07/11/2016   Procedure: DEBRIDEMENT OF Chondromalacia;  Surgeon: Frederik Pear, MD;  Location: [place];  Service: Orthopedics;  Laterality: Right;  . MENISECTOMY Right 07/11/2016   Procedure: PARTIAL MEDIAL MENISECTOMY;  Surgeon: Frederik Pear, MD;  Location: [place];  Service: Orthopedics;  Laterality: Right;  . TONSILLECTOMY       Inpatient Medications: Scheduled Meds: Continuous Infusions: . amiodarone 60 mg/hr (11/09/16 0925)   Followed by  . amiodarone     PRN Meds:         Prior to Admission medications   Medication Sig Start Date End Date Taking? Authorizing Provider  acetaminophen (TYLENOL) 650 MG CR tablet Take 1,300 mg by mouth every 8 (eight) hours as needed for pain.   Yes [provider]  amoxicillin-clavulanate (AUGMENTIN) 875-125 MG tablet Take 1 tablet by mouth 2 (two) times daily as needed (takes for infection in ankle as needed).   Yes [provider]  aspirin EC 81  MG tablet Take 81 mg by mouth daily.   Yes [provider]  buPROPion (WELLBUTRIN XL) 300 MG 24 hr tablet Take 300 mg by mouth daily.   Yes [provider]  Cholecalciferol (VITAMIN D3) 2000 UNITS TABS Take 1 tablet by mouth daily.    Yes [provider]  ferrous sulfate 325 (65 FE) MG tablet Take 325 mg by mouth daily with breakfast.   Yes [provider]  meloxicam (MOBIC) 15 MG tablet Take 15 mg by mouth daily. with food 10/03/16  Yes [provider]  Multiple Vitamin (MULTIVITAMIN) tablet Take 1 tablet by mouth daily.   Yes [provider]  MYRBETRIQ 25 MG TB24 tablet Take 25 mg by mouth daily. 10/16/16  Yes [provider]  simvastatin (ZOCOR) 40 MG tablet Take 40 mg by mouth daily.   Yes [provider]    Allergies:        Allergies  Allergen Reactions  . No Known Allergies     Social History:   Social History        Social History  . Marital status: Single    Spouse name: N/A  . Number of children: N/A  . Years of education: N/A       Occupational History  . Retired        Social History Main Topics  . Smoking status: Never Smoker  . Smokeless tobacco: Never Used  . Alcohol use No  . Drug use: No  . Sexual activity: Not on file       Other Topics Concern  . Not on file      Social History Narrative   Pt lives alone in [place]. Does not have stairs.    Family History:   The patient's family history includes Cancer in his mother; Diabetes in his sister; Heart attack (age of onset: 26) in his father; Heart disease in his father; Nephrolithiasis in his father; Other in his mother. Pt indicated that his mother is deceased. He indicated that his father is deceased. He indicated that his sister is alive.    ROS:  Please see the history of present illness.  All other ROS reviewed and negative.      Physical Exam/Data:         Vitals:   11/09/16 0900  11/09/16 0912 11/09/16 0913 11/09/16 0915  BP: (!) 153/79   (!) 142/79  Pulse:  (!) 46 79 83  Resp: (!) 23 (!) 21  18  Temp:    98 F (36.7 C)  TempSrc:    Oral  SpO2:  100%  100%  Weight:   (!) 375 lb (170.1 kg)   Height:   5\' 9"  (1.753 m)     Intake/Output Summary (Last 24 hours) at  11/09/16 1016 Last data filed at 11/09/16 0802  Gross per 24 hour  Intake                0 ml  Output              850 ml  Net             -850 ml      Filed Weights   11/09/16 0913  Weight: (!) 375 lb (170.1 kg)   Body mass index is 55.38 kg/m.  General:  Well nourished, well developed, in no acute distress once vomiting resolved HEENT: normal Lymph: no adenopathy Neck: no JVD scene, not able to assess accurately due to body habitus Endocrine:  No thryomegaly Vascular: No carotid bruits; upper extremity pulses 2+ bilaterally, lower extremity pulses more difficult to palpate because of edema, but are present  Cardiac:  normal S1, S2; irregular rate and rhythm; no murmur, rub or gallop noted. Currently in sinus rhythm with irregularity caused by PVCs Lungs: Rales bases bilaterally, no wheezing, rhonchi  Abd: soft, nontender, no hepatomegaly  Ext: 2+ edema left, 1+ edema right Musculoskeletal:  No deformities, BUE and BLE strength weak but equal Skin: warm and dry  Neuro:  CNs 2-12 intact, no focal abnormalities noted Psych:  Normal affect   EKG:  The EKG was personally reviewed and demonstrates:  At 3:16 AM, heart rate 125, wide-complex tachycardia, the first half of the ECG is regular and then he hasI change in morphology and the rhythm becomes irregular, still with no P waves. Believe atrial tachycardia transitioned to atrial fib  At 9:34 AM, sinus rhythm with first-degree AV block, left bundle branch block and PVCs  Telemetry:  Telemetry was personally reviewed and demonstrates:  Wide-complex tachycardia, rapid atrial fibrillation, sinus rhythm with  PVCs  Relevant CV Studies:  ECHO: 07/10/2016 - Left ventricle: The cavity size was normal. Wall thickness was increased in a pattern of moderate LVH. Systolic function was normal. The estimated ejection fraction was in the range of 60% to 65%. Wall motion was normal; there were no regional wall motion abnormalities. Features are consistent with a pseudonormal left ventricular filling pattern, with concomitant abnormal relaxation and increased filling pressure (grade 2 diastolicdysfunction). - Aortic valve: There was mild stenosis. Valve area (Vmax): 2.81cm^2. - Left atrium: The atrium was mildly dilated. - Right atrium: The atrium was mildly dilated.  Laboratory Data:  Chemistry  Last Labs    Recent Labs Lab 11/09/16 0341  NA 140  K 4.0  CL 106  CO2 19*  GLUCOSE 167*  BUN 24*  CREATININE 2.11*  CALCIUM 8.8*  GFRNONAA 30*  GFRAA 35*  ANIONGAP 15       Hematology  Last Labs    Recent Labs Lab 11/09/16 0341  WBC 13.2*  RBC 5.09  HGB 14.9  HCT 45.6  MCV 89.6  MCH 29.3  MCHC 32.7  RDW 14.2  PLT 233     Cardiac Enzymes  Last Labs    Recent Labs Lab 11/09/16 0341 11/09/16 0754  TROPONINI 0.62* 3.77*       Last Labs    Recent Labs Lab 11/09/16 0337  TROPIPOC 0.48*       Radiology/Studies:  Dg Chest Port 1 View  Result Date: 11/09/2016 CLINICAL DATA:  Heart palpitations and shortness of breath. EXAM: PORTABLE CHEST 1 VIEW COMPARISON:  07/03/2016 FINDINGS: Shallow inspiration. Normal heart size and pulmonary vascularity. No focal airspace disease or consolidation in the lungs.  No blunting of costophrenic angles. No pneumothorax. Mediastinal contours appear intact. IMPRESSION: No active disease. Electronically Signed   By: Lucienne Capers M.D.   On: 11/09/2016 04:27    Assessment and Plan:   Principal Problem:   NSTEMI (non-ST elevated myocardial infarction) ([place]) - no chest pain, he had right shoulder pain  and right arm pain prior to admission. - troponin elevation could be due to tachycardia, but this is higher than I would expect from a heart rate of 120-130, suspect underlying CAD  Active Problems:   Chronic diastolic CHF (congestive heart failure), NYHA class 4 ([facility]) - needs Lasix to manage volume, but BUN/Cr Are higher than normal. - Strict intake/output, daily weights for now - Needs education on sodium and fluid restrictions, he is willing to see a nutritionist - Was not doing daily weights at home, but can start.    Tachyarrhythmia - continue amiodarone for now, I'm not convinced that he had ventricular tachycardia, but he did have rapid atrial fibrillation and possibly atrial tachycardia - He is having frequent ventricular ectopy now.    AKI (acute kidney injury) [facility]) - June 2018 BUN/creatinine were 18/1.23 - Today BUN/creatinine are 24/2.11 - he is not on ACE inhibitor or ARB, we will hold meloxicam  Plan: Admit to Cardiology, stepdown as an inpatient, at [facility], Rosaria Ferries, PA-C  11/09/2016 10:16 AM

## 2016-11-09 NOTE — Consult Note (Signed)
  Entered in error  Signed, Rosaria Ferries, PA-C  11/09/2016 10:16 AM

## 2016-11-09 NOTE — ED Notes (Signed)
PT ASSISTED TO SEMI-FOWLER'S POSITION STAFF X 4. PT TOLERATED. PT SHORTLY AFTER VOMITED. PT ASSISTED TO HIGH -FOWLERS. VOMITED SEVERAL TIMES. SUCTION SET UP TO ASSIST WITH SECRETION. CARDIO PA WITNESSED EVENT. 200 CC TOTAL. SCANT AMOUNT OF BLOOD VIEWED. PT STATES HE HAS EPISODES OF THIS IN THE PAST. (AFTER LYING DOWN FOR LONG PERIODS OF TIME). VERBALIZED IF HE FEELS EVENT AGAIN TO NOTIFY STAFF. ZOFRAN ORDERED AND GIVEN. PT STATES HE FEELS BETTER. NO CHEST PAIN OR SHORTNESS OF BREATH DURING OR AT PRESENT. EKG PERFORMED AND GIVEN TO CARDIO PA

## 2016-11-09 NOTE — ED Notes (Signed)
CALLED FOR SPECIALITY BED. PT HOLDING FOR STEP DOWN BED AT [facility]

## 2016-11-09 NOTE — ED Notes (Signed)
ATTEMPT 3RD IV X 2 NOT SUCCESSFUL

## 2016-11-09 NOTE — Progress Notes (Signed)
ANTICOAGULATION CONSULT NOTE - Follow Up Consult  Pharmacy Consult for IV Heparin Indication: chest pain/ACS  Allergies  Allergen Reactions  . No Known Allergies     Patient Measurements: Height: 5\' 9"  (175.3 cm) Weight: (!) 366 lb 2.9 oz (166.1 kg) IBW/kg (Calculated) : 70.7 Heparin Dosing Weight: 113 kg  Vital Signs: Temp: 97.8 F (36.6 C) (10/19 1900) Temp Source: Oral (10/19 1900) BP: 116/50 (10/19 1900) Pulse Rate: 69 (10/19 1930)  Labs:  Recent Labs  11/09/16 0341 11/09/16 0754 11/09/16 1706  HGB 14.9  --   --   HCT 45.6  --   --   PLT 233  --   --   HEPARINUNFRC  --   --  0.20*  CREATININE 2.11*  --   --   TROPONINI 0.62* 3.77* 8.36*    Estimated Creatinine Clearance: 50.2 mL/min (A) (by C-G formula based on SCr of 2.11 mg/dL (H)).   Medications:  Infusions:  . sodium chloride 50 mL/hr at 11/09/16 1924  . sodium chloride    . sodium chloride    . amiodarone 30 mg/hr (11/09/16 1924)  . heparin 1,350 Units/hr (11/09/16 1924)  . nitroGLYCERIN 5 mcg/min (11/09/16 1924)    Assessment: 71 year old male with NSTEMI who transferred from [place] to [facility] for pending Cardiac Cath on IV heparin.   Initial heparin level is 0.20 (drawn 1.5 hrs early), sub-therapeutic on 1350 units/hr.   Goal of Therapy:  Heparin level 0.3-0.7 units/ml Monitor platelets by anticoagulation protocol: Yes   Plan:  Increase heparin to 1600 units/hr. Recheck heparin level in 8 hours.  Daily heparin level and CBC.   Sloan Leiter, PharmD, BCPS Clinical Pharmacist Clinical phone 11/09/2016 until 11PM [phone] After hours, please call #[phone] 11/09/2016,7:45 PM

## 2016-11-09 NOTE — ED Provider Notes (Signed)
Bed available at [facility]. Admitted by Dr. Debara Pickett.   Fatima Blank, MD 11/09/16 [phone]

## 2016-11-09 NOTE — ED Notes (Signed)
Notified EDP,Molpus,MD., pt. I-stat troponin results 0.48 and RN,Emily made aware.

## 2016-11-09 NOTE — ED Notes (Addendum)
CARDIO Provider PA at bedside. AWARE AMIODARONE DRIP NOT GIVEN AT PRESENT. MADE AWARE OF CURRENT RATE AND CURRENT VITAL SIGNS. REQUESTING IF MEDICATION SHOULD BE ADMINISTERED. AWAITING RESPONSE.

## 2016-11-10 LAB — CBC
HCT: 41.2 % (ref 39.0–52.0)
HEMATOCRIT: 40.5 % (ref 39.0–52.0)
HEMOGLOBIN: 13.1 g/dL (ref 13.0–17.0)
HEMOGLOBIN: 12.8 g/dL — AB (ref 13.0–17.0)
MCH: 28.9 pg (ref 26.0–34.0)
MCH: 27.9 pg (ref 26.0–34.0)
MCHC: 32.3 g/dL (ref 30.0–36.0)
MCHC: 31.1 g/dL (ref 30.0–36.0)
MCV: 89.4 fL (ref 78.0–100.0)
MCV: 90 fL (ref 78.0–100.0)
Platelets: 144 10*3/uL — ABNORMAL LOW (ref 150–400)
Platelets: 145 10*3/uL — ABNORMAL LOW (ref 150–400)
RBC: 4.53 MIL/uL (ref 4.22–5.81)
RBC: 4.58 MIL/uL (ref 4.22–5.81)
RDW: 14.6 % (ref 11.5–15.5)
RDW: 14.8 % (ref 11.5–15.5)
WBC: 9.8 10*3/uL (ref 4.0–10.5)
WBC: 10.1 10*3/uL (ref 4.0–10.5)

## 2016-11-10 LAB — BASIC METABOLIC PANEL
ANION GAP: 11 (ref 5–15)
BUN: 27 mg/dL — AB (ref 6–20)
CO2: 26 mmol/L (ref 22–32)
Calcium: 8.4 mg/dL — ABNORMAL LOW (ref 8.9–10.3)
Chloride: 101 mmol/L (ref 101–111)
Creatinine, Ser: 1.69 mg/dL — ABNORMAL HIGH (ref 0.61–1.24)
GFR calc Af Amer: 46 mL/min — ABNORMAL LOW (ref >60)
GFR calc non Af Amer: 39 mL/min — ABNORMAL LOW (ref >60)
Glucose, Bld: 95 mg/dL (ref 65–99)
POTASSIUM: 3.6 mmol/L (ref 3.5–5.1)
Sodium: 138 mmol/L (ref 135–145)

## 2016-11-10 LAB — GLUCOSE, CAPILLARY: Glucose-Capillary: 103 mg/dL — ABNORMAL HIGH (ref 65–99)

## 2016-11-10 LAB — HEPARIN LEVEL (UNFRACTIONATED)
HEPARIN UNFRACTIONATED: 0.2 [IU]/mL — AB (ref 0.30–0.70)
HEPARIN UNFRACTIONATED: 0.37 [IU]/mL (ref 0.30–0.70)

## 2016-11-10 LAB — TROPONIN I: Troponin I: 4.97 ng/mL (ref <0.03)

## 2016-11-10 MED ORDER — ASPIRIN 81 MG PO CHEW
81.0000 mg | CHEWABLE_TABLET | ORAL | Status: AC
  Administered 2016-11-12: 81 mg via ORAL
  Filled 2016-11-10: qty 1

## 2016-11-10 MED ORDER — SODIUM CHLORIDE 0.9% FLUSH: 3.0000 mL | INTRAVENOUS | Status: DC | PRN

## 2016-11-10 MED ORDER — SODIUM CHLORIDE 0.9 % WEIGHT BASED INFUSION: 1.0000 mL/kg/h | INTRAVENOUS | Status: DC

## 2016-11-10 MED ORDER — SODIUM CHLORIDE 0.9 % IV SOLN: 250.0000 mL | INTRAVENOUS | Status: DC | PRN

## 2016-11-10 MED ORDER — METOPROLOL TARTRATE 12.5 MG HALF TABLET
12.5000 mg | ORAL_TABLET | Freq: Two times a day (BID) | ORAL | Status: DC
  Administered 2016-11-10 – 2016-11-12 (×5): 12.5 mg via ORAL
  Filled 2016-11-10 (×5): qty 1

## 2016-11-10 MED ORDER — SODIUM CHLORIDE 0.9% FLUSH
3.0000 mL | Freq: Two times a day (BID) | INTRAVENOUS | Status: DC
  Administered 2016-11-10 – 2016-11-11 (×3): 3 mL via INTRAVENOUS

## 2016-11-10 MED ORDER — AMIODARONE HCL 200 MG PO TABS
400.0000 mg | ORAL_TABLET | Freq: Two times a day (BID) | ORAL | Status: DC
  Administered 2016-11-10 – 2016-11-13 (×7): 400 mg via ORAL
  Filled 2016-11-10 (×7): qty 2

## 2016-11-10 NOTE — Progress Notes (Addendum)
ANTICOAGULATION CONSULT NOTE - Follow Up Consult  Pharmacy Consult for IV Heparin Indication: chest pain/ACS  Allergies  Allergen Reactions  . No Known Allergies    Patient Measurements: Height: 5\' 9"  (175.3 cm) Weight: (!) 366 lb 2.9 oz (166.1 kg) IBW/kg (Calculated) : 70.7 Heparin Dosing Weight: 113 kg  Vital Signs: Temp: 98.5 F (36.9 C) (10/20 0429) Temp Source: Oral (10/20 0429) BP: 122/59 (10/20 0429) Pulse Rate: 71 (10/20 0429)  Labs:  Recent Labs  11/09/16 0341 11/09/16 0754 11/09/16 1706 11/10/16 0451  HGB 14.9  --   --  13.1  HCT 45.6  --   --  40.5  PLT 233  --   --  144*  HEPARINUNFRC  --   --  0.20* 0.20*  CREATININE 2.11*  --   --   --   TROPONINI 0.62* 3.77* 8.36*  --     Estimated Creatinine Clearance: 50.2 mL/min (A) (by C-G formula based on SCr of 2.11 mg/dL (H)).   Medications:  Infusions:  . sodium chloride Stopped (11/09/16 2005)  . sodium chloride    . sodium chloride 75 mL/hr at 11/09/16 2005  . amiodarone 30 mg/hr (11/09/16 2019)  . heparin 1,600 Units/hr (11/10/16 0055)  . nitroGLYCERIN 5 mcg/min (11/09/16 1924)   Assessment: 71 year old male with NSTEMI who transferred from [place] to [facility] for pending Cardiac Cath on IV heparin.   Heparin level subtherapeutic at 0.2 and no infusion issues per RN. Noted CBC downtrending, no s/s bleeding per RN.   Dr. Radford Pax made aware of downtrending hgb/plts. Will repeat CBC in 4 hrs. Okay to continue heparin gtt at this time.   Will hold bolus in setting of reduced platelets.   Goal of Therapy:  Heparin level 0.3-0.7 units/ml Monitor platelets by anticoagulation protocol: Yes   Plan:  Increase heparin to 1800 units/hr Heparin level in 8 hrs  CBC in 4 hrs  Daily heparin level and CBC Monitor platelet trend and for s/s bleeding  Lavonda Jumbo, PharmD Clinical Pharmacist 11/10/16 5:41 AM

## 2016-11-10 NOTE — Progress Notes (Signed)
ANTICOAGULATION CONSULT NOTE - Follow Up Consult  Pharmacy Consult for IV Heparin Indication: chest pain/ACS  Allergies  Allergen Reactions  . No Known Allergies    Patient Measurements: Height: 5\' 9"  (175.3 cm) Weight: (!) 352 lb 8.3 oz (159.9 kg) IBW/kg (Calculated) : 70.7 Heparin Dosing Weight: 113 kg  Vital Signs: Temp: 97.9 F (36.6 C) (10/20 1446) Temp Source: Oral (10/20 1446) BP: 115/72 (10/20 1446) Pulse Rate: 69 (10/20 1446)  Labs:  Recent Labs  11/09/16 0341 11/09/16 0754 11/09/16 1706 11/10/16 0451 11/10/16 0845 11/10/16 1317  HGB 14.9  --   --  13.1 12.8*  --   HCT 45.6  --   --  40.5 41.2  --   PLT 233  --   --  144* 145*  --   HEPARINUNFRC  --   --  0.20* 0.20*  --  0.37  CREATININE 2.11*  --   --  1.69*  --   --   TROPONINI 0.62* 3.77* 8.36* 4.97*  --   --     Estimated Creatinine Clearance: 61.2 mL/min (A) (by C-G formula based on SCr of 1.69 mg/dL (H)).   Medications:  Infusions:  . sodium chloride 75 mL (11/10/16 0700)  . heparin 1,800 Units/hr (11/10/16 8841)  . nitroGLYCERIN 5 mcg/min (11/09/16 1924)   Assessment: 71 year old male with NSTEMI who transferred from [place] to [facility] for pending Cardiac Cath on IV heparin.   Heparin level therapeutic 0.37 on heparin drip 1800 uts/hr. No s/s bleeding per RN. H/h and pltc fell slightly overnight  But stable on recheck.      Goal of Therapy:  Heparin level 0.3-0.7 units/ml Monitor platelets by anticoagulation protocol: Yes   Plan:  Continue  heparin to 1800 units/hr Daily heparin level and CBC Monitor platelet trend and for s/s bleeding  Bonnita Nasuti Pharm.D. CPP, BCPS Clinical Pharmacist [phone] 11/10/2016 2:50 PM

## 2016-11-10 NOTE — Progress Notes (Signed)
Progress Note  Patient Name: Justin Tate Date of Encounter: 11/10/2016  Primary Cardiologist: Dr Acie Fredrickson  Subjective   Pt denies CP or dyspnea  Inpatient Medications    Scheduled Meds: . aspirin EC  81 mg Oral Daily  . atorvastatin  80 mg Oral q1800  . buPROPion  300 mg Oral Daily  . cholecalciferol  2,000 Units Oral Daily  . ferrous sulfate  325 mg Oral Q breakfast  . mirabegron ER  25 mg Oral Daily  . multivitamin with minerals  1 tablet Oral Daily  . sodium chloride flush  3 mL Intravenous Q12H   Continuous Infusions: . sodium chloride Stopped (11/09/16 2005)  . sodium chloride    . sodium chloride 75 mL/hr at 11/09/16 2005  . amiodarone 30 mg/hr (11/09/16 2019)  . heparin 1,800 Units/hr (11/10/16 2423)  . nitroGLYCERIN 5 mcg/min (11/09/16 1924)   PRN Meds: sodium chloride, acetaminophen, ALPRAZolam, nitroGLYCERIN, ondansetron (ZOFRAN) IV, sodium chloride flush, zolpidem   Vital Signs    Vitals:   11/09/16 1945 11/09/16 2309 11/10/16 0429 11/10/16 0559  BP: (!) 127/49 (!) 100/39 (!) 122/59   Pulse: 64 64 71   Resp: 20 19 14    Temp:  (!) 97.4 F (36.3 C) 98.5 F (36.9 C)   TempSrc:  Oral Oral   SpO2: 94% 99% 99%   Weight:    (!) 159.9 kg (352 lb 8.3 oz)  Height:        Intake/Output Summary (Last 24 hours) at 11/10/16 0807 Last data filed at 11/10/16 0452  Gross per 24 hour  Intake          2159.46 ml  Output              775 ml  Net          1384.46 ml   Filed Weights   11/09/16 0913 11/09/16 1900 11/10/16 0559  Weight: (!) 170.1 kg (375 lb) (!) 166.1 kg (366 lb 2.9 oz) (!) 159.9 kg (352 lb 8.3 oz)    Telemetry    Sinus with 4 beats NSVT- Personally Reviewed   Physical Exam   GEN: No acute distress.  Morbidly obese Neck: No JVD Cardiac: RRR, no murmurs, rubs, or gallops.  Respiratory: Clear to auscultation bilaterally. GI: Soft, nontender, non-distended  MS: No edema; No deformity. Neuro:  Nonfocal  Psych: Normal affect   Labs      Chemistry Recent Labs Lab 11/09/16 0341 11/09/16 1706 11/10/16 0451  NA 140  --  138  K 4.0  --  3.6  CL 106  --  101  CO2 19*  --  26  GLUCOSE 167*  --  95  BUN 24*  --  27*  CREATININE 2.11*  --  1.69*  CALCIUM 8.8*  --  8.4*  PROT  --  6.7  --   ALBUMIN  --  3.3*  --   AST  --  63*  --   ALT  --  30  --   ALKPHOS  --  64  --   BILITOT  --  0.6  --   GFRNONAA 30*  --  39*  GFRAA 35*  --  46*  ANIONGAP 15  --  11     Hematology Recent Labs Lab 11/09/16 0341 11/10/16 0451  WBC 13.2* 9.8  RBC 5.09 4.53  HGB 14.9 13.1  HCT 45.6 40.5  MCV 89.6 89.4  MCH 29.3 28.9  MCHC 32.7 32.3  RDW 14.2 14.6  PLT 233 144*    Cardiac Enzymes Recent Labs Lab 11/09/16 0341 11/09/16 0754 11/09/16 1706 11/10/16 0451  TROPONINI 0.62* 3.77* 8.36* 4.97*    Recent Labs Lab 11/09/16 0337  TROPIPOC 0.48*     BNP Recent Labs Lab 11/09/16 1706  BNP 656.2*      Radiology    Dg Chest Port 1 View  Result Date: 11/09/2016 CLINICAL DATA:  Heart palpitations and shortness of breath. EXAM: PORTABLE CHEST 1 VIEW COMPARISON:  07/03/2016 FINDINGS: Shallow inspiration. Normal heart size and pulmonary vascularity. No focal airspace disease or consolidation in the lungs. No blunting of costophrenic angles. No pneumothorax. Mediastinal contours appear intact. IMPRESSION: No active disease. Electronically Signed   By: Lucienne Capers M.D.   On: 11/09/2016 04:27    Patient Profile     71 y.o. male admitted with new onset atrial fibrillation and also ruled in for a non-ST elevation myocardial infarction. Also with renal insufficiency.  Assessment & Plan    1 paroxysmal atrial fibrillation-patient presented with new onset atrial fibrillation. He has converted to sinus rhythm. Change amiodarone to 400 mg twice a day for 1 week and then 200 mg daily thereafter. I would plan to continue for 8 weeks and then discontinue to see if he hold sinus on his own. Check TSH. Await echocardiogram.  CHADSvasc 4. Would begin apixaban 5 mg BID at DC.  2 non-ST elevation myocardial infarction-patient has ruled in. Continue aspirin, heparin, nitroglycerin and statin. Add metoprolol 12.5 mg twice a day. Patient will require cardiac catheterization. The risks and benefits including myocardial infarction, CVA and death discussed and he agrees to proceed. I also explained the risk of worsening renal insufficiency. We will tentatively plan for Monday pending renal function. No ventriculogram. Check echocardiogram for LV function. Hydrate prior to procedure. Follow renal function closely afterwards.  3 acute on chronic stage III kidney disease-renal function improved this morning. Plan as outlined above prior to catheterization.  4 hyperlipidemia-continue statin.  5 hypertension-blood pressure is controlled.  6 morbid obesity-needs weight loss.  For questions or updates, please contact [place] Please consult [email or web address] for contact info under Cardiology/STEMI.      Signed, Kirk Ruths, MD  11/10/2016, 8:07 AM

## 2016-11-10 NOTE — Progress Notes (Signed)
Per Dr. Stanford Breed, dc standing IV fluids today, pre-cath orders written per our discussion to include standard hydration protocol starting tomorrow night at 8pm. I will put on add-on board for Monday. Richard Holz PA-C

## 2016-11-11 ENCOUNTER — Inpatient Hospital Stay (HOSPITAL_COMMUNITY): Payer: Medicare HMO

## 2016-11-11 DIAGNOSIS — I361 Nonrheumatic tricuspid (valve) insufficiency: Secondary | ICD-10-CM

## 2016-11-11 LAB — ECHOCARDIOGRAM COMPLETE
Ao-asc: 35 cm
Area-P 1/2: 5.79 cm2
CHL CUP RV SYS PRESS: 62 mmHg
E decel time: 130 msec
FS: 26 % — AB (ref 28–44)
HEIGHTINCHES: 69 in
IV/PV OW: 1
LA diam end sys: 48 mm
LA vol A4C: 133 ml
LA vol index: 44.8 mL/m2
LA vol: 128 mL
LADIAMINDEX: 1.68 cm/m2
LASIZE: 48 mm
LVOT area: 3.46 cm2
LVOTD: 21 mm
MV Dec: 130
MV VTI: 125 cm
MVPG: 6 mmHg
MVPKAVEL: 73.2 m/s
MVPKEVEL: 119 m/s
MVSPHT: 38 ms
PW: 14 mm — AB (ref 0.6–1.1)
RV TAPSE: 20.1 mm
Reg peak vel: 342 cm/s
TR max vel: 342 cm/s
WEIGHTICAEL: 5555.59 [oz_av]

## 2016-11-11 LAB — CBC
HCT: 42 % (ref 39.0–52.0)
HEMOGLOBIN: 13.4 g/dL (ref 13.0–17.0)
MCH: 28.8 pg (ref 26.0–34.0)
MCHC: 31.9 g/dL (ref 30.0–36.0)
MCV: 90.1 fL (ref 78.0–100.0)
Platelets: 116 10*3/uL — ABNORMAL LOW (ref 150–400)
RBC: 4.66 MIL/uL (ref 4.22–5.81)
RDW: 14.5 % (ref 11.5–15.5)
WBC: 11.5 10*3/uL — ABNORMAL HIGH (ref 4.0–10.5)

## 2016-11-11 LAB — BASIC METABOLIC PANEL
ANION GAP: 8 (ref 5–15)
BUN: 20 mg/dL (ref 6–20)
CO2: 29 mmol/L (ref 22–32)
Calcium: 8.8 mg/dL — ABNORMAL LOW (ref 8.9–10.3)
Chloride: 102 mmol/L (ref 101–111)
Creatinine, Ser: 1.33 mg/dL — ABNORMAL HIGH (ref 0.61–1.24)
GFR calc Af Amer: >60 mL/min (ref >60)
GFR, EST NON AFRICAN AMERICAN: 53 mL/min — AB (ref >60)
GLUCOSE: 94 mg/dL (ref 65–99)
POTASSIUM: 4 mmol/L (ref 3.5–5.1)
Sodium: 139 mmol/L (ref 135–145)

## 2016-11-11 LAB — GLUCOSE, CAPILLARY
GLUCOSE-CAPILLARY: 90 mg/dL (ref 65–99)
GLUCOSE-CAPILLARY: 131 mg/dL — AB (ref 65–99)
Glucose-Capillary: 100 mg/dL — ABNORMAL HIGH (ref 65–99)
Glucose-Capillary: 129 mg/dL — ABNORMAL HIGH (ref 65–99)

## 2016-11-11 LAB — PROTIME-INR
INR: 1.2
Prothrombin Time: 15.2 seconds (ref 11.4–15.2)

## 2016-11-11 LAB — HEPARIN LEVEL (UNFRACTIONATED): Heparin Unfractionated: 0.22 IU/mL — ABNORMAL LOW (ref 0.30–0.70)

## 2016-11-11 LAB — TSH: TSH: 1.98 u[IU]/mL (ref 0.350–4.500)

## 2016-11-11 MED ORDER — PERFLUTREN LIPID MICROSPHERE
1.0000 mL | INTRAVENOUS | Status: AC | PRN
  Administered 2016-11-11: 2 mL via INTRAVENOUS
  Filled 2016-11-11: qty 10

## 2016-11-11 NOTE — Plan of Care (Signed)
Problem: Safety: Goal: Ability to remain free from injury will improve Outcome: Progressing Pt instructed not to get OOB without calling for assistance and was told to use his call bell or white phone with the RN/NT numbers on the board, all items within reach, will continue to monitor.

## 2016-11-11 NOTE — Progress Notes (Signed)
ANTICOAGULATION CONSULT NOTE - Follow Up Consult  Pharmacy Consult for IV Heparin Indication: chest pain/ACS  Allergies  Allergen Reactions  . No Known Allergies    Patient Measurements: Height: 5\' 9"  (175.3 cm) Weight: (!) 347 lb 3.6 oz (157.5 kg) IBW/kg (Calculated) : 70.7 Heparin Dosing Weight: 113 kg  Vital Signs: Temp: 98 F (36.7 C) (10/21 0622) Temp Source: Oral (10/21 0622) BP: 128/71 (10/21 0839) Pulse Rate: 75 (10/21 0622)  Labs:  Recent Labs  11/09/16 0341 11/09/16 0754  11/09/16 1706 11/10/16 0451 11/10/16 0845 11/10/16 1317 11/11/16 0627  HGB 14.9  --   --   --  13.1 12.8*  --  13.4  HCT 45.6  --   --   --  40.5 41.2  --  42.0  PLT 233  --   --   --  144* 145*  --  116*  LABPROT  --   --   --   --   --   --   --  15.2  INR  --   --   --   --   --   --   --  1.20  HEPARINUNFRC  --   --   < > 0.20* 0.20*  --  0.37 0.22*  CREATININE 2.11*  --   --   --  1.69*  --   --  1.33*  TROPONINI 0.62* 3.77*  --  8.36* 4.97*  --   --   --   < > = values in this interval not displayed.  Estimated Creatinine Clearance: 77 mL/min (A) (by C-G formula based on SCr of 1.33 mg/dL (H)).   Medications:  Infusions:  . sodium chloride 75 mL (11/10/16 0700)  . sodium chloride    . sodium chloride    . heparin 1,800 Units/hr (11/11/16 9892)   Assessment: 71 year old male with NSTEMI who transferred from [place] to [facility] for pending Cardiac Cath on IV heparin.   Heparin level subtherapeutic 0.22 on heparin drip 1800 uts/hr. No s/s bleeding per RN. H/h and pltc fell slightly since admit but stable overnight     Goal of Therapy:  Heparin level 0.3-0.7 units/ml Monitor platelets by anticoagulation protocol: Yes   Plan:  Increase  heparin to 200 units/hr Daily heparin level and CBC Monitor platelet trend and for s/s bleeding  Bonnita Nasuti Pharm.D. CPP, BCPS Clinical Pharmacist [phone] 11/11/2016 12:42 PM

## 2016-11-11 NOTE — Progress Notes (Signed)
Progress Note  Patient Name: Justin Tate Date of Encounter: 11/11/2016  Primary Cardiologist: Dr Acie Fredrickson  Subjective   No CP or dyspnea  Inpatient Medications    Scheduled Meds: . amiodarone  400 mg Oral BID  . [START ON 11/12/2016] aspirin  81 mg Oral Pre-Cath  . aspirin EC  81 mg Oral Daily  . atorvastatin  80 mg Oral q1800  . buPROPion  300 mg Oral Daily  . cholecalciferol  2,000 Units Oral Daily  . ferrous sulfate  325 mg Oral Q breakfast  . metoprolol tartrate  12.5 mg Oral BID  . mirabegron ER  25 mg Oral Daily  . multivitamin with minerals  1 tablet Oral Daily  . sodium chloride flush  3 mL Intravenous Q12H  . sodium chloride flush  3 mL Intravenous Q12H   Continuous Infusions: . sodium chloride 75 mL (11/10/16 0700)  . sodium chloride    . sodium chloride    . heparin 1,800 Units/hr (11/11/16 0512)  . nitroGLYCERIN 1.5 mcg/min (11/10/16 1900)   PRN Meds: sodium chloride, sodium chloride, acetaminophen, ALPRAZolam, nitroGLYCERIN, ondansetron (ZOFRAN) IV, sodium chloride flush, sodium chloride flush, zolpidem   Vital Signs    Vitals:   11/11/16 0629 11/11/16 0635 11/11/16 0705 11/11/16 0839  BP: (!) 125/57 120/79 127/70 128/71  Pulse:      Resp:      Temp:      TempSrc:      SpO2: 96% 95% 96%   Weight:      Height:        Intake/Output Summary (Last 24 hours) at 11/11/16 0942 Last data filed at 11/11/16 0700  Gross per 24 hour  Intake            622.5 ml  Output              695 ml  Net            -72.5 ml   Filed Weights   11/09/16 1900 11/10/16 0559 11/11/16 0622  Weight: (!) 166.1 kg (366 lb 2.9 oz) (!) 159.9 kg (352 lb 8.3 oz) (!) 157.5 kg (347 lb 3.6 oz)    Telemetry    Sinus with PVCs and 3 beats NSVT- Personally Reviewed   Physical Exam   GEN: WD Morbidly obese Neck: No JVD, supple Cardiac: RRR Respiratory: Clear to auscultation bilaterally. No wheeze GI: Soft, nontender, non-distended; exam difficult due to obesity MS: No  edema Neuro:  Nonfocal; grossly intact   Labs    Chemistry  Recent Labs Lab 11/09/16 0341 11/09/16 1706 11/10/16 0451 11/11/16 0627  NA 140  --  138 139  K 4.0  --  3.6 4.0  CL 106  --  101 102  CO2 19*  --  26 29  GLUCOSE 167*  --  95 94  BUN 24*  --  27* 20  CREATININE 2.11*  --  1.69* 1.33*  CALCIUM 8.8*  --  8.4* 8.8*  PROT  --  6.7  --   --   ALBUMIN  --  3.3*  --   --   AST  --  63*  --   --   ALT  --  30  --   --   ALKPHOS  --  64  --   --   BILITOT  --  0.6  --   --   GFRNONAA 30*  --  39* 53*  GFRAA 35*  --  46* >60  ANIONGAP 15  --  11 8     Hematology  Recent Labs Lab 11/10/16 0451 11/10/16 0845 11/11/16 0627  WBC 9.8 10.1 11.5*  RBC 4.53 4.58 4.66  HGB 13.1 12.8* 13.4  HCT 40.5 41.2 42.0  MCV 89.4 90.0 90.1  MCH 28.9 27.9 28.8  MCHC 32.3 31.1 31.9  RDW 14.6 14.8 14.5  PLT 144* 145* 116*    Cardiac Enzymes  Recent Labs Lab 11/09/16 0341 11/09/16 0754 11/09/16 1706 11/10/16 0451  TROPONINI 0.62* 3.77* 8.36* 4.97*     Recent Labs Lab 11/09/16 0337  TROPIPOC 0.48*     BNP  Recent Labs Lab 11/09/16 1706  BNP 656.2*       Patient Profile     71 y.o. male admitted with new onset atrial fibrillation and also ruled in for a non-ST elevation myocardial infarction. Also with renal insufficiency.  Assessment & Plan    1 paroxysmal atrial fibrillation-patient presented with new onset atrial fibrillation. He remains in sinus rhythm. Continue amiodarone 400 mg twice a day for 1 week and then 200 mg daily thereafter. I would plan to continue for 8 weeks and then discontinue to see if he hold sinus on his own. TSH normal. Await echocardiogram. CHADSvasc 4. Would begin apixaban 5 mg BID at DC.  2 non-ST elevation myocardial infarction-patient has ruled in. Continue aspirin, heparin, statin. Continue metoprolol 12.5 mg twice a day. DC NTG. For cardiac catheterization in AM. The risks and benefits including myocardial infarction, CVA and  death discussed and he agrees to proceed. I also explained the risk of worsening renal insufficiency. No ventriculogram. Check echocardiogram for LV function. Hydrate prior to procedure. Follow renal function closely afterwards.  3 acute on chronic stage III kidney disease-renal function improved this morning. Hydrate prior to cath.  4 hyperlipidemia-continue lipitor.  5 hypertension-blood pressure is controlled. Continue present meds.  6 morbid obesity-needs weight loss.  For questions or updates, please contact [place] Please consult [email or web address] for contact info under Cardiology/STEMI.      Signed, Kirk Ruths, MD  11/11/2016, 9:42 AM

## 2016-11-11 NOTE — Progress Notes (Signed)
  Echocardiogram 2D Echocardiogram has been performed.  Justin Tate 11/11/2016, 3:08 PM

## 2016-11-11 NOTE — Plan of Care (Signed)
Problem: Pain Managment: Goal: General experience of comfort will improve Outcome: Progressing Patient denies pain at this time, will continue to monitor.

## 2016-11-12 ENCOUNTER — Inpatient Hospital Stay (HOSPITAL_COMMUNITY)
Admission: EM | Disposition: A | Discharge status: Home Health | Payer: Self-pay | Source: Home / Self Care | Attending: Internal Medicine

## 2016-11-12 DIAGNOSIS — I5041 Acute combined systolic (congestive) and diastolic (congestive) heart failure: Secondary | ICD-10-CM

## 2016-11-12 DIAGNOSIS — I480 Paroxysmal atrial fibrillation: Secondary | ICD-10-CM

## 2016-11-12 DIAGNOSIS — I10 Essential (primary) hypertension: Secondary | ICD-10-CM

## 2016-11-12 HISTORY — PX: LEFT HEART CATH AND CORONARY ANGIOGRAPHY: CATH118249

## 2016-11-12 LAB — BASIC METABOLIC PANEL
ANION GAP: 7 (ref 5–15)
BUN: 17 mg/dL (ref 6–20)
CO2: 25 mmol/L (ref 22–32)
Calcium: 8.3 mg/dL — ABNORMAL LOW (ref 8.9–10.3)
Chloride: 103 mmol/L (ref 101–111)
Creatinine, Ser: 1.15 mg/dL (ref 0.61–1.24)
GFR calc Af Amer: >60 mL/min (ref >60)
GFR calc non Af Amer: >60 mL/min (ref >60)
GLUCOSE: 96 mg/dL (ref 65–99)
POTASSIUM: 4.1 mmol/L (ref 3.5–5.1)
Sodium: 135 mmol/L (ref 135–145)

## 2016-11-12 LAB — CBC
HEMATOCRIT: 40.8 % (ref 39.0–52.0)
HEMOGLOBIN: 12.8 g/dL — AB (ref 13.0–17.0)
MCH: 28.3 pg (ref 26.0–34.0)
MCHC: 31.4 g/dL (ref 30.0–36.0)
MCV: 90.3 fL (ref 78.0–100.0)
Platelets: 155 10*3/uL (ref 150–400)
RBC: 4.52 MIL/uL (ref 4.22–5.81)
RDW: 14.4 % (ref 11.5–15.5)
WBC: 12 10*3/uL — ABNORMAL HIGH (ref 4.0–10.5)

## 2016-11-12 LAB — POCT ACTIVATED CLOTTING TIME: Activated Clotting Time: 147 seconds

## 2016-11-12 LAB — HEPARIN LEVEL (UNFRACTIONATED)
Heparin Unfractionated: 0.14 IU/mL — ABNORMAL LOW (ref 0.30–0.70)
Heparin Unfractionated: <0.1 IU/mL — ABNORMAL LOW (ref 0.30–0.70)

## 2016-11-12 LAB — MAGNESIUM: Magnesium: 1.8 mg/dL (ref 1.7–2.4)

## 2016-11-12 LAB — GLUCOSE, CAPILLARY: Glucose-Capillary: 105 mg/dL — ABNORMAL HIGH (ref 65–99)

## 2016-11-12 SURGERY — LEFT HEART CATH AND CORONARY ANGIOGRAPHY
Anesthesia: LOCAL

## 2016-11-12 MED ORDER — LIDOCAINE HCL 2 % IJ SOLN
INTRAMUSCULAR | Status: DC | PRN
  Administered 2016-11-12: 12 mL
  Administered 2016-11-12: 5 mL

## 2016-11-12 MED ORDER — ASPIRIN 81 MG PO CHEW
81.0000 mg | CHEWABLE_TABLET | Freq: Every day | ORAL | Status: DC
  Administered 2016-11-13: 11:00:00 81 mg via ORAL
  Filled 2016-11-12: qty 1

## 2016-11-12 MED ORDER — ACETAMINOPHEN 325 MG PO TABS: 650.0000 mg | ORAL_TABLET | ORAL | Status: DC | PRN

## 2016-11-12 MED ORDER — LOSARTAN POTASSIUM 25 MG PO TABS
12.5000 mg | ORAL_TABLET | Freq: Every day | ORAL | Status: DC
  Administered 2016-11-13: 12.5 mg via ORAL
  Filled 2016-11-12: qty 0.5

## 2016-11-12 MED ORDER — METOPROLOL TARTRATE 25 MG PO TABS
25.0000 mg | ORAL_TABLET | Freq: Two times a day (BID) | ORAL | Status: AC
  Administered 2016-11-12: 23:00:00 25 mg via ORAL
  Filled 2016-11-12: qty 1

## 2016-11-12 MED ORDER — FENTANYL CITRATE (PF) 100 MCG/2ML IJ SOLN
INTRAMUSCULAR | Status: AC
  Filled 2016-11-12: qty 2

## 2016-11-12 MED ORDER — HEPARIN SODIUM (PORCINE) 1000 UNIT/ML IJ SOLN
INTRAMUSCULAR | Status: AC
  Filled 2016-11-12: qty 1

## 2016-11-12 MED ORDER — MIDAZOLAM HCL 2 MG/2ML IJ SOLN
INTRAMUSCULAR | Status: AC
  Filled 2016-11-12: qty 2

## 2016-11-12 MED ORDER — IOPAMIDOL (ISOVUE-370) INJECTION 76%
INTRAVENOUS | Status: AC
  Filled 2016-11-12: qty 100

## 2016-11-12 MED ORDER — SODIUM CHLORIDE 0.9% FLUSH: 3.0000 mL | INTRAVENOUS | Status: DC | PRN

## 2016-11-12 MED ORDER — VERAPAMIL HCL 2.5 MG/ML IV SOLN
INTRAVENOUS | Status: DC | PRN
  Administered 2016-11-12: 8 mL via INTRA_ARTERIAL

## 2016-11-12 MED ORDER — HEPARIN (PORCINE) IN NACL 2-0.9 UNIT/ML-% IJ SOLN
INTRAMUSCULAR | Status: AC
  Filled 2016-11-12: qty 1000

## 2016-11-12 MED ORDER — SODIUM CHLORIDE 0.9% FLUSH
3.0000 mL | Freq: Two times a day (BID) | INTRAVENOUS | Status: DC
  Administered 2016-11-12 – 2016-11-13 (×2): 3 mL via INTRAVENOUS

## 2016-11-12 MED ORDER — METOPROLOL SUCCINATE ER 50 MG PO TB24
50.0000 mg | ORAL_TABLET | Freq: Every day | ORAL | Status: DC
  Administered 2016-11-13: 50 mg via ORAL
  Filled 2016-11-12: qty 1

## 2016-11-12 MED ORDER — MIDAZOLAM HCL 2 MG/2ML IJ SOLN
INTRAMUSCULAR | Status: DC | PRN
  Administered 2016-11-12: 1 mg via INTRAVENOUS

## 2016-11-12 MED ORDER — MORPHINE SULFATE (PF) 4 MG/ML IV SOLN: 2.0000 mg | INTRAVENOUS | Status: DC | PRN

## 2016-11-12 MED ORDER — SODIUM CHLORIDE 0.9 % IV SOLN: INTRAVENOUS | Status: DC

## 2016-11-12 MED ORDER — FENTANYL CITRATE (PF) 100 MCG/2ML IJ SOLN
INTRAMUSCULAR | Status: DC | PRN
  Administered 2016-11-12: 25 ug via INTRAVENOUS

## 2016-11-12 MED ORDER — SODIUM CHLORIDE 0.9 % IV SOLN: 250.0000 mL | INTRAVENOUS | Status: DC | PRN

## 2016-11-12 MED ORDER — VERAPAMIL HCL 2.5 MG/ML IV SOLN
INTRAVENOUS | Status: AC
  Filled 2016-11-12: qty 2

## 2016-11-12 MED ORDER — HEPARIN (PORCINE) IN NACL 2-0.9 UNIT/ML-% IJ SOLN
INTRAMUSCULAR | Status: AC | PRN
  Administered 2016-11-12: 1000 mL

## 2016-11-12 MED ORDER — LIDOCAINE HCL 2 % IJ SOLN
INTRAMUSCULAR | Status: AC
  Filled 2016-11-12: qty 10

## 2016-11-12 MED ORDER — IOPAMIDOL (ISOVUE-370) INJECTION 76%
INTRAVENOUS | Status: DC | PRN
  Administered 2016-11-12: 50 mL via INTRA_ARTERIAL

## 2016-11-12 MED ORDER — ONDANSETRON HCL 4 MG/2ML IJ SOLN: 4.0000 mg | Freq: Four times a day (QID) | INTRAMUSCULAR | Status: DC | PRN

## 2016-11-12 SURGICAL SUPPLY — 11 items
CATH INFINITI 5FR MULTPACK ANG (CATHETERS) ×2 IMPLANT
GLIDESHEATH SLEND A-KIT 6F 22G (SHEATH) IMPLANT
GUIDEWIRE INQWIRE 1.5J.035X260 (WIRE) ×1 IMPLANT
INQWIRE 1.5J .035X260CM (WIRE) ×2
KIT HEART LEFT (KITS) ×2 IMPLANT
PACK CARDIAC CATHETERIZATION (CUSTOM PROCEDURE TRAY) ×2 IMPLANT
SHEATH PINNACLE 5F 10CM (SHEATH) ×2 IMPLANT
TRANSDUCER W/STOPCOCK (MISCELLANEOUS) ×2 IMPLANT
TUBING CIL FLEX 10 FLL-RA (TUBING) ×2 IMPLANT
WIRE EMERALD 3MM-J .035X150CM (WIRE) ×2 IMPLANT
WIRE HI TORQ VERSACORE-J 145CM (WIRE) ×2 IMPLANT

## 2016-11-12 NOTE — Progress Notes (Signed)
ANTICOAGULATION CONSULT NOTE - Follow Up Consult  Pharmacy Consult for heparin Indication: NSTEMI and Afib  Labs:  Recent Labs  11/09/16 0754  11/09/16 1706  11/10/16 0451 11/10/16 0845 11/10/16 1317 11/11/16 0627 11/12/16 0503  HGB  --   --   --   < > 13.1 12.8*  --  13.4 12.8*  HCT  --   --   --   < > 40.5 41.2  --  42.0 40.8  PLT  --   --   --   < > 144* 145*  --  116* 155  LABPROT  --   --   --   --   --   --   --  15.2  --   INR  --   --   --   --   --   --   --  1.20  --   HEPARINUNFRC  --   < > 0.20*  --  0.20*  --  0.37 0.22* 0.14*  CREATININE  --   --   --   --  1.69*  --   --  1.33* 1.15  TROPONINI 3.77*  --  8.36*  --  4.97*  --   --   --   --   < > = values in this interval not displayed.   Assessment: 71yo male remains subtherapeutic on heparin with lower heparin level despite rate increase yesterday; Plt trending back up, no signs of bleeding per RN.  Goal of Therapy:  Heparin level 0.3-0.7 units/ml   Plan:  Will increase heparin gtt by ~20% to 2400 units/hr and check level in 6hr.  Wynona Neat, PharmD, BCPS  11/12/2016,6:40 AM

## 2016-11-12 NOTE — Interval H&P Note (Signed)
Cath Lab Visit (complete for each Cath Lab visit)  Clinical Evaluation Leading to the Procedure:   ACS: Yes.    Non-ACS:    Anginal Classification: CCS III  Anti-ischemic medical therapy: Minimal Therapy (1 class of medications)  Non-Invasive Test Results: No non-invasive testing performed  Prior CABG: No previous CABG      History and Physical Interval Note:  11/12/2016 11:52 AM  Justin Tate  has presented today for surgery, with the diagnosis of NSTEMI  The various methods of treatment have been discussed with the patient and family. After consideration of risks, benefits and other options for treatment, the patient has consented to  Procedure(s): LEFT HEART CATH AND CORONARY ANGIOGRAPHY (N/A) as a surgical intervention .  The patient's history has been reviewed, patient examined, no change in status, stable for surgery.  I have reviewed the patient's chart and labs.  Questions were answered to the patient's satisfaction.     Quay Burow

## 2016-11-12 NOTE — Progress Notes (Signed)
Site area: Right groin a 5 french arterial sheath was removed Site Prior to Removal:  Level 0  Pressure Applied For 25 MINUTES    Bedrest Beginning at 1340p  Manual:   Yes.    Patient Status During Pull:  stable  Post Pull Groin Site:  Level 0  Post Pull Instructions Given:  Yes.    Post Pull Pulses Present:  Yes.    Dressing Applied:  Yes.    Comments:  VS remain stable during sheath pull

## 2016-11-12 NOTE — Care Management Note (Addendum)
Case Management Note  Patient Details  Name: Justin Tate MRN: [id number] Date of Birth: Jul 23, 1945  Subjective/Objective:   From home alone, presents with NSTEMI, s/p cath -normal CAD,will treat medically.   Per pt eval rec SNF, patient refusing SNF, he will need HHRN, HHPT, he chose [facility] , referral made to [facility], soc will begin 24-48 hrs post dc. Patient has a rolling walker in his room and a cane.  He will also need a cab voucher, Lorriane Shire CSW is getting this for patient, he will also need to go by [facility] on [address] to pick up meds while in cab.  Nere to call Lorriane Shire when patient is ready for dc today.              Action/Plan: NCM will follow for dc needs.   Expected Discharge Date:   (unknown)               Expected Discharge Plan:  Home/Self Care  In-House Referral:     Discharge planning Services  CM Consult  Post Acute Care Choice:    Choice offered to:     DME Arranged:    DME Agency:     HH Arranged:    [place] Agency:     Status of Service:  Completed, signed off  If discussed at [employer] of Stay Meetings, dates discussed:    Additional Comments:  Zenon Mayo, RN 11/12/2016, 3:53 PM

## 2016-11-12 NOTE — Progress Notes (Addendum)
Progress Note  Patient Name: Justin Tate Date of Encounter: 11/12/2016  Primary Cardiologist: Dr. Acie Fredrickson  Subjective   Feeling well.  Denies chest pain or palpitations.  Breathing stable.  Inpatient Medications    Scheduled Meds: . amiodarone  400 mg Oral BID  . [START ON 11/13/2016] aspirin  81 mg Oral Daily  . atorvastatin  80 mg Oral q1800  . buPROPion  300 mg Oral Daily  . cholecalciferol  2,000 Units Oral Daily  . ferrous sulfate  325 mg Oral Q breakfast  . metoprolol tartrate  12.5 mg Oral BID  . mirabegron ER  25 mg Oral Daily  . multivitamin with minerals  1 tablet Oral Daily  . sodium chloride flush  3 mL Intravenous Q12H  . sodium chloride flush  3 mL Intravenous Q12H   Continuous Infusions: . sodium chloride 75 mL (11/10/16 0700)  . sodium chloride     PRN Meds: sodium chloride, sodium chloride, acetaminophen, ALPRAZolam, morphine injection, nitroGLYCERIN, ondansetron (ZOFRAN) IV, sodium chloride flush, sodium chloride flush, zolpidem   Vital Signs    Vitals:   11/12/16 1300 11/12/16 1315 11/12/16 1330 11/12/16 1345  BP: 136/77 134/68 (!) 155/74 (!) 147/78  Pulse: 77 80 79 76  Resp: (!) 25 19 (!) 25 (!) 23  Temp:    98.4 F (36.9 C)  TempSrc:    Oral  SpO2: 91% 97% 91% 93%  Weight:      Height:        Intake/Output Summary (Last 24 hours) at 11/12/16 1800 Last data filed at 11/12/16 1414  Gross per 24 hour  Intake           2139.8 ml  Output             1750 ml  Net            389.8 ml   Filed Weights   11/10/16 0559 11/11/16 0622 11/12/16 0330  Weight: (!) 159.9 kg (352 lb 8.3 oz) (!) 157.5 kg (347 lb 3.6 oz) (!) 158.9 kg (350 lb 5 oz)    Telemetry    Sinus rhythm.  Frequent PVCs.  Ventricular bigeminy.- Personally Reviewed  ECG    N/a- Personally Reviewed  Physical Exam   VS:  BP (!) 147/78 (BP Location: Left Arm)   Pulse 76   Temp 98.4 F (36.9 C) (Oral)   Resp (!) 23   Ht 5\' 9"  (1.753 m)   Wt (!) 158.9 kg (350 lb 5 oz)    SpO2 93%   BMI 51.73 kg/m  , BMI Body mass index is 51.73 kg/m. GENERAL:  Well appearing.  No acute distress.  HEENT: Pupils equal round and reactive, fundi not visualized, oral mucosa unremarkable NECK:  No jugular venous distention, waveform within normal limits, carotid upstroke brisk and symmetric, no bruits, no thyromegaly LYMPHATICS:  No cervical adenopathy LUNGS:  Clear to auscultation bilaterally HEART:  RRR.  PMI not displaced or sustained,S1 and S2 within normal limits, no S3, no S4, no clicks, no rubs, no murmurs ABD:  Morbidly obese. Positive bowel sounds normal in frequency in pitch, no bruits, no rebound, no guarding, no midline pulsatile mass, no hepatomegaly, no splenomegaly EXT:  2 plus pulses throughout, no edema, no cyanosis no clubbing SKIN:  No rashes no nodules NEURO:  Cranial nerves II through XII grossly intact, motor grossly intact throughout PSYCH:  Cognitively intact, oriented to person place and time   [employer]  11/09/16 1706 11/10/16 0451 11/11/16 0627 11/12/16 0503  NA  --  138 139 135  K  --  3.6 4.0 4.1  CL  --  101 102 103  CO2  --  26 29 25   GLUCOSE  --  95 94 96  BUN  --  27* 20 17  CREATININE  --  1.69* 1.33* 1.15  CALCIUM  --  8.4* 8.8* 8.3*  PROT 6.7  --   --   --   ALBUMIN 3.3*  --   --   --   AST 63*  --   --   --   ALT 30  --   --   --   ALKPHOS 64  --   --   --   BILITOT 0.6  --   --   --   GFRNONAA  --  39* 53* >60  GFRAA  --  46* >60 >60  ANIONGAP  --  11 8 7      Hematology Recent Labs Lab 11/10/16 0845 11/11/16 0627 11/12/16 0503  WBC 10.1 11.5* 12.0*  RBC 4.58 4.66 4.52  HGB 12.8* 13.4 12.8*  HCT 41.2 42.0 40.8  MCV 90.0 90.1 90.3  MCH 27.9 28.8 28.3  MCHC 31.1 31.9 31.4  RDW 14.8 14.5 14.4  PLT 145* 116* 155    Cardiac Enzymes Recent Labs Lab 11/09/16 0341 11/09/16 0754 11/09/16 1706 11/10/16 0451  TROPONINI 0.62* 3.77* 8.36* 4.97*    Recent Labs Lab 11/09/16 0337  TROPIPOC  0.48*     BNP Recent Labs Lab 11/09/16 1706  BNP 656.2*     DDimer No results for input(s): DDIMER in the last 168 hours.   Radiology    No results found.  Cardiac Studies   Echo 11/12/16: Study Conclusions  - Left ventricle: The cavity size was normal. There was moderate   concentric hypertrophy. Systolic function was mildly reduced. The   estimated ejection fraction was in the range of 45% to 50%.   Diffuse hypokinesis. Features are consistent with a pseudonormal   left ventricular filling pattern, with concomitant abnormal   relaxation and increased filling pressure (grade 2 diastolic   dysfunction). - Aortic valve: Transvalvular velocity was within the normal range.   There was no stenosis. There was no regurgitation. - Mitral valve: Transvalvular velocity was within the normal range.   There was no evidence for stenosis. There was trivial   regurgitation. - Left atrium: The atrium was severely dilated. - Right ventricle: The cavity size was normal. Wall thickness was   normal. Systolic function was normal. - Atrial septum: No defect or patent foramen ovale was identified. - Tricuspid valve: There was mild regurgitation. - Pulmonary arteries: Systolic pressure was severely increased. PA   peak pressure: 62 mm Hg (S).  Impressions:  - Echo from 06/2016 was reviewed. Endomyocardial border definition   was poor in that study and echo contrast was not used. Therefore   it is challenging to determine whether there may have been some   hypokinesis of the inferolateral wall in that study as well.  LHC 11/12/16: No CAD.   LVEDP 22 mmHg.    Patient Profile     71 y.o. male with hypertension, hyperlipidemia, morbid obesity, and OSA not on CPAP here with new onset atrial fibrillation with rapid ventricular response and acute systolic and diastolic heart failure.  Assessment & Plan    # Paroxysmal atrial fibrillation: He presented in new onset atrial fibrillation.   He  maintained sinus rhythm on amiodarone plans for 400 mg twice daily for 5 g load thyroid function was normal.  Echo showed mildly reduced systolic function.  Left heart catheterization today showed normal coronary arteries.  We will switch heparin to Eliquis 5mg  bid. Consolidate metoprolol to succinate given his systolic dysfunction.  We discussed the importance of getting a new CPAP machine.  He expressed understanding.  # Acute systolic and diastolic heart failure: New this admission.  Respiratory status is stable but LVEDP was mildly elevated.  Consolidate metoprolol.  Start losartan 12.5mg  daily.  Will hold off on diuresis for now given that he just had a LHC and had AKI [email or web address]  # Hypertension: # PVCs: Blood pressure slightly above goal.  We will switch metoprolol to 50 mg of metoprolol succinate daily.  This should also help with his PVCs.    # Hyperlipidemia: He was on simvastatin prior to admission.  This was switched to atorvastatin on admission.  We will check fasting lipids in the morning to determine whether he can stay on simvastatin or switch to atorvastatin.    For questions or updates, please contact [place] Please consult [email or web address] for contact info under Cardiology/STEMI.      Signed, Skeet Latch, MD  11/12/2016, 6:00 PM

## 2016-11-12 NOTE — H&P (View-Only) (Signed)
Progress Note  Patient Name: Justin Tate Date of Encounter: 11/11/2016  Primary Cardiologist: Dr Acie Fredrickson  Subjective   No CP or dyspnea  Inpatient Medications    Scheduled Meds: . amiodarone  400 mg Oral BID  . [START ON 11/12/2016] aspirin  81 mg Oral Pre-Cath  . aspirin EC  81 mg Oral Daily  . atorvastatin  80 mg Oral q1800  . buPROPion  300 mg Oral Daily  . cholecalciferol  2,000 Units Oral Daily  . ferrous sulfate  325 mg Oral Q breakfast  . metoprolol tartrate  12.5 mg Oral BID  . mirabegron ER  25 mg Oral Daily  . multivitamin with minerals  1 tablet Oral Daily  . sodium chloride flush  3 mL Intravenous Q12H  . sodium chloride flush  3 mL Intravenous Q12H   Continuous Infusions: . sodium chloride 75 mL (11/10/16 0700)  . sodium chloride    . sodium chloride    . heparin 1,800 Units/hr (11/11/16 0512)  . nitroGLYCERIN 1.5 mcg/min (11/10/16 1900)   PRN Meds: sodium chloride, sodium chloride, acetaminophen, ALPRAZolam, nitroGLYCERIN, ondansetron (ZOFRAN) IV, sodium chloride flush, sodium chloride flush, zolpidem   Vital Signs    Vitals:   11/11/16 0629 11/11/16 0635 11/11/16 0705 11/11/16 0839  BP: (!) 125/57 120/79 127/70 128/71  Pulse:      Resp:      Temp:      TempSrc:      SpO2: 96% 95% 96%   Weight:      Height:        Intake/Output Summary (Last 24 hours) at 11/11/16 0942 Last data filed at 11/11/16 0700  Gross per 24 hour  Intake            622.5 ml  Output              695 ml  Net            -72.5 ml   Filed Weights   11/09/16 1900 11/10/16 0559 11/11/16 0622  Weight: (!) 166.1 kg (366 lb 2.9 oz) (!) 159.9 kg (352 lb 8.3 oz) (!) 157.5 kg (347 lb 3.6 oz)    Telemetry    Sinus with PVCs and 3 beats NSVT- Personally Reviewed   Physical Exam   GEN: WD Morbidly obese Neck: No JVD, supple Cardiac: RRR Respiratory: Clear to auscultation bilaterally. No wheeze GI: Soft, nontender, non-distended; exam difficult due to obesity MS: No  edema Neuro:  Nonfocal; grossly intact   Labs    Chemistry  Recent Labs Lab 11/09/16 0341 11/09/16 1706 11/10/16 0451 11/11/16 0627  NA 140  --  138 139  K 4.0  --  3.6 4.0  CL 106  --  101 102  CO2 19*  --  26 29  GLUCOSE 167*  --  95 94  BUN 24*  --  27* 20  CREATININE 2.11*  --  1.69* 1.33*  CALCIUM 8.8*  --  8.4* 8.8*  PROT  --  6.7  --   --   ALBUMIN  --  3.3*  --   --   AST  --  63*  --   --   ALT  --  30  --   --   ALKPHOS  --  64  --   --   BILITOT  --  0.6  --   --   GFRNONAA 30*  --  39* 53*  GFRAA 35*  --  46* >60  ANIONGAP 15  --  11 8     Hematology  Recent Labs Lab 11/10/16 0451 11/10/16 0845 11/11/16 0627  WBC 9.8 10.1 11.5*  RBC 4.53 4.58 4.66  HGB 13.1 12.8* 13.4  HCT 40.5 41.2 42.0  MCV 89.4 90.0 90.1  MCH 28.9 27.9 28.8  MCHC 32.3 31.1 31.9  RDW 14.6 14.8 14.5  PLT 144* 145* 116*    Cardiac Enzymes  Recent Labs Lab 11/09/16 0341 11/09/16 0754 11/09/16 1706 11/10/16 0451  TROPONINI 0.62* 3.77* 8.36* 4.97*     Recent Labs Lab 11/09/16 0337  TROPIPOC 0.48*     BNP  Recent Labs Lab 11/09/16 1706  BNP 656.2*       Patient Profile     71 y.o. male admitted with new onset atrial fibrillation and also ruled in for a non-ST elevation myocardial infarction. Also with renal insufficiency.  Assessment & Plan    1 paroxysmal atrial fibrillation-patient presented with new onset atrial fibrillation. He remains in sinus rhythm. Continue amiodarone 400 mg twice a day for 1 week and then 200 mg daily thereafter. I would plan to continue for 8 weeks and then discontinue to see if he hold sinus on his own. TSH normal. Await echocardiogram. CHADSvasc 4. Would begin apixaban 5 mg BID at DC.  2 non-ST elevation myocardial infarction-patient has ruled in. Continue aspirin, heparin, statin. Continue metoprolol 12.5 mg twice a day. DC NTG. For cardiac catheterization in AM. The risks and benefits including myocardial infarction, CVA and  death discussed and he agrees to proceed. I also explained the risk of worsening renal insufficiency. No ventriculogram. Check echocardiogram for LV function. Hydrate prior to procedure. Follow renal function closely afterwards.  3 acute on chronic stage III kidney disease-renal function improved this morning. Hydrate prior to cath.  4 hyperlipidemia-continue lipitor.  5 hypertension-blood pressure is controlled. Continue present meds.  6 morbid obesity-needs weight loss.  For questions or updates, please contact [place] Please consult [email or web address] for contact info under Cardiology/STEMI.      Signed, Kirk Ruths, MD  11/11/2016, 9:42 AM

## 2016-11-13 ENCOUNTER — Encounter (HOSPITAL_COMMUNITY): Payer: Self-pay | Admitting: Cardiovascular Disease

## 2016-11-13 LAB — CBC
HCT: 40 % (ref 39.0–52.0)
HEMOGLOBIN: 12.7 g/dL — AB (ref 13.0–17.0)
MCH: 28.4 pg (ref 26.0–34.0)
MCHC: 31.8 g/dL (ref 30.0–36.0)
MCV: 89.5 fL (ref 78.0–100.0)
Platelets: 147 10*3/uL — ABNORMAL LOW (ref 150–400)
RBC: 4.47 MIL/uL (ref 4.22–5.81)
RDW: 14.2 % (ref 11.5–15.5)
WBC: 10.4 10*3/uL (ref 4.0–10.5)

## 2016-11-13 LAB — BASIC METABOLIC PANEL
ANION GAP: 8 (ref 5–15)
BUN: 19 mg/dL (ref 6–20)
CALCIUM: 8.7 mg/dL — AB (ref 8.9–10.3)
CO2: 26 mmol/L (ref 22–32)
Chloride: 104 mmol/L (ref 101–111)
Creatinine, Ser: 1.15 mg/dL (ref 0.61–1.24)
Glucose, Bld: 149 mg/dL — ABNORMAL HIGH (ref 65–99)
POTASSIUM: 3.7 mmol/L (ref 3.5–5.1)
SODIUM: 138 mmol/L (ref 135–145)

## 2016-11-13 LAB — LIPID PANEL
CHOL/HDL RATIO: 2.9 ratio
CHOLESTEROL: 142 mg/dL (ref 0–200)
HDL: 49 mg/dL (ref >40)
LDL Cholesterol: 77 mg/dL (ref 0–99)
TRIGLYCERIDES: 79 mg/dL (ref <150)
VLDL: 16 mg/dL (ref 0–40)

## 2016-11-13 MED ORDER — APIXABAN 5 MG PO TABS: 5.0000 mg | ORAL_TABLET | Freq: Two times a day (BID) | ORAL | 0 refills | Status: DC

## 2016-11-13 MED ORDER — ATORVASTATIN CALCIUM 20 MG PO TABS: 20.0000 mg | ORAL_TABLET | Freq: Every day | ORAL | Status: DC

## 2016-11-13 MED ORDER — SIMVASTATIN 20 MG PO TABS: 40.0000 mg | ORAL_TABLET | Freq: Every day | ORAL | Status: DC

## 2016-11-13 MED ORDER — FUROSEMIDE 20 MG PO TABS: 20.0000 mg | ORAL_TABLET | Freq: Every day | ORAL | 2 refills | Status: DC

## 2016-11-13 MED ORDER — AMIODARONE HCL 400 MG PO TABS: 400.0000 mg | ORAL_TABLET | Freq: Two times a day (BID) | ORAL | 0 refills | Status: DC

## 2016-11-13 MED ORDER — FUROSEMIDE 20 MG PO TABS
20.0000 mg | ORAL_TABLET | Freq: Every day | ORAL | Status: DC
  Administered 2016-11-13: 20 mg via ORAL
  Filled 2016-11-13: qty 1

## 2016-11-13 MED ORDER — ROSUVASTATIN CALCIUM 5 MG PO TABS: 5.0000 mg | ORAL_TABLET | Freq: Every day | ORAL | 11 refills | Status: AC

## 2016-11-13 MED ORDER — METOPROLOL SUCCINATE ER 50 MG PO TB24: 50.0000 mg | ORAL_TABLET | Freq: Every day | ORAL | 2 refills | Status: DC

## 2016-11-13 MED ORDER — LOSARTAN POTASSIUM 50 MG PO TABS: 25.0000 mg | ORAL_TABLET | Freq: Every day | ORAL | Status: DC

## 2016-11-13 MED ORDER — APIXABAN 5 MG PO TABS
5.0000 mg | ORAL_TABLET | Freq: Two times a day (BID) | ORAL | Status: DC
  Administered 2016-11-13: 5 mg via ORAL
  Filled 2016-11-13: qty 1

## 2016-11-13 MED ORDER — AMIODARONE HCL 200 MG PO TABS: 200.0000 mg | ORAL_TABLET | Freq: Every day | ORAL | 5 refills | Status: DC

## 2016-11-13 MED ORDER — LOSARTAN POTASSIUM 25 MG PO TABS: 25.0000 mg | ORAL_TABLET | Freq: Every day | ORAL | 2 refills | Status: AC

## 2016-11-13 MED ORDER — APIXABAN 5 MG PO TABS: 5.0000 mg | ORAL_TABLET | Freq: Two times a day (BID) | ORAL | 11 refills | Status: DC

## 2016-11-13 MED FILL — Heparin Sodium (Porcine) Inj 1000 Unit/ML: INTRAMUSCULAR | Qty: 10 | Status: AC

## 2016-11-13 NOTE — Progress Notes (Addendum)
CARDIAC REHAB PHASE I   PRE:  Rate/Rhythm: 82 SR  BP:  Sitting: 166/77        SaO2: 91 RA  MODE:  Ambulation: 60 ft   POST:  Rate/Rhythm: 109 ST c/ freq PVCs  BP:  Sitting: 106/58         SaO2: 88-89 during ambulation, 85 once seated upon return to room, quickly rose to 90% on RA  Pt in recliner, sats 90-91% on RA at rest. Pt required attempt x3 to stand with assistance. Pt ambulated 60 ft on RA, rolling walker, handheld assist, slow, mostly steady gait, tolerated fair.  Pt c/o DOE, weakness in his legs, fatigue with distance, standing rest x2. Pt fairly deconditioned, states his activity is generally limited to walking in his house, pt would benefit from [facility] for disease management. Completed demand MI/CHF/new a fib education with pt at bedside.  Reviewed risk factors, "off the beat book," CHF booklet and zone tool, daily weights, sodium restrictions, heart healthy and diabetes diet handouts, exercise and phase 2 cardiac rehab. Pt verbalized understanding. Pt agrees to phase 2 cardiac rehab referral, will send to [facility]. Pt to recliner after walk, call bell within reach.    [id number] Lenna Sciara, RN, BSN 11/13/2016 9:41 AM

## 2016-11-13 NOTE — Care Management Important Message (Signed)
Important Message  Patient Details  Name: Justin Tate MRN: [id number] Date of Birth: Jun 12, 1945   Medicare Important Message Given:  Yes    [name] 11/13/2016, 11:33 AM

## 2016-11-13 NOTE — Progress Notes (Signed)
Progress Note  Patient Name: Justin Tate Date of Encounter: 11/13/2016  Primary Cardiologist: Dr. Acie Fredrickson  Subjective   Feeling well.  Denies chest pain or palpitations.  Breathing stable.  Inpatient Medications    Scheduled Meds: . amiodarone  400 mg Oral BID  . aspirin  81 mg Oral Daily  . atorvastatin  80 mg Oral q1800  . buPROPion  300 mg Oral Daily  . cholecalciferol  2,000 Units Oral Daily  . ferrous sulfate  325 mg Oral Q breakfast  . losartan  12.5 mg Oral Daily  . metoprolol succinate  50 mg Oral Daily  . mirabegron ER  25 mg Oral Daily  . multivitamin with minerals  1 tablet Oral Daily  . sodium chloride flush  3 mL Intravenous Q12H  . sodium chloride flush  3 mL Intravenous Q12H   Continuous Infusions: . sodium chloride 75 mL (11/10/16 0700)  . sodium chloride     PRN Meds: sodium chloride, sodium chloride, acetaminophen, ALPRAZolam, morphine injection, nitroGLYCERIN, ondansetron (ZOFRAN) IV, sodium chloride flush, sodium chloride flush, zolpidem   Vital Signs    Vitals:   11/12/16 2100 11/12/16 2247 11/13/16 0303 11/13/16 0800  BP: 129/90 (!) 144/69 (!) 126/50 (!) 164/69  Pulse:  78 74   Resp: (!) 21  (!) 21   Temp:   98.1 F (36.7 C) 97.9 F (36.6 C)  TempSrc:   Oral Oral  SpO2:   97%   Weight:      Height:        Intake/Output Summary (Last 24 hours) at 11/13/16 1047 Last data filed at 11/13/16 0700  Gross per 24 hour  Intake              300 ml  Output             2100 ml  Net            -1800 ml   Filed Weights   11/10/16 0559 11/11/16 0622 11/12/16 0330  Weight: (!) 159.9 kg (352 lb 8.3 oz) (!) 157.5 kg (347 lb 3.6 oz) (!) 158.9 kg (350 lb 5 oz)    Telemetry    Sinus rhythm.  Frequent PVCs.  Ventricular bigeminy.- Personally Reviewed  ECG    N/a- Personally Reviewed  Physical Exam   VS:  BP (!) 164/69 (BP Location: Right Arm)   Pulse 74   Temp 97.9 F (36.6 C) (Oral)   Resp (!) 21   Ht 5\' 9"  (1.753 m)   Wt (!) 158.9 kg  (350 lb 5 oz)   SpO2 97%   BMI 51.73 kg/m  , BMI Body mass index is 51.73 kg/m. GENERAL:  Well appearing.  No acute distress.  HEENT: Pupils equal round and reactive, fundi not visualized, oral mucosa unremarkable NECK:  JVP 1cm above clavicle sitting upright. Waveform within normal limits, carotid upstroke brisk and symmetric, no bruits, no thyromegaly LYMPHATICS:  No cervical adenopathy LUNGS:  Clear to auscultation bilaterally HEART:  RRR.  PMI not displaced or sustained,S1 and S2 within normal limits, no S3, no S4, no clicks, no rubs, no murmurs ABD:  Morbidly obese. Positive bowel sounds normal in frequency in pitch, no bruits, no rebound, no guarding, no midline pulsatile mass, no hepatomegaly, no splenomegaly EXT:  2 plus pulses throughout, 1+ pitting edema bilaterally L>R, no cyanosis no clubbing.  R radial cath site stabel.  SKIN:  No rashes no nodules NEURO:  Cranial nerves II through XII grossly intact, motor  grossly intact throughout [facility]:  Cognitively intact, oriented to person place and time   [place] Lab 11/09/16 1706  11/11/16 0627 11/12/16 0503 11/13/16 0832  NA  --   < > 139 135 138  K  --   < > 4.0 4.1 3.7  CL  --   < > 102 103 104  CO2  --   < > 29 25 26   GLUCOSE  --   < > 94 96 149*  BUN  --   < > 20 17 19   CREATININE  --   < > 1.33* 1.15 1.15  CALCIUM  --   < > 8.8* 8.3* 8.7*  PROT 6.7  --   --   --   --   ALBUMIN 3.3*  --   --   --   --   AST 63*  --   --   --   --   ALT 30  --   --   --   --   ALKPHOS 64  --   --   --   --   BILITOT 0.6  --   --   --   --   GFRNONAA  --   < > 53* >60 >60  GFRAA  --   < > >60 >60 >60  ANIONGAP  --   < > 8 7 8   < > = values in this interval not displayed.   Hematology  Recent Labs Lab 11/11/16 0627 11/12/16 0503 11/13/16 0832  WBC 11.5* 12.0* 10.4  RBC 4.66 4.52 4.47  HGB 13.4 12.8* 12.7*  HCT 42.0 40.8 40.0  MCV 90.1 90.3 89.5  MCH 28.8 28.3 28.4  MCHC 31.9 31.4 31.8  RDW 14.5  14.4 14.2  PLT 116* 155 147*    Cardiac Enzymes  Recent Labs Lab 11/09/16 0341 11/09/16 0754 11/09/16 1706 11/10/16 0451  TROPONINI 0.62* 3.77* 8.36* 4.97*     Recent Labs Lab 11/09/16 0337  TROPIPOC 0.48*     BNP  Recent Labs Lab 11/09/16 1706  BNP 656.2*     DDimer No results for input(s): DDIMER in the last 168 hours.   Radiology    No results found.  Cardiac Studies   Echo 11/12/16: Study Conclusions  - Left ventricle: The cavity size was normal. There was moderate   concentric hypertrophy. Systolic function was mildly reduced. The   estimated ejection fraction was in the range of 45% to 50%.   Diffuse hypokinesis. Features are consistent with a pseudonormal   left ventricular filling pattern, with concomitant abnormal   relaxation and increased filling pressure (grade 2 diastolic   dysfunction). - Aortic valve: Transvalvular velocity was within the normal range.   There was no stenosis. There was no regurgitation. - Mitral valve: Transvalvular velocity was within the normal range.   There was no evidence for stenosis. There was trivial   regurgitation. - Left atrium: The atrium was severely dilated. - Right ventricle: The cavity size was normal. Wall thickness was   normal. Systolic function was normal. - Atrial septum: No defect or patent foramen ovale was identified. - Tricuspid valve: There was mild regurgitation. - Pulmonary arteries: Systolic pressure was severely increased. PA   peak pressure: 62 mm Hg (S).  Impressions:  - Echo from 06/2016 was reviewed. Endomyocardial border definition   was poor in that study and echo contrast was not used. Therefore   it is challenging to determine whether  there may have been some   hypokinesis of the inferolateral wall in that study as well.  LHC 11/12/16: No CAD.   LVEDP 22 mmHg.    Patient Profile     71 y.o. male with hypertension, hyperlipidemia, morbid obesity, and OSA not on CPAP here  with new onset atrial fibrillation with rapid ventricular response and acute systolic and diastolic heart failure.  Assessment & Plan    # Paroxysmal atrial fibrillation: He presented in new onset atrial fibrillation.  He is maintaining sinus rhythm on amiodarone with plans for 400 mg twice daily through 10/26.  Then 200mg  daily.  Thyroid function was normal.  Echo showed mildly reduced systolic function.  Left heart catheterization showed normal coronary arteries.  We will switch heparin to Eliquis 5mg  bid. Consolidate metoprolol to succinate given his systolic dysfunction.  We discussed the importance of getting a new CPAP machine.  He expressed understanding.  # Acute systolic and diastolic heart failure: New this admission.  Respiratory status is stable but LVEDP was mildly elevated and he his mildly volume overloaded on exam.  Start lasix 20mg  daily.  Metoprolol was consolidated today.  Increase losartan to 25mg  daily.  # Hypertension: # PVCs: Metoprolol and losartan.  # Hyperlipidemia: He was on simvastatin prior to admission.  LDL 77 this hospitalization.  Given the lack of CAD on cath, continue home simvastatin.  # Pre-diabetes: Hemoglobin A1c 6.3%.  Follow up with PCP.     For questions or updates, please contact [place] Please consult [email or web address] for contact info under Cardiology/STEMI.      Signed, Skeet Latch, MD  11/13/2016, 10:47 AM

## 2016-11-13 NOTE — Evaluation (Signed)
Physical Therapy Evaluation Patient Details Name: Justin Tate MRN: [id number] DOB: 02/14/1945 Today's Date: 11/13/2016   History of Present Illness  Pt is a 71 y/o male admitted secondary to feelings of his heart racing, nausea, and R shoulder pain. Pt found to be in a fib with RVR, found to have acute CHF, and found to have NSTEMI s/p L heart cath which showed normal coronary artery. PMH includes HTN and CKD.   Clinical Impression  Pt admitted secondary to problem above with deficits below. PTA, pt reports he was using his cane or walker for ambulation depending on how he felt. Upon eval, pt very limited by fatiuge and only able to tolerate short ambulation distances; required min A for steadying during ambulation. Required max A and 5 attempts to stand using RW this session. Educated about current deficits and fall risk at home and educated about recommendations for SNF as pt does not have support at home. Recommending SNF, however, pt refusing so will need to max out [facility] services for d/c home in order to ensure safety with mobility (HHPT, [place], [facility]). Will continue to follow acutely to maximize functional mobility independence and safety.     Follow Up Recommendations Home health PT;Other (comment) (HHOT, [facility]; pt refusing SNF )    Equipment Recommendations  None recommended by PT    Recommendations for Other Services OT consult     Precautions / Restrictions Precautions Precautions: Fall Restrictions Weight Bearing Restrictions: No      Mobility  Bed Mobility               General bed mobility comments: In chair upon entry   Transfers Overall transfer level: Needs assistance Equipment used: Rolling walker (2 wheeled) Transfers: Sit to/from Stand Sit to Stand: Max assist         General transfer comment: Required 5 attempts to stand. Required max A for lift assist and steadying and extended time to complete.   Ambulation/Gait Ambulation/Gait assistance: Min  assist Ambulation Distance (Feet): 25 Feet Assistive device: Rolling walker (2 wheeled) Gait Pattern/deviations: Step-through pattern;Decreased stride length Gait velocity: Decreased Gait velocity interpretation: Below normal speed for age/gender General Gait Details: Min A for steadying assist as pt unsteady with use of RW. Very limited by fatigue this session and only able to tolerate short gait distance. Pt wearing flip flops and educated about importance of proper foot wear during ambulation to decrease fall risk.   Stairs            Wheelchair Mobility    Modified Rankin (Stroke Patients Only)       Balance Overall balance assessment: Needs assistance Sitting-balance support: No upper extremity supported;Feet supported Sitting balance-Leahy Scale: Fair     Standing balance support: Bilateral upper extremity supported;During functional activity Standing balance-Leahy Scale: Poor Standing balance comment: Reliant on UE support and external support.                              Pertinent Vitals/Pain Pain Assessment: Faces Faces Pain Scale: Hurts a little bit Pain Location: IV site  Pain Descriptors / Indicators: Grimacing Pain Intervention(s): Limited activity within patient's tolerance;Monitored during session;Repositioned    Home Living Family/patient expects to be discharged to:: Private residence Living Arrangements: Alone Available Help at Discharge: Other (Comment) (reports there is no one to check in ) Type of Home: House Home Access: Stairs to enter Entrance Stairs-Rails: Right;Left;Can reach both Entrance Stairs-Number of Steps:  3 Home Layout: One level Home Equipment: Walker - 2 wheels;Shower seat - built in;Grab bars - tub/shower;Grab bars - toilet;Hand held shower head;Cane - single point;Bedside commode      Prior Function Level of Independence: Independent with assistive device(s)         Comments: Using walker and cane with  ambulation depending on how he felt. Still driving.      Hand Dominance   Dominant Hand: Right    Extremity/Trunk Assessment   Upper Extremity Assessment Upper Extremity Assessment: Defer to OT evaluation    Lower Extremity Assessment Lower Extremity Assessment: LLE deficits/detail;RLE deficits/detail RLE Deficits / Details: Functional weakness noted and reports fatigue in calf.  RLE Sensation: history of peripheral neuropathy LLE Deficits / Details: Numbness from above the knee to below the ankle. Functional weakness noted and reports fatigue in calf.  LLE Sensation: history of peripheral neuropathy    Cervical / Trunk Assessment Cervical / Trunk Assessment: Kyphotic  Communication   Communication: No difficulties  Cognition Arousal/Alertness: Awake/alert Behavior During Therapy: WFL for tasks assessed/performed Overall Cognitive Status: Within Functional Limits for tasks assessed                                        General Comments General comments (skin integrity, edema, etc.): Educated about safety concerns at home and need for SNF, however, pt refusing at this time and reports he wants to go home.     Exercises     Assessment/Plan    PT Assessment Patient needs continued PT services  PT Problem List Decreased strength;Decreased activity tolerance;Decreased balance;Decreased mobility;Decreased knowledge of use of DME;Decreased safety awareness;Decreased knowledge of precautions;Cardiopulmonary status limiting activity;Obesity       PT Treatment Interventions DME instruction;Gait training;Stair training;Functional mobility training;Therapeutic activities;Therapeutic exercise;Balance training;Neuromuscular re-education;Patient/family education    PT Goals (Current goals can be found in the Care Plan section)  Acute Rehab PT Goals Patient Stated Goal: to go home  PT Goal Formulation: With patient Time For Goal Achievement: 11/20/16 Potential to  Achieve Goals: Fair    Frequency Min 3X/week   Barriers to discharge Decreased caregiver support Lives alone and no one able to check on him     Co-evaluation               AM-PAC PT "6 Clicks" Daily Activity  Outcome Measure Difficulty turning over in bed (including adjusting bedclothes, sheets and blankets)?: A Lot Difficulty moving from lying on back to sitting on the side of the bed? : A Lot Difficulty sitting down on and standing up from a chair with arms (e.g., wheelchair, bedside commode, etc,.)?: Unable Help needed moving to and from a bed to chair (including a wheelchair)?: A Little Help needed walking in hospital room?: A Little Help needed climbing 3-5 steps with a railing? : A Lot 6 Click Score: 13    End of Session Equipment Utilized During Treatment: Gait belt Activity Tolerance: Patient limited by fatigue Patient left: in chair;with call bell/phone within reach Nurse Communication: Mobility status PT Visit Diagnosis: Muscle weakness (generalized) (M62.81);Unsteadiness on feet (R26.81)    Time: 8563-1497 PT Time Calculation (min) (ACUTE ONLY): 24 min   Charges:   PT Evaluation $PT Eval Moderate Complexity: 1 Mod PT Treatments $Gait Training: 8-22 mins   PT G Codes:        Leighton Ruff, PT, DPT  Acute Rehabilitation Services  Pager:  [phone]   Rudean Hitt 11/13/2016, 12:28 PM

## 2016-11-13 NOTE — Care Management Note (Addendum)
Case Management Note  Patient Details  Name: Justin Tate MRN: [id number] Date of Birth: 10-Oct-1945  Subjective/Objective:   From home alone, presents with NSTEMI, s/p cath -normal CAD,will treat medically.   Per pt eval rec SNF, patient refusing SNF, he will need HHRN, HHPT, he chose [facility] , referral made to [facility], soc will begin 24-48 hrs post dc. Patient has a rolling walker in his room and a cane.  He will also need a cab voucher, Lorriane Shire CSW is getting this for patient, he will also need to go by [facility] on [address] to pick up meds while in cab.  Nere to call Lorriane Shire when patient is ready for dc today.     10/23 De Lame[place], BSN - NCM received call from [facility] stating they are starting patient on Eliquis, NCM awaiting benefit   .                        Pt is currently taking this med and not eligible for a refill until 11/15, however pt copay is $42 and is aware of that  Action/Plan:   Expected Discharge Date:  11/13/16               Expected Discharge Plan:  [place]  In-House Referral:  Clinical Social Work  Discharge planning Services  CM Consult  Post Acute Care Choice:  Home Health Choice offered to:  Patient  DME Arranged:    DME Agency:     HH Arranged:  RN, PT [place] Agency:  [place]  Status of Service:  Completed, signed off  If discussed at [place] of Stay Meetings, dates discussed:    Additional Comments:  Zenon Mayo, RN 11/13/2016, 11:45 AM

## 2016-11-13 NOTE — Discharge Instructions (Addendum)

## 2016-11-13 NOTE — Discharge Summary (Signed)
Discharge Summary    Patient ID: Justin Tate,  MRN: [id number], DOB/AGE: February 05, 1945 71 y.o.  Admit date: 11/09/2016 Discharge date: 11/13/2016  Primary Care Provider: Lawerance Cruel Primary Cardiologist: Dr. Acie Fredrickson  Discharge Diagnoses    Principal Problem:   NSTEMI (non-ST elevated myocardial infarction) [facility]) Active Problems:   Tachyarrhythmia   Chronic diastolic CHF (congestive heart failure), NYHA class 3 (HCC)   AKI (acute kidney injury) ([place])   Allergies Allergies  Allergen Reactions  . No Known Allergies     Diagnostic Studies/Procedures    Left heart cath 11/12/2016 IMPRESSION:Mr. Life has normal coronary arteries and mildly elevated LVEDP. I believe that his troponin elevation was related to demand ischemia from A. Fib with RVR. Continue medical therapy will be recommended.The sheath was removed and pressure will be held on the right groin to achieve hemostasis. The patient left the lab in stable condition. Because of his size and going to put him on Unit 6C for  closer groin observation. _____________  Echocardiogram 11/11/2016 Study Conclusions - Left ventricle: The cavity size was normal. There was moderate   concentric hypertrophy. Systolic function was mildly reduced. The   estimated ejection fraction was in the range of 45% to 50%.   Diffuse hypokinesis. Features are consistent with a pseudonormal   left ventricular filling pattern, with concomitant abnormal   relaxation and increased filling pressure (grade 2 diastolic   dysfunction). - Aortic valve: Transvalvular velocity was within the normal range.   There was no stenosis. There was no regurgitation. - Mitral valve: Transvalvular velocity was within the normal range.   There was no evidence for stenosis. There was trivial   regurgitation. - Left atrium: The atrium was severely dilated. - Right ventricle: The cavity size was normal. Wall thickness was   normal. Systolic function was  normal. - Atrial septum: No defect or patent foramen ovale was identified. - Tricuspid valve: There was mild regurgitation. - Pulmonary arteries: Systolic pressure was severely increased. PA   peak pressure: 62 mm Hg (S).  Impressions: - Echo from 06/2016 was reviewed. Endomyocardial border definition   was poor in that study and echo contrast was not used. Therefore   it is challenging to determine whether there may have been some   hypokinesis of the inferolateral wall in that study as well.   History of Present Illness     Justin Tate a 71 y.o.malewith a hx of HTN, HLD, OSA s/p ablation L greater SV & improved>>now is going back on it, venous stasis ulcers w/ deep venous reflux, LBBB, OA, morbid obesity,who is being seen today for the evaluation of CP, elevated troponin and arrhythmiaat the request of Dr Florina Ou. He had arthroscopic knee surgery on 07/11/16. Since the surgery, he has been working on increasing his activity. He tries to walk around the house, has not been out much. He is using a walker. Does not stick to a low-Na diet. Chronic LE edema, orthopnea, denies PND. +Nocturia. Doest not weigh daily.Felt respiratory status was at baseline.   On 11/09/16 he presented for shortness of breath, nausea and weakness. He drove himself but got very weak walking in the door. He felt better with rest.   He was found to be in atrial fib with RVR. Appeared to have had a NSTEMI - I suspect Type 1, rather than type 2. Started on amiodarone and heparin.    Hospital Course     Consultants: None  # Paroxysmal atrial fibrillation: He  presented in new onset atrial fibrillation.  He is maintaining sinus rhythm on amiodarone with plans for 400 mg twice daily through 10/26. Then 200 mg daily. Thyroid function was normal.  Echo showed mildly reduced systolic function.  Left heart catheterization showed normal coronary arteries.  We will switch heparin to Eliquis 5mg  bid. Consolidated metoprolol  to succinate given his systolic dysfunction.  We discussed the importance of getting a new CPAP machine.  He expressed understanding.  # Acute systolic and diastolic heart failure: New this admission.  Respiratory status is stable but LVEDP was mildly elevated and he his mildly volume overloaded on exam.  Start lasix 20mg  daily.  Metoprolol was consolidated today.  Increase losartan to 25mg  daily.  # Hypertension: # PVCs: Metoprolol and losartan.  # Hyperlipidemia: He was on simvastatin prior to admission.  LDL 77 this hospitalization.  Due to possible interaction between simvastatin and amiodarone will switch statin to rosuvastatin in equivalent dose since no CAD.  # Pre-diabetes: Hemoglobin A1c 6.3%.  Follow up with PCP.   SCr 1.15,  K+ 3.7, Hgb 12.7 on day of discharge.   Pt was evaluated by PT who recommended SNF, but pt declined, instead preferring home health services which have been arranged.   Patient has been seen by Dr. Oval Linsey today and deemed ready for discharge home. All follow up appointments have been scheduled. Discharge medications are listed below. _____________  Discharge Vitals Blood pressure (!) 163/50, pulse 75, temperature 98.3 F (36.8 C), temperature source Oral, resp. rate (!) 25, height 5\' 9"  (1.753 m), weight (!) 350 lb 5 oz (158.9 kg), SpO2 93 %.  Filed Weights   11/10/16 0559 11/11/16 0622 11/12/16 0330  Weight: (!) 352 lb 8.3 oz (159.9 kg) (!) 347 lb 3.6 oz (157.5 kg) (!) 350 lb 5 oz (158.9 kg)    Labs & Radiologic Studies    CBC  Recent Labs  11/12/16 0503 11/13/16 0832  WBC 12.0* 10.4  HGB 12.8* 12.7*  HCT 40.8 40.0  MCV 90.3 89.5  PLT 155 409*   Basic Metabolic Panel  Recent Labs  11/12/16 0503 11/13/16 0832  NA 135 138  K 4.1 3.7  CL 103 104  CO2 25 26  GLUCOSE 96 149*  BUN 17 19  CREATININE 1.15 1.15  CALCIUM 8.3* 8.7*  MG 1.8  --    Liver Function Tests No results for input(s): AST, ALT, ALKPHOS, BILITOT, PROT,  ALBUMIN in the last 72 hours. No results for input(s): LIPASE, AMYLASE in the last 72 hours. Cardiac Enzymes No results for input(s): CKTOTAL, CKMB, CKMBINDEX, TROPONINI in the last 72 hours. BNP Invalid input(s): POCBNP D-Dimer No results for input(s): DDIMER in the last 72 hours. Hemoglobin A1C No results for input(s): HGBA1C in the last 72 hours. Fasting Lipid Panel  Recent Labs  11/13/16 0832  CHOL 142  HDL 49  LDLCALC 77  TRIG 79  CHOLHDL 2.9   Thyroid Function Tests  Recent Labs  11/11/16 0627  TSH 1.980   _____________  Dg Chest Port 1 View  Result Date: 11/09/2016 CLINICAL DATA:  Heart palpitations and shortness of breath. EXAM: PORTABLE CHEST 1 VIEW COMPARISON:  07/03/2016 FINDINGS: Shallow inspiration. Normal heart size and pulmonary vascularity. No focal airspace disease or consolidation in the lungs. No blunting of costophrenic angles. No pneumothorax. Mediastinal contours appear intact. IMPRESSION: No active disease. Electronically Signed   By: Lucienne Capers M.D.   On: 11/09/2016 04:27   Disposition   Pt is being  discharged home today in good condition.  Follow-up Plans & Appointments    Follow-up Information    [facility].   Why:  HHRN, HHPT Contact information: [place] [address] [phone]        Leanor Kail, [place] Follow up.   Specialty:  Cardiology Why:  On November 14th at 9:30 for cardiology hospital follow up.  Contact information: [address] [place]  [address] [phone]          Discharge Instructions    (HEART FAILURE PATIENTS) Call MD:  Anytime you have any of the following symptoms: 1) 3 pound weight gain in 24 hours or 5 pounds in 1 week 2) shortness of breath, with or without a dry hacking cough 3) swelling in the hands, feet or stomach 4) if you have to sleep on extra pillows at night in order to breathe.    Complete by:  As directed    Amb  Referral to Cardiac Rehabilitation    Complete by:  As directed    Diagnosis:  NSTEMI   Diet - low sodium heart healthy    Complete by:  As directed    Discharge instructions    Complete by:  As directed    PLEASE REMEMBER TO BRING ALL OF YOUR MEDICATIONS TO EACH OF YOUR FOLLOW-UP OFFICE VISITS.  PLEASE ATTEND ALL SCHEDULED FOLLOW-UP APPOINTMENTS.   Activity: Increase activity slowly as tolerated. You may shower, but no soaking baths (or swimming) for 1 week. No driving for 24 hours. No lifting over 5 lbs for 1 week. No sexual activity for 1 week.   You May Return to Work: in 1 week (if applicable)  Wound Care: You may wash cath site gently with soap and water. Keep cath site clean and dry. If you notice pain, swelling, bleeding or pus at your cath site, please call [phone].   Increase activity slowly    Complete by:  As directed       Discharge Medications   Current Discharge Medication List    START taking these medications   Details  !! amiodarone (PACERONE) 200 MG tablet Take 1 tablet (200 mg total) by mouth daily. Qty: 30 tablet, Refills: 5    !! amiodarone (PACERONE) 400 MG tablet Take 1 tablet (400 mg total) by mouth 2 (two) times daily. Qty: 7 tablet, Refills: 0    apixaban (ELIQUIS) 5 MG TABS tablet Take 1 tablet (5 mg total) by mouth 2 (two) times daily. Qty: 60 tablet, Refills: 0    furosemide (LASIX) 20 MG tablet Take 1 tablet (20 mg total) by mouth daily. Qty: 90 tablet, Refills: 2    losartan (COZAAR) 25 MG tablet Take 1 tablet (25 mg total) by mouth daily. Qty: 90 tablet, Refills: 2    metoprolol succinate (TOPROL-XL) 50 MG 24 hr tablet Take 1 tablet (50 mg total) by mouth daily. Take with or immediately following a meal. Qty: 90 tablet, Refills: 2    rosuvastatin (CRESTOR) 5 MG tablet Take 1 tablet (5 mg total) by mouth at bedtime. Qty: 30 tablet, Refills: 11     !! - Potential duplicate medications found. Please discuss with provider.      CONTINUE these medications which have NOT CHANGED   Details  acetaminophen (TYLENOL) 650 MG CR tablet Take 1,300 mg by mouth every 8 (eight) hours as needed for pain.    amoxicillin-clavulanate (AUGMENTIN) 875-125 MG tablet Take 1 tablet by mouth  2 (two) times daily as needed (takes for infection in ankle as needed).    aspirin EC 81 MG tablet Take 81 mg by mouth daily.    buPROPion (WELLBUTRIN XL) 300 MG 24 hr tablet Take 300 mg by mouth daily.    Cholecalciferol (VITAMIN D3) 2000 UNITS TABS Take 1 tablet by mouth daily.     ferrous sulfate 325 (65 FE) MG tablet Take 325 mg by mouth daily with breakfast.    meloxicam (MOBIC) 15 MG tablet Take 15 mg by mouth daily. with food Refills: 1    Multiple Vitamin (MULTIVITAMIN) tablet Take 1 tablet by mouth daily.    MYRBETRIQ 25 MG TB24 tablet Take 25 mg by mouth daily. Refills: 3      STOP taking these medications     simvastatin (ZOCOR) 40 MG tablet            Outstanding Labs/Studies   Will need follow up BMP as his ARB was increased.   Duration of Discharge Encounter   Greater than 30 minutes including physician time.  Signed, Daune Perch NP 11/13/2016, 3:00 PM

## 2016-11-13 NOTE — Progress Notes (Signed)
ANTICOAGULATION CONSULT NOTE - Initial Consult  Pharmacy Consult for apixaban Indication: atrial fibrillation  Allergies  Allergen Reactions  . No Known Allergies     Patient Measurements: Height: 5\' 9"  (175.3 cm) Weight: (!) 350 lb 5 oz (158.9 kg) IBW/kg (Calculated) : 70.7  Vital Signs: Temp: 97.9 F (36.6 C) (10/23 0800) Temp Source: Oral (10/23 0800) BP: 164/69 (10/23 0800) Pulse Rate: 74 (10/23 0303)  Labs:  Recent Labs  11/11/16 0627 11/12/16 0503 11/12/16 1533 11/13/16 0832  HGB 13.4 12.8*  --  12.7*  HCT 42.0 40.8  --  40.0  PLT 116* 155  --  147*  LABPROT 15.2  --   --   --   INR 1.20  --   --   --   HEPARINUNFRC 0.22* 0.14* <0.10*  --   CREATININE 1.33* 1.15  --  1.15    Estimated Creatinine Clearance: 89.6 mL/min (by C-G formula based on SCr of 1.15 mg/dL).   Medical History: Past Medical History:  Diagnosis Date  . Acute on chronic diastolic CHF (congestive heart failure), NYHA class 4 ([place]) 11/09/2016  . AKI (acute kidney injury) ([place]) 11/09/2016  . Anemia   . Arthritis   . Cataracts, bilateral   . Chronic diastolic CHF (congestive heart failure), NYHA class 3 ([place]) 11/09/2016  . Colon polyps   . Depression   . Dyspnea   . Hyperlipidemia   . Hypertension   . NSTEMI (non-ST elevated myocardial infarction) ([place]) 11/09/2016  . Pneumonia    hx  . Sleep apnea    dx 25 yrs ago used cpap 12 yrs or so-  not used 6-7 yrs  . Varicose veins     Medications:  Scheduled:  . amiodarone  400 mg Oral BID  . apixaban  5 mg Oral BID  . aspirin  81 mg Oral Daily  . buPROPion  300 mg Oral Daily  . cholecalciferol  2,000 Units Oral Daily  . ferrous sulfate  325 mg Oral Q breakfast  . furosemide  20 mg Oral Daily  . [START ON 11/14/2016] losartan  25 mg Oral Daily  . metoprolol succinate  50 mg Oral Daily  . mirabegron ER  25 mg Oral Daily  . multivitamin with minerals  1 tablet Oral Daily  . simvastatin  40 mg Oral q1800  . sodium chloride flush   3 mL Intravenous Q12H  . sodium chloride flush  3 mL Intravenous Q12H    Assessment: 22 yom presenting with new onset atrial fibrillation. Was previously on heparin infusion prior to cath, which showed normal coronary arteries.  Cardiology consulted to transition to apixaban. Heparin infusion was stopped last night.   Due to age<80 year, body weight>60 kg, and Scr<1.5, dose will be 5 mg twice daily. CBC is stable and no signs/symptoms of bleeding.   Goal of Therapy:  Monitor platelets by anticoagulation protocol: Yes   Plan:  Apixaban 5 mg twice daily Monitor renal function, signs/symptoms of bleeding   Doylene Canard, PharmD Clinical Pharmacist  Pager: [phone] Clinical Phone for 11/13/2016 until 3:30pm: [phone] If after 3:30pm, please call main pharmacy at [phone] 11/13/2016,12:12 PM

## 2016-11-14 ENCOUNTER — Emergency Department (HOSPITAL_COMMUNITY)
Admission: EM | Admit: 2016-11-14 | Discharge: 2016-11-14 | Disposition: A | Discharge status: Home / Self Care | Payer: Medicare HMO | Attending: Emergency Medicine | Admitting: Emergency Medicine

## 2016-11-14 ENCOUNTER — Emergency Department (HOSPITAL_COMMUNITY): Payer: Medicare HMO

## 2016-11-14 ENCOUNTER — Encounter (HOSPITAL_COMMUNITY): Payer: Self-pay | Admitting: Emergency Medicine

## 2016-11-14 ENCOUNTER — Telehealth (HOSPITAL_COMMUNITY): Payer: Self-pay

## 2016-11-14 DIAGNOSIS — I5032 Chronic diastolic (congestive) heart failure: Secondary | ICD-10-CM | POA: Insufficient documentation

## 2016-11-14 DIAGNOSIS — R531 Weakness: Secondary | ICD-10-CM

## 2016-11-14 DIAGNOSIS — G4733 Obstructive sleep apnea (adult) (pediatric): Secondary | ICD-10-CM | POA: Diagnosis not present

## 2016-11-14 DIAGNOSIS — D649 Anemia, unspecified: Secondary | ICD-10-CM | POA: Diagnosis not present

## 2016-11-14 DIAGNOSIS — R69 Illness, unspecified: Secondary | ICD-10-CM | POA: Diagnosis not present

## 2016-11-14 DIAGNOSIS — N289 Disorder of kidney and ureter, unspecified: Secondary | ICD-10-CM | POA: Diagnosis not present

## 2016-11-14 DIAGNOSIS — E782 Mixed hyperlipidemia: Secondary | ICD-10-CM | POA: Diagnosis not present

## 2016-11-14 DIAGNOSIS — R404 Transient alteration of awareness: Secondary | ICD-10-CM | POA: Diagnosis not present

## 2016-11-14 DIAGNOSIS — I248 Other forms of acute ischemic heart disease: Secondary | ICD-10-CM | POA: Diagnosis not present

## 2016-11-14 DIAGNOSIS — D638 Anemia in other chronic diseases classified elsewhere: Secondary | ICD-10-CM | POA: Diagnosis not present

## 2016-11-14 DIAGNOSIS — N393 Stress incontinence (female) (male): Secondary | ICD-10-CM | POA: Diagnosis not present

## 2016-11-14 DIAGNOSIS — I5040 Unspecified combined systolic (congestive) and diastolic (congestive) heart failure: Secondary | ICD-10-CM | POA: Diagnosis not present

## 2016-11-14 DIAGNOSIS — Z7982 Long term (current) use of aspirin: Secondary | ICD-10-CM | POA: Diagnosis not present

## 2016-11-14 DIAGNOSIS — I480 Paroxysmal atrial fibrillation: Secondary | ICD-10-CM | POA: Diagnosis not present

## 2016-11-14 DIAGNOSIS — N179 Acute kidney failure, unspecified: Secondary | ICD-10-CM | POA: Diagnosis not present

## 2016-11-14 DIAGNOSIS — I10 Essential (primary) hypertension: Secondary | ICD-10-CM | POA: Diagnosis not present

## 2016-11-14 DIAGNOSIS — I110 Hypertensive heart disease with heart failure: Secondary | ICD-10-CM | POA: Insufficient documentation

## 2016-11-14 DIAGNOSIS — L03115 Cellulitis of right lower limb: Secondary | ICD-10-CM

## 2016-11-14 DIAGNOSIS — I214 Non-ST elevation (NSTEMI) myocardial infarction: Secondary | ICD-10-CM | POA: Diagnosis not present

## 2016-11-14 DIAGNOSIS — Z79899 Other long term (current) drug therapy: Secondary | ICD-10-CM | POA: Insufficient documentation

## 2016-11-14 DIAGNOSIS — R1084 Generalized abdominal pain: Secondary | ICD-10-CM | POA: Diagnosis not present

## 2016-11-14 DIAGNOSIS — M170 Bilateral primary osteoarthritis of knee: Secondary | ICD-10-CM | POA: Diagnosis not present

## 2016-11-14 DIAGNOSIS — R0602 Shortness of breath: Secondary | ICD-10-CM | POA: Diagnosis not present

## 2016-11-14 DIAGNOSIS — Z6841 Body Mass Index (BMI) 40.0 and over, adult: Secondary | ICD-10-CM | POA: Diagnosis not present

## 2016-11-14 DIAGNOSIS — I4891 Unspecified atrial fibrillation: Secondary | ICD-10-CM | POA: Diagnosis not present

## 2016-11-14 DIAGNOSIS — R7303 Prediabetes: Secondary | ICD-10-CM | POA: Diagnosis not present

## 2016-11-14 DIAGNOSIS — R05 Cough: Secondary | ICD-10-CM | POA: Diagnosis not present

## 2016-11-14 DIAGNOSIS — M25561 Pain in right knee: Secondary | ICD-10-CM | POA: Diagnosis not present

## 2016-11-14 LAB — URINALYSIS, ROUTINE W REFLEX MICROSCOPIC
BILIRUBIN URINE: NEGATIVE
Glucose, UA: NEGATIVE mg/dL
Hgb urine dipstick: NEGATIVE
Ketones, ur: 5 mg/dL — AB
Leukocytes, UA: NEGATIVE
Nitrite: NEGATIVE
PROTEIN: NEGATIVE mg/dL
Specific Gravity, Urine: 1.019 (ref 1.005–1.030)
pH: 7 (ref 5.0–8.0)

## 2016-11-14 LAB — CBC
HCT: 41.2 % (ref 39.0–52.0)
Hemoglobin: 13 g/dL (ref 13.0–17.0)
MCH: 28 pg (ref 26.0–34.0)
MCHC: 31.6 g/dL (ref 30.0–36.0)
MCV: 88.8 fL (ref 78.0–100.0)
PLATELETS: 170 10*3/uL (ref 150–400)
RBC: 4.64 MIL/uL (ref 4.22–5.81)
RDW: 14.4 % (ref 11.5–15.5)
WBC: 12 10*3/uL — ABNORMAL HIGH (ref 4.0–10.5)

## 2016-11-14 LAB — CBG MONITORING, ED: GLUCOSE-CAPILLARY: 75 mg/dL (ref 65–99)

## 2016-11-14 LAB — BASIC METABOLIC PANEL
Anion gap: 11 (ref 5–15)
BUN: 21 mg/dL — AB (ref 6–20)
CALCIUM: 8.6 mg/dL — AB (ref 8.9–10.3)
CHLORIDE: 104 mmol/L (ref 101–111)
CO2: 24 mmol/L (ref 22–32)
CREATININE: 1.11 mg/dL (ref 0.61–1.24)
GFR calc non Af Amer: >60 mL/min (ref >60)
GLUCOSE: 84 mg/dL (ref 65–99)
Potassium: 3.8 mmol/L (ref 3.5–5.1)
Sodium: 139 mmol/L (ref 135–145)

## 2016-11-14 MED ORDER — LOSARTAN POTASSIUM 25 MG PO TABS
25.0000 mg | ORAL_TABLET | Freq: Every day | ORAL | Status: DC
  Filled 2016-11-14: qty 1

## 2016-11-14 MED ORDER — AMIODARONE HCL 200 MG PO TABS
200.0000 mg | ORAL_TABLET | Freq: Every day | ORAL | Status: DC
  Administered 2016-11-14: 200 mg via ORAL
  Filled 2016-11-14: qty 1

## 2016-11-14 MED ORDER — APIXABAN 5 MG PO TABS
5.0000 mg | ORAL_TABLET | Freq: Two times a day (BID) | ORAL | Status: DC
  Filled 2016-11-14: qty 1

## 2016-11-14 MED ORDER — FUROSEMIDE 20 MG PO TABS
20.0000 mg | ORAL_TABLET | Freq: Every day | ORAL | Status: DC
  Administered 2016-11-14: 20 mg via ORAL
  Filled 2016-11-14: qty 1

## 2016-11-14 MED ORDER — MIRABEGRON ER 25 MG PO TB24
25.0000 mg | ORAL_TABLET | Freq: Every day | ORAL | Status: DC
  Filled 2016-11-14: qty 1

## 2016-11-14 MED ORDER — AMOXICILLIN-POT CLAVULANATE 875-125 MG PO TABS
1.0000 | ORAL_TABLET | Freq: Two times a day (BID) | ORAL | Status: DC
  Administered 2016-11-14: 1 via ORAL
  Filled 2016-11-14: qty 1

## 2016-11-14 MED ORDER — METOPROLOL SUCCINATE ER 50 MG PO TB24
50.0000 mg | ORAL_TABLET | Freq: Every day | ORAL | Status: DC
  Filled 2016-11-14: qty 1

## 2016-11-14 MED ORDER — AMIODARONE HCL 200 MG PO TABS
400.0000 mg | ORAL_TABLET | Freq: Two times a day (BID) | ORAL | Status: DC
  Filled 2016-11-14: qty 2

## 2016-11-14 MED ORDER — BUPROPION HCL ER (XL) 150 MG PO TB24
300.0000 mg | ORAL_TABLET | Freq: Every day | ORAL | Status: DC
  Administered 2016-11-14: 300 mg via ORAL
  Filled 2016-11-14: qty 2

## 2016-11-14 MED ORDER — ROSUVASTATIN CALCIUM 5 MG PO TABS: 5.0000 mg | ORAL_TABLET | Freq: Every day | ORAL | Status: DC

## 2016-11-14 MED ORDER — AMOXICILLIN-POT CLAVULANATE 875-125 MG PO TABS: 1.0000 | ORAL_TABLET | Freq: Two times a day (BID) | ORAL | 0 refills | Status: DC

## 2016-11-14 NOTE — ED Triage Notes (Signed)
Pt arrives from home via [facility] reporting generalized weakness with a "slide" from the side of the bed onto the floor this morning, 0600.  Pt reports recent stay at [facility], clean card cath on Monday, states need for rehab after 6 day stay.  Pt denies head or back trauma, CP, dizziness, SOB.  Aox4, resp e/u.

## 2016-11-14 NOTE — Progress Notes (Signed)
Pt agreeable to go to [facility] where pt has agreed tor pay privately at the facility. CSW has spoken with [employer] Rep, RN, and doctor. At this time CSW is faxing over clinicals to facility. CSW has scheduled PTAR for 2pm and RN is to call report to [phone]. CSW signing off.    Virgie Dad Geanine Vandekamp, MSW, [place] [facility] Clinical Social Worker [phone]

## 2016-11-14 NOTE — ED Notes (Signed)
ED Provider at bedside. 

## 2016-11-14 NOTE — Clinical Social Work Note (Signed)
Clinical Social Work Assessment  Patient Details  Name: Justin Tate MRN: [id number] Date of Birth: [birth year]  Date of referral:  11/14/16               Reason for consult:  Facility Placement                Permission sought to share information with:  Case Manager Permission granted to share information::  Yes, Verbal Permission Granted  Name::     Justin Tate   Agency::     Relationship::     Contact Information:     Housing/Transportation Living arrangements for the past 2 months:  Apartment Source of Information:  Patient Patient Interpreter Needed:  None Criminal Activity/Legal Involvement Pertinent to Current Situation/Hospitalization:  No - Comment as needed Significant Relationships:  None Lives with:  Self Do you feel safe going back to the place where you live?  Yes Need for family participation in patient care:  Yes (Comment)  Care giving concerns:  CSW spoke with pt at bedside. Pt expressed concerns about caring for self at this time as pt reports that pt is unable to walk.    Social Worker assessment / plan:  CSW spoke with pt at bedside. Pt expressed that pt was just discharged from [facility] yesterday and denied wanting SNF placement despite begin told PT that pt really needed it. Pt reports that once home pt realized that pt is unable to care for self and has no other supports to help pt. Pt expresses now being agreeable to SNF.   Employment status:  Retired Nurse, adult PT Recommendations:  [place] / Referral to community resources:  [place]  Patient/Family's Response to care:  Pt is understanding and agreeable to plan of care at this time.   Patient/Family's Understanding of and Emotional Response to Diagnosis, Current Treatment, and Prognosis:  No further questions or concerns have been presented to CSW at this time.   Emotional Assessment Appearance:  Appears stated  age Attitude/Demeanor/Rapport:    Affect (typically observed):  Pleasant Orientation:  Oriented to Self, Oriented to Place, Oriented to  Time, Oriented to Situation Alcohol / Substance use:  Not Applicable Psych involvement (Current and /or in the community):  No (Comment)  Discharge Needs  Concerns to be addressed:  Care Coordination Readmission within the last 30 days:  Yes Current discharge risk:  Lives alone Barriers to Discharge:  No Barriers Identified   Justin Tate, [place] 11/14/2016, 9:16 AM

## 2016-11-14 NOTE — Progress Notes (Signed)
Pt spoke with pt at bedside and was informed that pt wants [facility] at this time. Spoke with Abigail Butts from the facility and was informed that pt's insurance is out of network with this facility but they can take pt if pt is willing to pay out of pocket for the stay. CSW attempted to reach back out to Abigail Butts to inform her that opt is wanting [facility] but CSW has not heard back at this time. Pt is agreeable to [facility] if CSW does not hear from [place].       Virgie Dad Omarius Grantham, MSW, [place] [facility] Clinical Social Worker [phone]

## 2016-11-14 NOTE — Telephone Encounter (Signed)
Patient insurance is active and benefits verified. Patient insurance is [employer] - $45.00 co-pay, no deductible, out of pocket $4500/$1200.52, no co-insurance and no pre-authorization. Passport/reference # [id number].  Patient will be contacted and scheduled after their follow up appointment with the cardiologist office on 12/05/16, upon review by [facility] RN navigator.

## 2016-11-14 NOTE — NC FL2 (Signed)
[place] LEVEL OF CARE SCREENING TOOL     IDENTIFICATION  Patient Name: Justin Tate Birthdate: 01/01/1946 Sex: male Admission Date (Current Location): 11/14/2016  [facility] and [place] Number:  Herbalist and Address:  [facility], [place] [address], [place], [place] [address]      Provider Number: [id number]  Attending Physician Name and Address:  Quintella Reichert, MD  Relative Name and Phone Number:       Current Level of Care: Hospital Recommended Level of Care: [place] Prior Approval Number:    Date Approved/Denied:   PASRR Number:   [id number]   Discharge Plan: SNF    Current Diagnoses: Patient Active Problem List   Diagnosis Date Noted  . Tachyarrhythmia 11/09/2016  . NSTEMI (non-ST elevated myocardial infarction) ([place]) 11/09/2016  . Chronic diastolic CHF (congestive heart failure), NYHA class 3 ([place]) 11/09/2016  . AKI (acute kidney injury) ([place]) 11/09/2016  . Status post arthroscopy of right knee 07/11/2016  . Acute meniscal tear of right knee 07/05/2016  . Pain of left lower extremity 08/02/2014  . VV (varicose veins) 08/02/2014  . Varicose veins of lower extremities with other complications 82/50/5397  . Swelling of limb 11/13/2012  . Essential hypertension 11/04/2012  . Impaired fasting glucose 11/04/2012  . Mixed dyslipidemia 11/04/2012  . Morbid obesity with BMI of 50.0-59.9, adult ([place]) 11/04/2012    Orientation RESPIRATION BLADDER Height & Weight     Self, Time, Situation, Place  Normal Continent Weight:   Height:     BEHAVIORAL SYMPTOMS/MOOD NEUROLOGICAL BOWEL NUTRITION STATUS      Continent  (please see AVS of discharge summary. )  AMBULATORY STATUS COMMUNICATION OF NEEDS Skin   Extensive Assist Verbally Normal                       Personal Care Assistance Level of Assistance  Bathing, Feeding, Dressing Bathing Assistance: Maximum assistance Feeding assistance: Limited  assistance Dressing Assistance: Maximum assistance     Functional Limitations Info  Sight, Hearing, Speech Sight Info: Adequate Hearing Info: Adequate Speech Info: Adequate    SPECIAL CARE FACTORS FREQUENCY  PT (By licensed PT), OT (By licensed OT)     PT Frequency: 5 times a week.  OT Frequency: 5 times a week.             Contractures Contractures Info: Not present    Additional Factors Info  Code Status, Allergies Code Status Info: Prior  Allergies Info: NKA           Current Medications (11/14/2016):  This is the current hospital active medication list No current facility-administered medications for this encounter.    Current Outpatient Prescriptions  Medication Sig Dispense Refill  . acetaminophen (TYLENOL) 650 MG CR tablet Take 1,300 mg by mouth every 8 (eight) hours as needed for pain.    Marland Kitchen amiodarone (PACERONE) 200 MG tablet Take 1 tablet (200 mg total) by mouth daily. 30 tablet 5  . amiodarone (PACERONE) 400 MG tablet Take 1 tablet (400 mg total) by mouth 2 (two) times daily. 7 tablet 0  . amoxicillin-clavulanate (AUGMENTIN) 875-125 MG tablet Take 1 tablet by mouth 2 (two) times daily as needed (takes for infection in ankle as needed).    Marland Kitchen apixaban (ELIQUIS) 5 MG TABS tablet Take 1 tablet (5 mg total) by mouth 2 (two) times daily. 60 tablet 0  . aspirin EC 81 MG tablet Take 81 mg by mouth  daily.    . buPROPion (WELLBUTRIN XL) 300 MG 24 hr tablet Take 300 mg by mouth daily.    . Cholecalciferol (VITAMIN D3) 2000 UNITS TABS Take 1 tablet by mouth daily.     . ferrous sulfate 325 (65 FE) MG tablet Take 325 mg by mouth daily with breakfast.    . furosemide (LASIX) 20 MG tablet Take 1 tablet (20 mg total) by mouth daily. 90 tablet 2  . losartan (COZAAR) 25 MG tablet Take 1 tablet (25 mg total) by mouth daily. 90 tablet 2  . meloxicam (MOBIC) 15 MG tablet Take 15 mg by mouth daily. with food  1  . metoprolol succinate (TOPROL-XL) 50 MG 24 hr tablet Take 1 tablet  (50 mg total) by mouth daily. Take with or immediately following a meal. 90 tablet 2  . Multiple Vitamin (MULTIVITAMIN) tablet Take 1 tablet by mouth daily.    Marland Kitchen MYRBETRIQ 25 MG TB24 tablet Take 25 mg by mouth daily.  3  . rosuvastatin (CRESTOR) 5 MG tablet Take 1 tablet (5 mg total) by mouth at bedtime. 30 tablet 11     Discharge Medications: Please see discharge summary for a list of discharge medications.  Relevant Imaging Results:  Relevant Lab Results:   Additional Information [id number]  Wetzel Bjornstad, LCSWA

## 2016-11-14 NOTE — ED Provider Notes (Signed)
[facility] EMERGENCY DEPARTMENT Provider Note   CSN: [id number] Arrival date & time: 11/14/16  6644     History   Chief Complaint Chief Complaint  Patient presents with  . Weakness    HPI Justin Tate is a 71 y.o. male.  The history is provided by the patient. No language interpreter was used.  Weakness     Justin Tate is a 71 y.o. male who presents to the Emergency Department complaining of weakness.  He was recently discharged from the hospital following admission for tachycardia.  He had a 6-day hospitalization and states that he had very limited mobility until the last day in the hospital.  When he got home he had considerable weakness and difficulty with ambulation.  He was able to get himself to his lift chair and fell asleep there.  At some point during the night he did make it to his bed.  When he was trying to get out of his bed he was having difficulty with strength and balance and fell to the floor.  He denies any head injury, loss of consciousness or pain related to the fall.  He states that he feels weak in general and thinks he needs rehabilitation.  He denies any fevers, chest pain, abdominal pain, vomiting, diarrhea, hematochezia, melena.  Symptoms are mild to moderate and constant in nature.  Past Medical History:  Diagnosis Date  . Acute on chronic diastolic CHF (congestive heart failure), NYHA class 4 ([place]) 11/09/2016  . AKI (acute kidney injury) ([place]) 11/09/2016  . Anemia   . Arthritis   . Cataracts, bilateral   . Chronic diastolic CHF (congestive heart failure), NYHA class 3 ([place]) 11/09/2016  . Colon polyps   . Depression   . Dyspnea   . Hyperlipidemia   . Hypertension   . NSTEMI (non-ST elevated myocardial infarction) ([place]) 11/09/2016  . Pneumonia    hx  . Sleep apnea    dx 25 yrs ago used cpap 12 yrs or so-  not used 6-7 yrs  . Varicose veins     Patient Active Problem List   Diagnosis Date Noted  . Tachyarrhythmia 11/09/2016   . NSTEMI (non-ST elevated myocardial infarction) ([place]) 11/09/2016  . Chronic diastolic CHF (congestive heart failure), NYHA class 3 ([place]) 11/09/2016  . AKI (acute kidney injury) ([place]) 11/09/2016  . Status post arthroscopy of right knee 07/11/2016  . Acute meniscal tear of right knee 07/05/2016  . Pain of left lower extremity 08/02/2014  . VV (varicose veins) 08/02/2014  . Varicose veins of lower extremities with other complications 03/47/4259  . Swelling of limb 11/13/2012  . Essential hypertension 11/04/2012  . Impaired fasting glucose 11/04/2012  . Mixed dyslipidemia 11/04/2012  . Morbid obesity with BMI of 50.0-59.9, adult ([place]) 11/04/2012    Past Surgical History:  Procedure Laterality Date  . ENDOSCOPIC VEIN LASER TREATMENT    . KNEE ARTHROSCOPY Right 07/11/2016  . KNEE ARTHROSCOPY Right 07/11/2016   Procedure: ARTHROSCOPY KNEE;  Surgeon: Frederik Pear, MD;  Location: [facility];  Service: Orthopedics;  Laterality: Right;  . LEFT HEART CATH AND CORONARY ANGIOGRAPHY N/A 11/12/2016   Procedure: LEFT HEART CATH AND CORONARY ANGIOGRAPHY;  Surgeon: Lorretta Harp, MD;  Location: [facility];  Service: Cardiovascular;  Laterality: N/A;  . MENISCUS DEBRIDEMENT Right 07/11/2016   Procedure: DEBRIDEMENT OF Chondromalacia;  Surgeon: Frederik Pear, MD;  Location: [facility];  Service: Orthopedics;  Laterality: Right;  . MENISECTOMY Right 07/11/2016   Procedure: PARTIAL  MEDIAL MENISECTOMY;  Surgeon: Frederik Pear, MD;  Location: [facility];  Service: Orthopedics;  Laterality: Right;  . TONSILLECTOMY         Home Medications    Prior to Admission medications   Medication Sig Start Date End Date Taking? Authorizing Provider  acetaminophen (TYLENOL) 650 MG CR tablet Take 1,300 mg by mouth every 8 (eight) hours as needed for pain.   Yes [provider]  aspirin EC 81 MG tablet Take 81 mg by mouth daily.   Yes [provider]  buPROPion (WELLBUTRIN XL) 300 MG 24 hr tablet Take 300  mg by mouth daily.   Yes [provider]  Cholecalciferol (VITAMIN D3) 2000 UNITS TABS Take 2,000 Units by mouth daily.    Yes [provider]  ferrous sulfate 325 (65 FE) MG tablet Take 325 mg by mouth daily with breakfast.   Yes [provider]  meloxicam (MOBIC) 15 MG tablet Take 15 mg by mouth daily. with food 10/03/16  Yes [provider]  Multiple Vitamin (MULTIVITAMIN) tablet Take 1 tablet by mouth daily.   Yes [provider]  MYRBETRIQ 25 MG TB24 tablet Take 25 mg by mouth daily. 10/16/16  Yes [provider]  amiodarone (PACERONE) 200 MG tablet Take 1 tablet (200 mg total) by mouth daily. Patient not taking: Reported on 11/14/2016 11/13/16   Daune Perch, NP  amiodarone (PACERONE) 400 MG tablet Take 1 tablet (400 mg total) by mouth 2 (two) times daily. Patient not taking: Reported on 11/14/2016 11/13/16   Daune Perch, NP  amoxicillin-clavulanate (AUGMENTIN) 875-125 MG tablet Take 1 tablet by mouth every 12 (twelve) hours. 11/14/16   Quintella Reichert, MD  apixaban (ELIQUIS) 5 MG TABS tablet Take 1 tablet (5 mg total) by mouth 2 (two) times daily. Patient not taking: Reported on 11/14/2016 11/13/16   Daune Perch, NP  furosemide (LASIX) 20 MG tablet Take 1 tablet (20 mg total) by mouth daily. Patient not taking: Reported on 11/14/2016 11/13/16   Daune Perch, NP  losartan (COZAAR) 25 MG tablet Take 1 tablet (25 mg total) by mouth daily. Patient not taking: Reported on 11/14/2016 11/14/16   Daune Perch, NP  metoprolol succinate (TOPROL-XL) 50 MG 24 hr tablet Take 1 tablet (50 mg total) by mouth daily. Take with or immediately following a meal. Patient not taking: Reported on 11/14/2016 11/14/16   Daune Perch, NP  rosuvastatin (CRESTOR) 5 MG tablet Take 1 tablet (5 mg total) by mouth at bedtime. Patient not taking: Reported on 11/14/2016 11/13/16 11/13/17  Daune Perch, NP    Family History Family History    Problem Relation Age of Onset  . Cancer Mother        colon  . Other Mother        varicose veins  . Heart disease Father   . Heart attack Father 29  . Nephrolithiasis Father   . Diabetes Sister        type 1    Social History Social History  Substance Use Topics  . Smoking status: Never Smoker  . Smokeless tobacco: Never Used  . Alcohol use No     Allergies   No known allergies   Review of Systems Review of Systems  Neurological: Positive for weakness.  All other systems reviewed and are negative.    Physical Exam Updated Vital Signs BP (!) 137/50   Pulse (!) 31   Temp 99 F (37.2 C) (Oral)   Resp (!) 23   SpO2  91%   Physical Exam  Constitutional: He is oriented to person, place, and time. He appears well-developed and well-nourished.  HENT:  Head: Normocephalic and atraumatic.  Cardiovascular: Normal rate and regular rhythm.   No murmur heard. Pulmonary/Chest: Effort normal and breath sounds normal. No respiratory distress.  Abdominal: Soft. There is no tenderness. There is no rebound and no guarding.  Musculoskeletal:  Nonpitting edema to bilateral lower extremities.  Healing ecchymoses to bilateral upper extremities. Mild erythema to bilateral lower extremities, right greater than left  Neurological: He is alert and oriented to person, place, and time.  Generalized weakness  Skin: Skin is warm and dry.  Psychiatric: He has a normal mood and affect. His behavior is normal.  Nursing note and vitals reviewed.    ED Treatments / Results  Labs (all labs ordered are listed, but only abnormal results are displayed) Labs Reviewed  BASIC METABOLIC PANEL - Abnormal; Notable for the following:       Result Value   BUN 21 (*)    Calcium 8.6 (*)    All other components within normal limits  CBC - Abnormal; Notable for the following:    WBC 12.0 (*)    All other components within normal limits  URINALYSIS, ROUTINE W REFLEX MICROSCOPIC - Abnormal; Notable  for the following:    Ketones, ur 5 (*)    All other components within normal limits  CBG MONITORING, ED    EKG  EKG Interpretation  Date/Time:  Wednesday November 14 2016 86:76:19 EDT Ventricular Rate:  78 PR Interval:    QRS Duration: 146 QT Interval:  428 QTC Calculation: 488 R Axis:   -67 Text Interpretation:  Sinus rhythm Ventricular trigeminy Prolonged PR interval Left bundle branch block Confirmed by Quintella Reichert [phone]) on 11/14/2016 7:58:57 AM       Radiology Dg Chest 2 View  Result Date: 11/14/2016 CLINICAL DATA:  Weakness, fall, shortness of breath EXAM: CHEST  2 VIEW COMPARISON:  11/09/2016 FINDINGS: Cardiomegaly evident with central vascular congestion and lower lung volumes. Associated bibasilar atelectasis. No definite focal pneumonia, significant collapse or consolidation. Negative for edema, large effusion or pneumothorax. Right costophrenic angle is excluded on the study. Trachea is midline. Degenerative changes of the spine and associated scoliosis. IMPRESSION: Cardiomegaly with vascular congestion and basilar atelectasis. Lower lung volumes. Electronically Signed   By: Jerilynn Mages.  Shick M.D.   On: 11/14/2016 08:36    Procedures Procedures (including critical care time)  Medications Ordered in ED Medications  amiodarone (PACERONE) tablet 400 mg (400 mg Oral Refused 11/14/16 1204)  losartan (COZAAR) tablet 25 mg (not administered)  furosemide (LASIX) tablet 20 mg (20 mg Oral Given 11/14/16 1153)  amiodarone (PACERONE) tablet 200 mg (200 mg Oral Given 11/14/16 1154)  metoprolol succinate (TOPROL-XL) 24 hr tablet 50 mg (not administered)  apixaban (ELIQUIS) tablet 5 mg (not administered)  rosuvastatin (CRESTOR) tablet 5 mg (not administered)  buPROPion (WELLBUTRIN XL) 24 hr tablet 300 mg (300 mg Oral Given 11/14/16 1151)  mirabegron ER (MYRBETRIQ) tablet 25 mg (not administered)  amoxicillin-clavulanate (AUGMENTIN) 875-125 MG per tablet 1 tablet (1 tablet Oral  Given 11/14/16 1158)     Initial Impression / Assessment and Plan / ED Course  I have reviewed the triage vital signs and the nursing notes.  Pertinent labs & imaging results that were available during my care of the patient were reviewed by me and considered in my medical decision making (see chart for details).     Patient in  the emergency department with generalized weakness and a fall at home after recent hospitalization for atrial tachycardia.  He is generally weak on examination with no evidence of injuries from the fall.  He has erythema to bilateral lower extremities right slightly greater than left.  He states that this is chronic but he does get flareups of cellulitis and is treated with Augmentin, will treat for 1 week.  He currently agrees to SNF placement as was recommended at time of hospital discharge yesterday due to his inability to stand or ambulate safely on his own.  He will be transferred to skilled nursing facility with outpatient follow-up and return precautions.  Please notes that he did not have a heart rate of 31 or 143 and these were documented in error. Final Clinical Impressions(s) / ED Diagnoses   Final diagnoses:  Generalized weakness  Cellulitis of right lower extremity    New Prescriptions Discharge Medication List as of 11/14/2016  2:15 PM       Quintella Reichert, MD 11/14/16 1507

## 2016-11-14 NOTE — Progress Notes (Signed)
A representative is coming from [facility] to meet with pt at bedside. CSW explained insurance barriers again to pt and asked if pt would be able to pay out of pocket if insurances not willing to pay. Pt is agreeable to paying out of pocket and got an estimate from [facility] representative that at this time it is $263 per day for pt to be at the facility. CSW to follow up with pt and doctor after pt speaks with [facility] Rep.    Justin Tate, MSW, [place] [facility] Clinical Social Worker [phone]

## 2016-11-14 NOTE — Progress Notes (Signed)
CSW spoke with doctor. At this time pt is not being admitted but is needing SNF placement. CSW explained to both doctor and pt the barriers that may be associated with placement for pt being that pt has [employer] (may take a little while for authorization). CSW has faxed pt out and is awaiting to see who can meet pt needs at this time. CSW continues to follow for discharge.    Virgie Dad Aeron [place], MSW, [place] [facility] Department Clinical Social Worker [phone]

## 2016-11-15 ENCOUNTER — Encounter: Payer: Self-pay | Admitting: Adult Health

## 2016-11-15 ENCOUNTER — Non-Acute Institutional Stay (SKILLED_NURSING_FACILITY): Payer: Medicare HMO | Admitting: Adult Health

## 2016-11-15 DIAGNOSIS — I5032 Chronic diastolic (congestive) heart failure: Secondary | ICD-10-CM

## 2016-11-15 DIAGNOSIS — I214 Non-ST elevation (NSTEMI) myocardial infarction: Secondary | ICD-10-CM

## 2016-11-15 DIAGNOSIS — M170 Bilateral primary osteoarthritis of knee: Secondary | ICD-10-CM | POA: Insufficient documentation

## 2016-11-15 DIAGNOSIS — R05 Cough: Secondary | ICD-10-CM

## 2016-11-15 DIAGNOSIS — R058 Other specified cough: Secondary | ICD-10-CM

## 2016-11-15 DIAGNOSIS — N393 Stress incontinence (female) (male): Secondary | ICD-10-CM | POA: Diagnosis not present

## 2016-11-15 DIAGNOSIS — G4733 Obstructive sleep apnea (adult) (pediatric): Secondary | ICD-10-CM | POA: Diagnosis not present

## 2016-11-15 DIAGNOSIS — Z6841 Body Mass Index (BMI) 40.0 and over, adult: Secondary | ICD-10-CM

## 2016-11-15 DIAGNOSIS — I10 Essential (primary) hypertension: Secondary | ICD-10-CM

## 2016-11-15 DIAGNOSIS — E782 Mixed hyperlipidemia: Secondary | ICD-10-CM

## 2016-11-15 DIAGNOSIS — I4891 Unspecified atrial fibrillation: Secondary | ICD-10-CM

## 2016-11-15 DIAGNOSIS — D638 Anemia in other chronic diseases classified elsewhere: Secondary | ICD-10-CM | POA: Diagnosis not present

## 2016-11-15 NOTE — Progress Notes (Signed)
Location:   [place] Room Number: 132 A Place of Service:  SNF (31)   CODE STATUS: Full code  Allergies  Allergen Reactions  . No Known Allergies     Chief Complaint  Patient presents with  . Hospitalization Follow-up    hospital follow up    HPI:  He is a 71 year old who was hospitalized from 11-09-16 through 11-13-16 for NSTEMI afib with rvr. He did have a cardiac cath done on 11-11-16; cariology felt that elevated troponin was due to demand ischemia for afib with rvr.  He has acute on chronic combined heart failure and his medications were adjusted. He then returned back home. He returned to the ED on 11-14-16 for weakness. He is here for short term rehab with his goal to return back home.   Past Medical History:  Diagnosis Date  . Acute on chronic diastolic CHF (congestive heart failure), NYHA class 4 ([place]) 11/09/2016  . AKI (acute kidney injury) ([place]) 11/09/2016  . Anemia   . Arthritis   . Cataracts, bilateral   . Chronic diastolic CHF (congestive heart failure), NYHA class 3 ([place]) 11/09/2016  . Colon polyps   . Depression   . Dyspnea   . Hyperlipidemia   . Hypertension   . NSTEMI (non-ST elevated myocardial infarction) ([place]) 11/09/2016  . Pneumonia    hx  . Sleep apnea    dx 25 yrs ago used cpap 12 yrs or so-  not used 6-7 yrs  . Varicose veins     Past Surgical History:  Procedure Laterality Date  . ENDOSCOPIC VEIN LASER TREATMENT    . KNEE ARTHROSCOPY Right 07/11/2016  . KNEE ARTHROSCOPY Right 07/11/2016   Procedure: ARTHROSCOPY KNEE;  Surgeon: Frederik Pear, MD;  Location: [place];  Service: Orthopedics;  Laterality: Right;  . LEFT HEART CATH AND CORONARY ANGIOGRAPHY N/A 11/12/2016   Procedure: LEFT HEART CATH AND CORONARY ANGIOGRAPHY;  Surgeon: Lorretta Harp, MD;  Location: [place] CV LAB;  Service: Cardiovascular;  Laterality: N/A;  . MENISCUS DEBRIDEMENT Right 07/11/2016   Procedure: DEBRIDEMENT OF Chondromalacia;  Surgeon: Frederik Pear, MD;  Location: [place];  Service: Orthopedics;  Laterality: Right;  . MENISECTOMY Right 07/11/2016   Procedure: PARTIAL MEDIAL MENISECTOMY;  Surgeon: Frederik Pear, MD;  Location: [place];  Service: Orthopedics;  Laterality: Right;  . TONSILLECTOMY      Social History   Social History  . Marital status: Single    Spouse name: N/A  . Number of children: N/A  . Years of education: N/A   Occupational History  . Retired    Social History Main Topics  . Smoking status: Never Smoker  . Smokeless tobacco: Never Used  . Alcohol use No  . Drug use: No  . Sexual activity: Not on file   Other Topics Concern  . Not on file   Social History Narrative   Pt lives alone in [place]. Does not have stairs.   Family History  Problem Relation Age of Onset  . Cancer Mother        colon  . Other Mother        varicose veins  . Heart disease Father   . Heart attack Father 38  . Nephrolithiasis Father   . Diabetes Sister        type 1      VITAL SIGNS BP 114/82   Pulse (!) 52   Resp 20   Ht 5\' 9"  (1.753 m)  Wt (!) 363 lb 14.4 oz (165.1 kg)   SpO2 95%   BMI 53.74 kg/m   Patient's Medications  New Prescriptions   No medications on file  Previous Medications   ACETAMINOPHEN (TYLENOL) 325 MG TABLET    Take 650 mg by mouth every 6 (six) hours as needed.   AMIODARONE (PACERONE) 200 MG TABLET    Take 1 tablet (200 mg total) by mouth daily.   AMIODARONE (PACERONE) 400 MG TABLET    Take 1 tablet (400 mg total) by mouth 2 (two) times daily.   AMOXICILLIN-CLAVULANATE (AUGMENTIN) 875-125 MG TABLET    Take 1 tablet by mouth every 12 (twelve) hours.   APIXABAN (ELIQUIS) 5 MG TABS TABLET    Take 1 tablet (5 mg total) by mouth 2 (two) times daily.   ASPIRIN EC 81 MG TABLET    Take 81 mg by mouth daily.   BUPROPION (WELLBUTRIN XL) 300 MG 24 HR TABLET    Take 300 mg by mouth daily.   CHOLECALCIFEROL (VITAMIN D3) 2000 UNITS TABS    Take 2,000 Units by mouth daily.    FERROUS SULFATE 325  (65 FE) MG TABLET    Take 325 mg by mouth daily with breakfast.   FUROSEMIDE (LASIX) 20 MG TABLET    Take 1 tablet (20 mg total) by mouth daily.   LOSARTAN (COZAAR) 25 MG TABLET    Take 1 tablet (25 mg total) by mouth daily.   MELOXICAM (MOBIC) 15 MG TABLET    Take 15 mg by mouth daily. with food   METOPROLOL SUCCINATE (TOPROL-XL) 50 MG 24 HR TABLET    Take 1 tablet (50 mg total) by mouth daily. Take with or immediately following a meal.   MIRABEGRON ER (MYRBETRIQ) 25 MG TB24 TABLET    Take 25 mg by mouth daily.   MULTIPLE VITAMIN (MULTIVITAMIN) TABLET    Take 1 tablet by mouth daily.   ROSUVASTATIN (CRESTOR) 5 MG TABLET    Take 1 tablet (5 mg total) by mouth at bedtime.  Modified Medications   No medications on file  Discontinued Medications   ACETAMINOPHEN (TYLENOL) 650 MG CR TABLET    Take 1,300 mg by mouth every 8 (eight) hours as needed for pain.   MYRBETRIQ 25 MG TB24 TABLET    Take 25 mg by mouth daily.     SIGNIFICANT DIAGNOSTIC EXAMS  TODAY:   11-09-16: chest x-ray: No active disease.  11-11-16: 2-d echo:  - Left ventricle: The cavity size was normal. There was moderate concentric hypertrophy. Systolic function was mildly reduced. The estimated ejection fraction was in the range of 45% to 50%.  Diffuse hypokinesis. Features are consistent with a pseudonormal  left ventricular filling pattern, with concomitant abnormal relaxation and increased filling pressure (grade 2 diastolic dysfunction). - Aortic valve: Transvalvular velocity was within the normal range. There was no stenosis. There was no regurgitation. - Mitral valve: Transvalvular velocity was within the normal range. There was no evidence for stenosis. There was trivial  regurgitation. - Left atrium: The atrium was severely dilated. - Right ventricle: The cavity size was normal. Wall thickness was normal. Systolic function was normal. - Atrial septum: No defect or patent foramen ovale was identified. - Tricuspid valve:  There was mild regurgitation. - Pulmonary arteries: Systolic pressure was severely increased. PA peak pressure: 62 mm Hg (S).  11-12-16: cardiac cath: IMPRESSION:Mr. Ratterman has normal coronary arteries and mildly elevated LVEDP. I believe that his troponin elevation was related to demand  ischemia from A. Fib with RVR. Continue medical therapy will be recommended.The sheath was removed and pressure will be held on the right groin to achieve hemostasis. The patient left the lab in stable condition. Because of his size and going to put him on Unit 6C for  closer groin observation.   11-14-16: chest x-ray: Cardiomegaly with vascular congestion and basilar atelectasis. Lower lung volumes.  LABS REVIEWED: TODAY:   11-09-16: wbc 13.2; hgb 14.9; hct 45.6; mcv 89.6; plt 233; glucose 167; bun 24; creat 2.11; k+ 4.0; na++ 140; ca 8.8; BNP 656.2 hgb a1c: 6.3 11-10-16: wbc 10.1; hgb 12.8; hct 41.2; mcv 90.0; plt 145; glucose  95; bun 27; creat 1.69; k+ 3.6; na++ 138; ca 8.4 11-11-16: wbc 11.5; hgb 13.4; hct 42.0;mcv 90.1 plt 147 11-13-16: wbc 10.4;hgb 12.7; hct 40.0; mcv 89.5; plt 147; glucose 149; bun 19; creat 1.15; k+ 3.7; na++ 138; ca 8.7; chol 1442; ldl 77; trig 79; hdl 49  11-14-16: wbc 12.0; hgb 13.0; hct 41.2; mcv 88.8; plt 170; glucose 84; bun 21; creat 1.11; k+ 3.8; na++ 139; ca 8.6   Review of Systems  Constitutional: Negative for malaise/fatigue.  Respiratory: Positive for cough and sputum production. Negative for shortness of breath.        Clear sputum Does need cpap machine   Cardiovascular: Positive for leg swelling. Negative for chest pain and palpitations.       Is mild   Gastrointestinal: Positive for constipation. Negative for abdominal pain and heartburn.  Genitourinary:       Has stress incontinence   Musculoskeletal: Negative for back pain, joint pain and myalgias.  Skin: Negative.   Neurological: Negative for dizziness.  Psychiatric/Behavioral: Negative for depression. The  patient is not nervous/anxious.    Physical Exam  Constitutional: He is oriented to person, place, and time. He appears well-developed and well-nourished. No distress.  Morbid obesity   Eyes: Conjunctivae are normal.  Neck: Neck supple. No thyromegaly present.  Cardiovascular: Normal rate, normal heart sounds and intact distal pulses.   Heart rate irregular irregular   Pulmonary/Chest: Effort normal. No respiratory distress.  Breath sounds diminished   Abdominal: Soft. Bowel sounds are normal. He exhibits no distension. There is no tenderness.  Musculoskeletal: He exhibits edema.  Has trace lower extremity edema Is able to move all extremities Has lower extremity weakness especially in knees   Neurological: He is alert and oriented to person, place, and time. No cranial nerve deficit.  Skin: Skin is warm and dry. He is not diaphoretic.  Lower legs discolored   Psychiatric: He has a normal mood and affect.      ASSESSMENT/ PLAN:  TODAY:   1. afib with rvr: heart rate is stable; will continue amiodarone 200 mg daily; asa 81 mg daily toprol xl 50 mg daily for rate control.   2. Acute on chronic diastolic heart failure NYHA class 3:  (EF 45-50%): is stable will continue lasix 20 mg daily takes toprol xl 50 mg daily will monitor  Will begin daily weights   3. Hypertension: b/p 114/82: is stable will continue cozaar 25 mg daily and toprol xl 50 mg daily asa 81 mg daily   4. NSTEMI:  Not sure if he truly has had this: cardiology felt as though he had stress related elevated troponin due o his afib. Will monitor  5. Depression with anxiety: is stable will continue wellbutrin xl 300 mg daily  6.  Anemia: stable hgb 13.0; will continue iron daily  7. Stress UI: is stable will continue myrbetriq 25 mg daily   8. Dyslipidemia: stable LDL 77; will continue crestor 5 mg daily   9. Arthritis: is without change: is status post right knee meniscal tear arthroscopy: will continue mobic 15  mg daily   10. Morbid obesity: BMI 53.74 with weight of 363 pounds: without change: will continue to encourage him to follow a heart healthy diet and to have better control of caloric intake.   11. Constipation; is worse: will begin colace daily as needed; he states he can change from constipation to diarrhea easily.   12. OSA: stable he will need a CPAP machine. He tells me that this was arranged by [facility] home [facility] prior to his hospitalized; but has not had time to get it delivered. I have contacted Reche Dixon who will provide the settings so he can use this here. In the mean time will use 02 at 2L/[place] at hs and while sleeping to help maintain 02 saturation.   13. Cough with congestion: will begin mucinex 600 mg twice daily for 10 days and will monitor   Will stop augmentin; I cannot find a reason for this medication  MD is aware of resident's narcotic use and is in agreement with current plan of care. We will attempt to wean resident as apropriate   Ok Edwards NP [facility]  Contact [phone] Monday through Friday 8am- 5pm  After hours call [phone]

## 2016-11-16 ENCOUNTER — Non-Acute Institutional Stay (SKILLED_NURSING_FACILITY): Payer: Medicare HMO | Admitting: Adult Health

## 2016-11-16 ENCOUNTER — Encounter: Payer: Self-pay | Admitting: Adult Health

## 2016-11-16 DIAGNOSIS — I214 Non-ST elevation (NSTEMI) myocardial infarction: Secondary | ICD-10-CM | POA: Diagnosis not present

## 2016-11-16 DIAGNOSIS — I10 Essential (primary) hypertension: Secondary | ICD-10-CM | POA: Diagnosis not present

## 2016-11-16 DIAGNOSIS — I4891 Unspecified atrial fibrillation: Secondary | ICD-10-CM | POA: Diagnosis not present

## 2016-11-16 DIAGNOSIS — I5032 Chronic diastolic (congestive) heart failure: Secondary | ICD-10-CM

## 2016-11-16 NOTE — Progress Notes (Signed)
Location:   [place] Room Number: 132 A Place of Service:  SNF (31)   CODE STATUS: Full Code  Allergies  Allergen Reactions  . No Known Allergies     Chief Complaint  Patient presents with  . Acute Visit    Care Plan Meeting    HPI:  We are having his routine care plan meeting. His goal remains to return back home. He does live by himself; he has no support system at home. He does recognize that he will need help at home. He will need home health. He continues to work with therapy. He is having difficulty with urinary incontinence and would like a condom cath; which he will provide. There are no nursing concerns. He denies any pain; no insomnia; and no changes in appetite.   Past Medical History:  Diagnosis Date  . Acute on chronic diastolic CHF (congestive heart failure), NYHA class 4 ([place]) 11/09/2016  . AKI (acute kidney injury) ([place]) 11/09/2016  . Anemia   . Arthritis   . Cataracts, bilateral   . Chronic diastolic CHF (congestive heart failure), NYHA class 3 ([place]) 11/09/2016  . Colon polyps   . Depression   . Dyspnea   . Essential hypertension 11/04/2012  . Hyperlipidemia   . Hypertension   . NSTEMI (non-ST elevated myocardial infarction) ([place]) 11/09/2016  . Pneumonia    hx  . Sleep apnea    dx 25 yrs ago used cpap 12 yrs or so-  not used 6-7 yrs  . Varicose veins     Past Surgical History:  Procedure Laterality Date  . ENDOSCOPIC VEIN LASER TREATMENT    . KNEE ARTHROSCOPY Right 07/11/2016  . KNEE ARTHROSCOPY Right 07/11/2016   Procedure: ARTHROSCOPY KNEE;  Surgeon: Frederik Pear, MD;  Location: [facility];  Service: Orthopedics;  Laterality: Right;  . LEFT HEART CATH AND CORONARY ANGIOGRAPHY N/A 11/12/2016   Procedure: LEFT HEART CATH AND CORONARY ANGIOGRAPHY;  Surgeon: Lorretta Harp, MD;  Location: [facility] CV LAB;  Service: Cardiovascular;  Laterality: N/A;  . MENISCUS DEBRIDEMENT Right 07/11/2016   Procedure: DEBRIDEMENT OF Chondromalacia;   Surgeon: Frederik Pear, MD;  Location: [facility];  Service: Orthopedics;  Laterality: Right;  . MENISECTOMY Right 07/11/2016   Procedure: PARTIAL MEDIAL MENISECTOMY;  Surgeon: Frederik Pear, MD;  Location: [facility];  Service: Orthopedics;  Laterality: Right;  . TONSILLECTOMY      Social History   Social History  . Marital status: Single    Spouse name: N/A  . Number of children: N/A  . Years of education: N/A   Occupational History  . Retired    Social History Main Topics  . Smoking status: Never Smoker  . Smokeless tobacco: Never Used  . Alcohol use No  . Drug use: No  . Sexual activity: Not on file   Other Topics Concern  . Not on file   Social History Narrative   Pt lives alone in [place]. Does not have stairs.   Family History  Problem Relation Age of Onset  . Cancer Mother        colon  . Other Mother        varicose veins  . Heart disease Father   . Heart attack Father 82  . Nephrolithiasis Father   . Diabetes Sister        type 1      VITAL SIGNS BP 114/82   Pulse (!) 52   Resp 20   Ht 5\' 9"  (1.753  m)   Wt (!) 363 lb 14.4 oz (165.1 kg)   SpO2 94%   BMI 53.74 kg/m   Patient's Medications  New Prescriptions   No medications on file  Previous Medications   ACETAMINOPHEN (TYLENOL) 325 MG TABLET    Take 650 mg by mouth every 6 (six) hours as needed.   AMIODARONE (PACERONE) 200 MG TABLET    Take 1 tablet (200 mg total) by mouth daily.   AMIODARONE (PACERONE) 200 MG TABLET    Take 200 mg by mouth daily.   APIXABAN (ELIQUIS) 5 MG TABS TABLET    Take 1 tablet (5 mg total) by mouth 2 (two) times daily.   ASPIRIN EC 81 MG TABLET    Take 81 mg by mouth daily.   BUPROPION (WELLBUTRIN XL) 300 MG 24 HR TABLET    Take 300 mg by mouth daily.   CHOLECALCIFEROL (VITAMIN D3) 2000 UNITS TABS    Take 2,000 Units by mouth daily.    DOCUSATE SODIUM (COLACE) 100 MG CAPSULE    Take 100 mg by mouth daily as needed for mild constipation.   FERROUS SULFATE 325 (65 FE) MG TABLET     Take 325 mg by mouth daily with breakfast.   FUROSEMIDE (LASIX) 20 MG TABLET    Take 1 tablet (20 mg total) by mouth daily.   GUAIFENESIN (MUCINEX) 600 MG 12 HR TABLET    Take 600 mg by mouth 2 (two) times daily.   LOSARTAN (COZAAR) 25 MG TABLET    Take 1 tablet (25 mg total) by mouth daily.   MELOXICAM (MOBIC) 15 MG TABLET    Take 15 mg by mouth daily. with food   METOPROLOL SUCCINATE (TOPROL-XL) 50 MG 24 HR TABLET    Take 1 tablet (50 mg total) by mouth daily. Take with or immediately following a meal.   MIRABEGRON ER (MYRBETRIQ) 25 MG TB24 TABLET    Take 25 mg by mouth daily.   MULTIPLE VITAMIN (MULTIVITAMIN) TABLET    Take 1 tablet by mouth daily.   ROSUVASTATIN (CRESTOR) 5 MG TABLET    Take 1 tablet (5 mg total) by mouth at bedtime.  Modified Medications   No medications on file  Discontinued Medications   AMIODARONE (PACERONE) 400 MG TABLET    Take 1 tablet (400 mg total) by mouth 2 (two) times daily.   AMOXICILLIN-CLAVULANATE (AUGMENTIN) 875-125 MG TABLET    Take 1 tablet by mouth every 12 (twelve) hours.     SIGNIFICANT DIAGNOSTIC EXAMS  PREVIOUS:   11-09-16: chest x-ray: No active disease.  11-11-16: 2-d echo:  - Left ventricle: The cavity size was normal. There was moderate concentric hypertrophy. Systolic function was mildly reduced. The estimated ejection fraction was in the range of 45% to 50%.  Diffuse hypokinesis. Features are consistent with a pseudonormal  left ventricular filling pattern, with concomitant abnormal relaxation and increased filling pressure (grade 2 diastolic dysfunction). - Aortic valve: Transvalvular velocity was within the normal range. There was no stenosis. There was no regurgitation. - Mitral valve: Transvalvular velocity was within the normal range. There was no evidence for stenosis. There was trivial  regurgitation. - Left atrium: The atrium was severely dilated. - Right ventricle: The cavity size was normal. Wall thickness was normal. Systolic  function was normal. - Atrial septum: No defect or patent foramen ovale was identified. - Tricuspid valve: There was mild regurgitation. - Pulmonary arteries: Systolic pressure was severely increased. PA peak pressure: 62 mm Hg (S).  11-12-16:  cardiac cath: IMPRESSION:Mr. Mester has normal coronary arteries and mildly elevated LVEDP. I believe that his troponin elevation was related to demand ischemia from A. Fib with RVR. Continue medical therapy will be recommended.The sheath was removed and pressure will be held on the right groin to achieve hemostasis. The patient left the lab in stable condition. Because of his size and going to put him on Unit 6C for  closer groin observation.   11-14-16: chest x-ray: Cardiomegaly with vascular congestion and basilar atelectasis. Lower lung volumes.  NO NEW EXAMS  LABS REVIEWED: PREVIOUS:   11-09-16: wbc 13.2; hgb 14.9; hct 45.6; mcv 89.6; plt 233; glucose 167; bun 24; creat 2.11; k+ 4.0; na++ 140; ca 8.8; BNP 656.2 hgb a1c: 6.3 11-10-16: wbc 10.1; hgb 12.8; hct 41.2; mcv 90.0; plt 145; glucose  95; bun 27; creat 1.69; k+ 3.6; na++ 138; ca 8.4 11-11-16: wbc 11.5; hgb 13.4; hct 42.0;mcv 90.1 plt 147 11-13-16: wbc 10.4;hgb 12.7; hct 40.0; mcv 89.5; plt 147; glucose 149; bun 19; creat 1.15; k+ 3.7; na++ 138; ca 8.7; chol 1442; ldl 77; trig 79; hdl 49  11-14-16: wbc 12.0; hgb 13.0; hct 41.2; mcv 88.8; plt 170; glucose 84; bun 21; creat 1.11; k+ 3.8; na++ 139; ca 8.6   NO NEW LABS   Review of Systems  Constitutional: Negative for malaise/fatigue.  Respiratory: Negative for cough and shortness of breath.   Cardiovascular: Negative for chest pain, palpitations and leg swelling.  Gastrointestinal: Negative for abdominal pain, constipation and heartburn.  Genitourinary:       Has stress incontinence   Musculoskeletal: Negative for back pain, joint pain and myalgias.  Skin: Negative.   Neurological: Negative for dizziness.  Psychiatric/Behavioral: The  patient is not nervous/anxious.    Physical Exam  Constitutional: He is oriented to person, place, and time. He appears well-developed and well-nourished. No distress.  Morbid obesity   Neck: Neck supple. No thyromegaly present.  Cardiovascular: Normal rate, regular rhythm, normal heart sounds and intact distal pulses.  Pulmonary/Chest: Effort normal and breath sounds normal. No respiratory distress.  Abdominal: Soft. Bowel sounds are normal. He exhibits no distension. There is no tenderness.  Musculoskeletal: Normal range of motion. He exhibits no edema.  Lymphadenopathy:    He has no cervical adenopathy.  Neurological: He is alert and oriented to person, place, and time.  Skin: Skin is warm and dry. He is not diaphoretic.  Psychiatric: He has a normal mood and affect.    ASSESSMENT/ PLAN:  TODAY:   1. afib with rvr:   2. Acute on chronic diastolic heart failure NYHA class 3:  (EF 45-50%):  3. Hypertension:  4. NSTEMI:    The plan is for him to return back home  will continue therapy as directed to improve upon his level of independence with his adls  Time spent with patient: 30 minutes: discussed continued plan of care here; discharge plans; what to expect upon discharge and needs; and medications. Patient verbalized understanding  MD is aware of resident's narcotic use and is in agreement with current plan of care. We will attempt to wean resident as apropriate     Ok Edwards NP [facility]  Contact [phone] Monday through Friday 8am- 5pm  After hours call [phone]

## 2016-11-22 ENCOUNTER — Non-Acute Institutional Stay (SKILLED_NURSING_FACILITY): Payer: Medicare HMO | Admitting: Internal Medicine

## 2016-11-22 ENCOUNTER — Encounter: Payer: Self-pay | Admitting: Internal Medicine

## 2016-11-22 DIAGNOSIS — G4733 Obstructive sleep apnea (adult) (pediatric): Secondary | ICD-10-CM | POA: Diagnosis not present

## 2016-11-22 DIAGNOSIS — I5032 Chronic diastolic (congestive) heart failure: Secondary | ICD-10-CM | POA: Diagnosis not present

## 2016-11-22 DIAGNOSIS — I248 Other forms of acute ischemic heart disease: Secondary | ICD-10-CM

## 2016-11-22 DIAGNOSIS — R7303 Prediabetes: Secondary | ICD-10-CM

## 2016-11-22 DIAGNOSIS — Z6841 Body Mass Index (BMI) 40.0 and over, adult: Secondary | ICD-10-CM

## 2016-11-22 DIAGNOSIS — I10 Essential (primary) hypertension: Secondary | ICD-10-CM

## 2016-11-22 DIAGNOSIS — I480 Paroxysmal atrial fibrillation: Secondary | ICD-10-CM

## 2016-11-22 DIAGNOSIS — M25561 Pain in right knee: Secondary | ICD-10-CM | POA: Diagnosis not present

## 2016-11-22 DIAGNOSIS — E782 Mixed hyperlipidemia: Secondary | ICD-10-CM | POA: Diagnosis not present

## 2016-11-22 NOTE — Progress Notes (Signed)
Patient ID: Justin Tate, male   DOB: 1945/03/04, 71 y.o.   MRN: [id number]     HISTORY AND PHYSICAL   DATE:  November 22, 2016  Location:   [facility] Room Number: 132 A Place of Service: SNF (31)   Extended Emergency Contact Information Primary Emergency Contact: Larose [place] Phone: [phone] Relation: Sister Secondary Emergency Contact: Lew,Ameel  [place] Phone: [phone] Relation: Other  Advanced Directive information Does Patient Have a Medical Advance Directive?: No, Would patient like information on creating a medical advance directive?: No - Patient declined  Chief Complaint  Patient presents with  . New Admit To SNF    Admission    HPI:  71 yo male seen today as a new admission into SNF from ED due to generalized weakness. Prior to ED presentation, he was d/c'd to home from 6 day hospital stay s/p NSTEMI, tachyarrythmia, PAF, chronic diastolic HF, AKI, hyperlipidemia, pre diabetes. Left heart cath on 11/12/16 showed nml coronaries and mildly elevated LVEDP. cardio felt elevated Trp related to demand ischemia from afib with RVR and opted for medical tx. Cr 2.11--> 1.11. He presents to SNF for short term rehab  He has no concerns today. He is tolerating tx. No palpitations, CP, SOB, cough. He does have right knee pain 2/2 bruise he sustained s/p fall at home. He does use CPAP as ordered.  PAF - rate controlled on amiodarone 200 mg daily; toprol xl 50 mg daily. He takes ASA for anticoagulation  Chronic diastolic heart failure NYHA class 3 -  EF 45-50%. Stable on lasix 20 mg daily; toprol xl 50 mg daily. Followed by cardio   Hypertension - BP stable on cozaar 25 mg daily and toprol xl 50 mg daily; ASA daily  Depression with anxiety - mood stable on wellbutrin xl 300 mg daily  Anemia - stable on iron daily. Hgb 13   Stress urge incontinence - stable on myrbetriq 25 mg daily   Dyslipidemia: stable  on crestor 5 mg daily. LDL 77  Arthritis with hx right knee meniscal tear arthroscopy - takes mobic 15 mg daily   Morbid obesity - BMI 53.74. He is on heart healthy diet and encourage caloric reduction   Chronic Constipation with intermittent diarrhea - stable on colace daily as needed  OSA - stable on CPAP qHS. He also uses [place] O2 at 2L/min  Prediabetes - A1c 6.3%  Past Medical History:  Diagnosis Date  . Acute on chronic diastolic CHF (congestive heart failure), NYHA class 4 ([facility]) 11/09/2016  . AKI (acute kidney injury) ([facility]) 11/09/2016  . Anemia   . Arthritis   . Cataracts, bilateral   . Chronic diastolic CHF (congestive heart failure), NYHA class 3 ([facility]) 11/09/2016  . Colon polyps   . Depression   . Dyspnea   . Essential hypertension 11/04/2012  . Hyperlipidemia   . Hypertension   . NSTEMI (non-ST elevated myocardial infarction) ([facility]) 11/09/2016  . Pneumonia    hx  . Sleep apnea    dx 25 yrs ago used cpap 12 yrs or so-  not used 6-7 yrs  . Varicose veins     Past Surgical History:  Procedure Laterality Date  . ENDOSCOPIC VEIN LASER TREATMENT    . KNEE ARTHROSCOPY Right 07/11/2016  . KNEE ARTHROSCOPY Right 07/11/2016   Procedure: ARTHROSCOPY KNEE;  Surgeon: Frederik Pear, MD;  Location: [facility];  Service: Orthopedics;  Laterality: Right;  . LEFT HEART  CATH AND CORONARY ANGIOGRAPHY N/A 11/12/2016   Procedure: LEFT HEART CATH AND CORONARY ANGIOGRAPHY;  Surgeon: Lorretta Harp, MD;  Location: [facility];  Service: Cardiovascular;  Laterality: N/A;  . MENISCUS DEBRIDEMENT Right 07/11/2016   Procedure: DEBRIDEMENT OF Chondromalacia;  Surgeon: Frederik Pear, MD;  Location: [facility];  Service: Orthopedics;  Laterality: Right;  . MENISECTOMY Right 07/11/2016   Procedure: PARTIAL MEDIAL MENISECTOMY;  Surgeon: Frederik Pear, MD;  Location: [facility];  Service: Orthopedics;  Laterality: Right;  . TONSILLECTOMY      Patient Care Team: Lawerance Cruel, MD as PCP - General  (Family Medicine) Maia Breslow, MD as Consulting Physician (Orthopedic Surgery)  Social History   Social History  . Marital status: Single    Spouse name: N/A  . Number of children: N/A  . Years of education: N/A   Occupational History  . Retired    Social History Main Topics  . Smoking status: Never Smoker  . Smokeless tobacco: Never Used  . Alcohol use No  . Drug use: No  . Sexual activity: Not on file   Other Topics Concern  . Not on file   Social History Narrative   Pt lives alone in [place]. Does not have stairs.     reports that he has never smoked. He has never used smokeless tobacco. He reports that he does not drink alcohol or use drugs.  Family History  Problem Relation Age of Onset  . Cancer Mother        colon  . Other Mother        varicose veins  . Heart disease Father   . Heart attack Father 59  . Nephrolithiasis Father   . Diabetes Sister        type 1   Family Status  Relation Status  . Mother Deceased  . Father Deceased  . Sister Alive    Immunization History  Administered Date(s) Administered  . PPD Test 11/22/2016    Allergies  Allergen Reactions  . No Known Allergies     Medications: Patient's Medications  New Prescriptions   No medications on file  Previous Medications   ACETAMINOPHEN (TYLENOL) 325 MG TABLET    Take 650 mg by mouth every 6 (six) hours as needed.   AMIODARONE (PACERONE) 200 MG TABLET    Take 1 tablet (200 mg total) by mouth daily.   AMIODARONE (PACERONE) 200 MG TABLET    Take 200 mg by mouth daily.   APIXABAN (ELIQUIS) 5 MG TABS TABLET    Take 1 tablet (5 mg total) by mouth 2 (two) times daily.   ASPIRIN EC 81 MG TABLET    Take 81 mg by mouth daily.   BUPROPION (WELLBUTRIN XL) 300 MG 24 HR TABLET    Take 300 mg by mouth daily.   CHOLECALCIFEROL (VITAMIN D3) 2000 UNITS TABS    Take 2,000 Units by mouth daily.    DOCUSATE SODIUM (COLACE) 100 MG CAPSULE    Take 100 mg by mouth daily as needed for mild  constipation.   FERROUS SULFATE 325 (65 FE) MG TABLET    Take 325 mg by mouth daily with breakfast.   FUROSEMIDE (LASIX) 20 MG TABLET    Take 1 tablet (20 mg total) by mouth daily.   GUAIFENESIN (MUCINEX) 600 MG 12 HR TABLET    Take 600 mg by mouth 2 (two) times daily.   LOSARTAN (COZAAR) 25 MG TABLET    Take 1 tablet (25 mg  total) by mouth daily.   MELOXICAM (MOBIC) 15 MG TABLET    Take 15 mg by mouth daily. with food   METOPROLOL SUCCINATE (TOPROL-XL) 50 MG 24 HR TABLET    Take 1 tablet (50 mg total) by mouth daily. Take with or immediately following a meal.   MIRABEGRON ER (MYRBETRIQ) 25 MG TB24 TABLET    Take 25 mg by mouth daily.   MULTIPLE VITAMIN (MULTIVITAMIN) TABLET    Take 1 tablet by mouth daily.   OXYGEN    2lpm via nasal cannula at night to maintain o2 greater than 90%   ROSUVASTATIN (CRESTOR) 5 MG TABLET    Take 1 tablet (5 mg total) by mouth at bedtime.  Modified Medications   No medications on file  Discontinued Medications   No medications on file    Review of Systems  Musculoskeletal: Positive for arthralgias and gait problem.  Neurological: Positive for weakness.  All other systems reviewed and are negative.   Vitals:   11/22/16 0922  BP: 114/82  Pulse: (!) 52  Resp: 20  Temp: (!) 94.5 F (34.7 C)  SpO2: 96%  Weight: (!) 363 lb 14.4 oz (165.1 kg)  Height: 5' 9" (1.753 m)   Body mass index is 53.74 kg/m.  Physical Exam  Constitutional: He is oriented to person, place, and time. He appears well-developed and well-nourished.  Sitting on bed in NAD, obese  HENT:  Mouth/Throat: Oropharynx is clear and moist.  MMM; no oral thrush  Eyes: Pupils are equal, round, and reactive to light. No scleral icterus.  Neck: Neck supple. Carotid bruit is not present. No thyromegaly present.  Cardiovascular: Normal rate, regular rhythm and intact distal pulses. Exam reveals no gallop and no friction rub.  Murmur (1/6 SEM) heard. L>RLE edema with palpable soft varicose  veins. No calf TTP  Pulmonary/Chest: Effort normal and breath sounds normal. He has no wheezes. He has no rales. He exhibits no tenderness.  Abdominal: Soft. Normal appearance and bowel sounds are normal. He exhibits no distension, no abdominal bruit, no pulsatile midline mass and no mass. There is no hepatomegaly. There is no tenderness. There is no rigidity, no rebound and no guarding. No hernia.  obese  Musculoskeletal: He exhibits edema (right knee swelling with reduced ROM).  Lymphadenopathy:    He has no cervical adenopathy.  Neurological: He is alert and oriented to person, place, and time.  Skin: Skin is warm and dry. No rash noted.  Psychiatric: He has a normal mood and affect. His behavior is normal. Judgment and thought content normal.     Labs reviewed: Admission on 11/14/2016, Discharged on 11/14/2016  Component Date Value Ref Range Status  . Sodium 11/14/2016 139  135 - 145 mmol/L Final  . Potassium 11/14/2016 3.8  3.5 - 5.1 mmol/L Final  . Chloride 11/14/2016 104  101 - 111 mmol/L Final  . CO2 11/14/2016 24  22 - 32 mmol/L Final  . Glucose, Bld 11/14/2016 84  65 - 99 mg/dL Final  . BUN 11/14/2016 21* 6 - 20 mg/dL Final  . Creatinine, Ser 11/14/2016 1.11  0.61 - 1.24 mg/dL Final  . Calcium 11/14/2016 8.6* 8.9 - 10.3 mg/dL Final  . GFR calc non Af Amer 11/14/2016 >60  >60 mL/min Final  . GFR calc Af Amer 11/14/2016 >60  >60 mL/min Final   Comment: (NOTE) The eGFR has been calculated using the CKD EPI equation. This calculation has not been validated in all clinical situations. eGFR's persistently <60  mL/min signify possible Chronic Kidney Disease.   . Anion gap 11/14/2016 11  5 - 15 Final  . WBC 11/14/2016 12.0* 4.0 - 10.5 K/uL Final  . RBC 11/14/2016 4.64  4.22 - 5.81 MIL/uL Final  . Hemoglobin 11/14/2016 13.0  13.0 - 17.0 g/dL Final  . HCT 11/14/2016 41.2  39.0 - 52.0 % Final  . MCV 11/14/2016 88.8  78.0 - 100.0 fL Final  . MCH 11/14/2016 28.0  26.0 - 34.0 pg  Final  . MCHC 11/14/2016 31.6  30.0 - 36.0 g/dL Final  . RDW 11/14/2016 14.4  11.5 - 15.5 % Final  . Platelets 11/14/2016 170  150 - 400 K/uL Final  . Color, Urine 11/14/2016 YELLOW  YELLOW Final  . APPearance 11/14/2016 CLEAR  CLEAR Final  . Specific Gravity, Urine 11/14/2016 1.019  1.005 - 1.030 Final  . pH 11/14/2016 7.0  5.0 - 8.0 Final  . Glucose, UA 11/14/2016 NEGATIVE  NEGATIVE mg/dL Final  . Hgb urine dipstick 11/14/2016 NEGATIVE  NEGATIVE Final  . Bilirubin Urine 11/14/2016 NEGATIVE  NEGATIVE Final  . Ketones, ur 11/14/2016 5* NEGATIVE mg/dL Final  . Protein, ur 11/14/2016 NEGATIVE  NEGATIVE mg/dL Final  . Nitrite 11/14/2016 NEGATIVE  NEGATIVE Final  . Leukocytes, UA 11/14/2016 NEGATIVE  NEGATIVE Final  . Glucose-Capillary 11/14/2016 75  65 - 99 mg/dL Final  Admission on 11/09/2016, Discharged on 11/13/2016  Component Date Value Ref Range Status  . Troponin i, poc 11/09/2016 0.48* 0.00 - 0.08 ng/mL Final  . Comment 11/09/2016 NOTIFIED PHYSICIAN   Final  . Comment 3 11/09/2016          Final   Comment: Due to the release kinetics of cTnI, a negative result within the first hours of the onset of symptoms does not rule out myocardial infarction with certainty. If myocardial infarction is still suspected, repeat the test at appropriate intervals.   . Sodium 11/09/2016 140  135 - 145 mmol/L Final  . Potassium 11/09/2016 4.0  3.5 - 5.1 mmol/L Final  . Chloride 11/09/2016 106  101 - 111 mmol/L Final  . CO2 11/09/2016 19* 22 - 32 mmol/L Final  . Glucose, Bld 11/09/2016 167* 65 - 99 mg/dL Final  . BUN 11/09/2016 24* 6 - 20 mg/dL Final  . Creatinine, Ser 11/09/2016 2.11* 0.61 - 1.24 mg/dL Final  . Calcium 11/09/2016 8.8* 8.9 - 10.3 mg/dL Final  . GFR calc non Af Amer 11/09/2016 30* >60 mL/min Final  . GFR calc Af Amer 11/09/2016 35* >60 mL/min Final   Comment: (NOTE) The eGFR has been calculated using the CKD EPI equation. This calculation has not been validated in all clinical  situations. eGFR's persistently <60 mL/min signify possible Chronic Kidney Disease.   . Anion gap 11/09/2016 15  5 - 15 Final  . Troponin I 11/09/2016 0.62* <0.03 ng/mL Final   Comment: CRITICAL RESULT CALLED TO, READ BACK BY AND VERIFIED WITH: Cathe Mons [id number] @ 0416 BY J SCOTTON   . WBC 11/09/2016 13.2* 4.0 - 10.5 K/uL Final  . RBC 11/09/2016 5.09  4.22 - 5.81 MIL/uL Final  . Hemoglobin 11/09/2016 14.9  13.0 - 17.0 g/dL Final  . HCT 11/09/2016 45.6  39.0 - 52.0 % Final  . MCV 11/09/2016 89.6  78.0 - 100.0 fL Final  . MCH 11/09/2016 29.3  26.0 - 34.0 pg Final  . MCHC 11/09/2016 32.7  30.0 - 36.0 g/dL Final  . RDW 11/09/2016 14.2  11.5 - 15.5 % Final  . Platelets  11/09/2016 233  150 - 400 K/uL Final  . Neutrophils Relative % 11/09/2016 85  % Final  . Neutro Abs 11/09/2016 11.2* 1.7 - 7.7 K/uL Final  . Lymphocytes Relative 11/09/2016 9  % Final  . Lymphs Abs 11/09/2016 1.2  0.7 - 4.0 K/uL Final  . Monocytes Relative 11/09/2016 6  % Final  . Monocytes Absolute 11/09/2016 0.7  0.1 - 1.0 K/uL Final  . Eosinophils Relative 11/09/2016 0  % Final  . Eosinophils Absolute 11/09/2016 0.0  0.0 - 0.7 K/uL Final  . Basophils Relative 11/09/2016 0  % Final  . Basophils Absolute 11/09/2016 0.0  0.0 - 0.1 K/uL Final  . Troponin I 11/09/2016 3.77* <0.03 ng/mL Final   Comment: DELTA CHECK NOTED REPEATED TO VERIFY CRITICAL RESULT CALLED TO, READ BACK BY AND VERIFIED WITH: LASSITER,A. RN AT 0945 11/09/16 MULLINS,T   . pH, Gastric 11/09/2016 1   Final  . Occult Blood, Gastric 11/09/2016 NEGATIVE  NEGATIVE Final  . Heparin Unfractionated 11/09/2016 0.20* 0.30 - 0.70 IU/mL Final   Comment:        IF HEPARIN RESULTS ARE BELOW EXPECTED VALUES, AND PATIENT DOSAGE HAS BEEN CONFIRMED, SUGGEST FOLLOW UP TESTING OF ANTITHROMBIN III LEVELS.   Marland Kitchen Total Protein 11/09/2016 6.7  6.5 - 8.1 g/dL Final  . Albumin 11/09/2016 3.3* 3.5 - 5.0 g/dL Final  . AST 11/09/2016 63* 15 - 41 U/L Final  . ALT  11/09/2016 30  17 - 63 U/L Final  . Alkaline Phosphatase 11/09/2016 64  38 - 126 U/L Final  . Total Bilirubin 11/09/2016 0.6  0.3 - 1.2 mg/dL Final  . Bilirubin, Direct 11/09/2016 <0.1* 0.1 - 0.5 mg/dL Final   REPEATED TO VERIFY  . Indirect Bilirubin 11/09/2016 NOT CALCULATED  0.3 - 0.9 mg/dL Final  . Troponin I 11/09/2016 8.36* <0.03 ng/mL Final   Comment: CRITICAL RESULT CALLED TO, READ BACK BY AND VERIFIED WITH: LASSITER,L AT 1824 ON 11/09/2016 BY MOSLEY,J   . Hgb A1c MFr Bld 11/09/2016 6.3* 4.8 - 5.6 % Final   Comment: (NOTE) Pre diabetes:          5.7%-6.4% Diabetes:              >6.4% Glycemic control for   <7.0% adults with diabetes   . Mean Plasma Glucose 11/09/2016 134.11  mg/dL Final   Performed at [facility] [address] [place], [place] [address]  . B Natriuretic Peptide 11/09/2016 656.2* 0.0 - 100.0 pg/mL Final  . MRSA by PCR 11/09/2016 NEGATIVE  NEGATIVE Final   Comment:        The GeneXpert MRSA Assay (FDA approved for NASAL specimens only), is one component of a comprehensive MRSA colonization surveillance program. It is not intended to diagnose MRSA infection nor to guide or monitor treatment for MRSA infections.   . Sodium 11/10/2016 138  135 - 145 mmol/L Final  . Potassium 11/10/2016 3.6  3.5 - 5.1 mmol/L Final  . Chloride 11/10/2016 101  101 - 111 mmol/L Final  . CO2 11/10/2016 26  22 - 32 mmol/L Final  . Glucose, Bld 11/10/2016 95  65 - 99 mg/dL Final  . BUN 11/10/2016 27* 6 - 20 mg/dL Final  . Creatinine, Ser 11/10/2016 1.69* 0.61 - 1.24 mg/dL Final  . Calcium 11/10/2016 8.4* 8.9 - 10.3 mg/dL Final  . GFR calc non Af Amer 11/10/2016 39* >60 mL/min Final  . GFR calc Af Amer 11/10/2016 46* >60 mL/min Final   Comment: (NOTE)  The eGFR has been calculated using the CKD EPI equation. This calculation has not been validated in all clinical situations. eGFR's persistently <60 mL/min signify possible Chronic Kidney Disease.   . Anion gap  11/10/2016 11  5 - 15 Final  . Weight 11/11/2016 5555.59  oz Final  . Height 11/11/2016 69  in Final  . BP 11/11/2016 128/71  mmHg Final  . VTI 11/11/2016 125  cm Final  . LV PW d 11/11/2016 14* 0.6 - 1.1 mm Final  . FS 11/11/2016 26* 28 - 44 % Final  . LA vol 11/11/2016 128  mL Final  . Ao-asc 11/11/2016 35  cm Final  . LA ID, A-P, ES 11/11/2016 48  mm Final  . IVS/LV PW RATIO, ED 11/11/2016 1   Final  . Reg peak vel 11/11/2016 342  cm/s Final  . RV sys press 11/11/2016 62  mmHg Final  . LA diam index 11/11/2016 1.68  cm/m2 Final  . LA vol A4C 11/11/2016 133  ml Final  . Area-P 1/2 11/11/2016 5.79  cm2 Final  . E decel time 11/11/2016 130  msec Final  . LVOT diameter 11/11/2016 21  mm Final  . LVOT area 11/11/2016 3.46  cm2 Final  . Peak grad 11/11/2016 6  mmHg Final  . MV pk E vel 11/11/2016 119  m/s Final  . TR max vel 11/11/2016 342  cm/s Final  . P 1/2 time 11/11/2016 38  ms Final  . MV pk A vel 11/11/2016 73.2  m/s Final  . LA vol index 11/11/2016 44.8  mL/m2 Final  . MV Dec 11/11/2016 130   Final  . LA diam end sys 11/11/2016 48.00  mm Final  . TAPSE 11/11/2016 20.10  mm Final  . Heparin Unfractionated 11/10/2016 0.20* 0.30 - 0.70 IU/mL Final   Comment:        IF HEPARIN RESULTS ARE BELOW EXPECTED VALUES, AND PATIENT DOSAGE HAS BEEN CONFIRMED, SUGGEST FOLLOW UP TESTING OF ANTITHROMBIN III LEVELS.   . Troponin I 11/10/2016 4.97* <0.03 ng/mL Final   Comment: CRITICAL RESULT CALLED TO, READ BACK BY AND VERIFIED WITH: SCOTT,T RN 11/10/2016 0609 JORDANS   . WBC 11/10/2016 9.8  4.0 - 10.5 K/uL Final  . RBC 11/10/2016 4.53  4.22 - 5.81 MIL/uL Final  . Hemoglobin 11/10/2016 13.1  13.0 - 17.0 g/dL Final  . HCT 11/10/2016 40.5  39.0 - 52.0 % Final  . MCV 11/10/2016 89.4  78.0 - 100.0 fL Final  . MCH 11/10/2016 28.9  26.0 - 34.0 pg Final  . MCHC 11/10/2016 32.3  30.0 - 36.0 g/dL Final  . RDW 11/10/2016 14.6  11.5 - 15.5 % Final  . Platelets 11/10/2016 144* 150 - 400 K/uL  Final  . WBC 11/10/2016 10.1  4.0 - 10.5 K/uL Final  . RBC 11/10/2016 4.58  4.22 - 5.81 MIL/uL Final  . Hemoglobin 11/10/2016 12.8* 13.0 - 17.0 g/dL Final  . HCT 11/10/2016 41.2  39.0 - 52.0 % Final  . MCV 11/10/2016 90.0  78.0 - 100.0 fL Final  . MCH 11/10/2016 27.9  26.0 - 34.0 pg Final  . MCHC 11/10/2016 31.1  30.0 - 36.0 g/dL Final  . RDW 11/10/2016 14.8  11.5 - 15.5 % Final  . Platelets 11/10/2016 145* 150 - 400 K/uL Final  . Heparin Unfractionated 11/10/2016 0.37  0.30 - 0.70 IU/mL Final   Comment:        IF HEPARIN RESULTS ARE BELOW EXPECTED VALUES, AND PATIENT DOSAGE  HAS BEEN CONFIRMED, SUGGEST FOLLOW UP TESTING OF ANTITHROMBIN III LEVELS.   Marland Kitchen Sodium 11/11/2016 139  135 - 145 mmol/L Final  . Potassium 11/11/2016 4.0  3.5 - 5.1 mmol/L Final  . Chloride 11/11/2016 102  101 - 111 mmol/L Final  . CO2 11/11/2016 29  22 - 32 mmol/L Final  . Glucose, Bld 11/11/2016 94  65 - 99 mg/dL Final  . BUN 11/11/2016 20  6 - 20 mg/dL Final  . Creatinine, Ser 11/11/2016 1.33* 0.61 - 1.24 mg/dL Final  . Calcium 11/11/2016 8.8* 8.9 - 10.3 mg/dL Final  . GFR calc non Af Amer 11/11/2016 53* >60 mL/min Final  . GFR calc Af Amer 11/11/2016 >60  >60 mL/min Final   Comment: (NOTE) The eGFR has been calculated using the CKD EPI equation. This calculation has not been validated in all clinical situations. eGFR's persistently <60 mL/min signify possible Chronic Kidney Disease.   . Anion gap 11/11/2016 8  5 - 15 Final  . Heparin Unfractionated 11/11/2016 0.22* 0.30 - 0.70 IU/mL Final   Comment:        IF HEPARIN RESULTS ARE BELOW EXPECTED VALUES, AND PATIENT DOSAGE HAS BEEN CONFIRMED, SUGGEST FOLLOW UP TESTING OF ANTITHROMBIN III LEVELS.   . WBC 11/11/2016 11.5* 4.0 - 10.5 K/uL Final  . RBC 11/11/2016 4.66  4.22 - 5.81 MIL/uL Final  . Hemoglobin 11/11/2016 13.4  13.0 - 17.0 g/dL Final  . HCT 11/11/2016 42.0  39.0 - 52.0 % Final  . MCV 11/11/2016 90.1  78.0 - 100.0 fL Final  . MCH 11/11/2016  28.8  26.0 - 34.0 pg Final  . MCHC 11/11/2016 31.9  30.0 - 36.0 g/dL Final  . RDW 11/11/2016 14.5  11.5 - 15.5 % Final  . Platelets 11/11/2016 116* 150 - 400 K/uL Final   Comment: REPEATED TO VERIFY SPECIMEN CHECKED FOR CLOTS PLATELET COUNT CONFIRMED BY SMEAR   . TSH 11/11/2016 1.980  0.350 - 4.500 uIU/mL Final   Performed by a 3rd Generation assay with a functional sensitivity of <=0.01 uIU/mL.  Marland Kitchen Prothrombin Time 11/11/2016 15.2  11.4 - 15.2 seconds Final  . INR 11/11/2016 1.20   Final  . Glucose-Capillary 11/10/2016 103* 65 - 99 mg/dL Final  . Glucose-Capillary 11/11/2016 90  65 - 99 mg/dL Final  . Glucose-Capillary 11/11/2016 131* 65 - 99 mg/dL Final  . Sodium 11/12/2016 135  135 - 145 mmol/L Final  . Potassium 11/12/2016 4.1  3.5 - 5.1 mmol/L Final  . Chloride 11/12/2016 103  101 - 111 mmol/L Final  . CO2 11/12/2016 25  22 - 32 mmol/L Final  . Glucose, Bld 11/12/2016 96  65 - 99 mg/dL Final  . BUN 11/12/2016 17  6 - 20 mg/dL Final  . Creatinine, Ser 11/12/2016 1.15  0.61 - 1.24 mg/dL Final  . Calcium 11/12/2016 8.3* 8.9 - 10.3 mg/dL Final  . GFR calc non Af Amer 11/12/2016 >60  >60 mL/min Final  . GFR calc Af Amer 11/12/2016 >60  >60 mL/min Final   Comment: (NOTE) The eGFR has been calculated using the CKD EPI equation. This calculation has not been validated in all clinical situations. eGFR's persistently <60 mL/min signify possible Chronic Kidney Disease.   . Anion gap 11/12/2016 7  5 - 15 Final  . Heparin Unfractionated 11/12/2016 0.14* 0.30 - 0.70 IU/mL Final   Comment:        IF HEPARIN RESULTS ARE BELOW EXPECTED VALUES, AND PATIENT DOSAGE HAS BEEN CONFIRMED, SUGGEST FOLLOW UP TESTING  OF ANTITHROMBIN III LEVELS.   . WBC 11/12/2016 12.0* 4.0 - 10.5 K/uL Final  . RBC 11/12/2016 4.52  4.22 - 5.81 MIL/uL Final  . Hemoglobin 11/12/2016 12.8* 13.0 - 17.0 g/dL Final  . HCT 11/12/2016 40.8  39.0 - 52.0 % Final  . MCV 11/12/2016 90.3  78.0 - 100.0 fL Final  . MCH  11/12/2016 28.3  26.0 - 34.0 pg Final  . MCHC 11/12/2016 31.4  30.0 - 36.0 g/dL Final  . RDW 11/12/2016 14.4  11.5 - 15.5 % Final  . Platelets 11/12/2016 155  150 - 400 K/uL Final  . Magnesium 11/12/2016 1.8  1.7 - 2.4 mg/dL Final  . Glucose-Capillary 11/11/2016 100* 65 - 99 mg/dL Final  . Glucose-Capillary 11/11/2016 129* 65 - 99 mg/dL Final  . Heparin Unfractionated 11/12/2016 <0.10* 0.30 - 0.70 IU/mL Final   Comment:        IF HEPARIN RESULTS ARE BELOW EXPECTED VALUES, AND PATIENT DOSAGE HAS BEEN CONFIRMED, SUGGEST FOLLOW UP TESTING OF ANTITHROMBIN III LEVELS.   . Glucose-Capillary 11/12/2016 105* 65 - 99 mg/dL Final  . Sodium 11/13/2016 138  135 - 145 mmol/L Final  . Potassium 11/13/2016 3.7  3.5 - 5.1 mmol/L Final  . Chloride 11/13/2016 104  101 - 111 mmol/L Final  . CO2 11/13/2016 26  22 - 32 mmol/L Final  . Glucose, Bld 11/13/2016 149* 65 - 99 mg/dL Final  . BUN 11/13/2016 19  6 - 20 mg/dL Final  . Creatinine, Ser 11/13/2016 1.15  0.61 - 1.24 mg/dL Final  . Calcium 11/13/2016 8.7* 8.9 - 10.3 mg/dL Final  . GFR calc non Af Amer 11/13/2016 >60  >60 mL/min Final  . GFR calc Af Amer 11/13/2016 >60  >60 mL/min Final   Comment: (NOTE) The eGFR has been calculated using the CKD EPI equation. This calculation has not been validated in all clinical situations. eGFR's persistently <60 mL/min signify possible Chronic Kidney Disease.   . Anion gap 11/13/2016 8  5 - 15 Final  . WBC 11/13/2016 10.4  4.0 - 10.5 K/uL Final  . RBC 11/13/2016 4.47  4.22 - 5.81 MIL/uL Final  . Hemoglobin 11/13/2016 12.7* 13.0 - 17.0 g/dL Final  . HCT 11/13/2016 40.0  39.0 - 52.0 % Final  . MCV 11/13/2016 89.5  78.0 - 100.0 fL Final  . MCH 11/13/2016 28.4  26.0 - 34.0 pg Final  . MCHC 11/13/2016 31.8  30.0 - 36.0 g/dL Final  . RDW 11/13/2016 14.2  11.5 - 15.5 % Final  . Platelets 11/13/2016 147* 150 - 400 K/uL Final  . Cholesterol 11/13/2016 142  0 - 200 mg/dL Final  . Triglycerides 11/13/2016 79  <150  mg/dL Final  . HDL 11/13/2016 49  >40 mg/dL Final  . Total CHOL/HDL Ratio 11/13/2016 2.9  RATIO Final  . VLDL 11/13/2016 16  0 - 40 mg/dL Final  . LDL Cholesterol 11/13/2016 77  0 - 99 mg/dL Final   Comment:        Total Cholesterol/HDL:CHD Risk Coronary Heart Disease Risk Table                     Men   Women  1/2 Average Risk   3.4   3.3  Average Risk       5.0   4.4  2 X Average Risk   9.6   7.1  3 X Average Risk  23.4   11.0        Use the  calculated Patient Ratio above and the CHD Risk Table to determine the patient's CHD Risk.        ATP III CLASSIFICATION (LDL):  <100     mg/dL   Optimal  100-129  mg/dL   Near or Above                    Optimal  130-159  mg/dL   Borderline  160-189  mg/dL   High  >190     mg/dL   Very High   . Activated Clotting Time 11/12/2016 147  seconds Final    Dg Chest 2 View  Result Date: 11/14/2016 CLINICAL DATA:  Weakness, fall, shortness of breath EXAM: CHEST  2 VIEW COMPARISON:  11/09/2016 FINDINGS: Cardiomegaly evident with central vascular congestion and lower lung volumes. Associated bibasilar atelectasis. No definite focal pneumonia, significant collapse or consolidation. Negative for edema, large effusion or pneumothorax. Right costophrenic angle is excluded on the study. Trachea is midline. Degenerative changes of the spine and associated scoliosis. IMPRESSION: Cardiomegaly with vascular congestion and basilar atelectasis. Lower lung volumes. Electronically Signed   By: Jerilynn Mages.  Shick M.D.   On: 11/14/2016 08:36   Dg Chest Port 1 View  Result Date: 11/09/2016 CLINICAL DATA:  Heart palpitations and shortness of breath. EXAM: PORTABLE CHEST 1 VIEW COMPARISON:  07/03/2016 FINDINGS: Shallow inspiration. Normal heart size and pulmonary vascularity. No focal airspace disease or consolidation in the lungs. No blunting of costophrenic angles. No pneumothorax. Mediastinal contours appear intact. IMPRESSION: No active disease. Electronically Signed    By: Lucienne Capers M.D.   On: 11/09/2016 04:27     Assessment/Plan   ICD-10-CM   1. Acute pain of right knee M25.561    s/p fall at home prior to SNF admission  2. Chronic diastolic CHF (congestive heart failure), NYHA class 3 (HCC) I50.32   3. Essential hypertension I10   4. PAF (paroxysmal atrial fibrillation) (HCC) I48.0   5. Morbid obesity with BMI of 50.0-59.9, adult ([place]) E66.01    Z68.43   6. Demand ischemia (HCC) I24.8    2/2 afib with RVR which has resolved  7. Mixed dyslipidemia E78.2   8. Prediabetes R73.03   9. OSA (obstructive sleep apnea) G47.33     Cont current meds as ordered  Daily weights and record  F/u with cardio as scheduled  PT/OT as ordered  Increase exercise as tolerated  Cont CPAP qHS  GOAL: short term rehab and d/c home when medically appropriate. Communicated with pt and nursing.  Will follow  Monica S. Perlie Gold  [facility] [address] [place], [place] [address] [phone] Cell (Monday-Friday 8 AM - 5 PM) [phone] After 5 PM and follow prompts

## 2016-11-26 ENCOUNTER — Non-Acute Institutional Stay (SKILLED_NURSING_FACILITY): Payer: Medicare HMO | Admitting: Adult Health

## 2016-11-26 ENCOUNTER — Encounter: Payer: Self-pay | Admitting: Adult Health

## 2016-11-26 DIAGNOSIS — I214 Non-ST elevation (NSTEMI) myocardial infarction: Secondary | ICD-10-CM

## 2016-11-26 DIAGNOSIS — I4891 Unspecified atrial fibrillation: Secondary | ICD-10-CM

## 2016-11-26 DIAGNOSIS — I5032 Chronic diastolic (congestive) heart failure: Secondary | ICD-10-CM | POA: Diagnosis not present

## 2016-11-26 NOTE — Progress Notes (Signed)
Location:   [facility] Room Number: 132 A Place of Service:  SNF (31)    CODE STATUS: Full Code  Allergies  Allergen Reactions  . No Known Allergies     Chief Complaint  Patient presents with  . Discharge Note    Discharging 11/28/16    HPI:  He is being discharged to home with home health for pt/ot/rn. He will need a semi-electric hospital bed; a standard wheelchair and 3:1 commode. He will need his prescriptions written and will need to follow up with his medical provider.  He has been hospitalized for an acute NSTEMI. He was admitted to this facility being seen for short term rehab. He is now ready to return home.    Past Medical History:  Diagnosis Date  . Acute on chronic diastolic CHF (congestive heart failure), NYHA class 4 ([place]) 11/09/2016  . AKI (acute kidney injury) ([place]) 11/09/2016  . Anemia   . Arthritis   . Cataracts, bilateral   . Chronic diastolic CHF (congestive heart failure), NYHA class 3 ([place]) 11/09/2016  . Colon polyps   . Depression   . Dyspnea   . Essential hypertension 11/04/2012  . Hyperlipidemia   . Hypertension   . NSTEMI (non-ST elevated myocardial infarction) ([place]) 11/09/2016  . Pneumonia    hx  . Sleep apnea    dx 25 yrs ago used cpap 12 yrs or so-  not used 6-7 yrs  . Varicose veins     Past Surgical History:  Procedure Laterality Date  . ENDOSCOPIC VEIN LASER TREATMENT    . KNEE ARTHROSCOPY Right 07/11/2016  . TONSILLECTOMY      Social History   Socioeconomic History  . Marital status: Single    Spouse name: Not on file  . Number of children: Not on file  . Years of education: Not on file  . Highest education level: Not on file  Social Needs  . Financial resource strain: Not on file  . Food insecurity - worry: Not on file  . Food insecurity - inability: Not on file  . Transportation needs - medical: Not on file  . Transportation needs - non-medical: Not on file  Occupational History  . Occupation: Retired   Tobacco Use  . Smoking status: Never Smoker  . Smokeless tobacco: Never Used  Substance and Sexual Activity  . Alcohol use: No  . Drug use: No  . Sexual activity: Not on file  Other Topics Concern  . Not on file  Social History Narrative   Pt lives alone in [place]. Does not have stairs.   Family History  Problem Relation Age of Onset  . Cancer Mother        colon  . Other Mother        varicose veins  . Heart disease Father   . Heart attack Father 64  . Nephrolithiasis Father   . Diabetes Sister        type 1    VITAL SIGNS BP 128/74   Pulse 64   Temp (!) 96.9 F (36.1 C)   Resp 20   Ht 5\' 9"  (1.753 m)   Wt (!) 346 lb 3.2 oz (157 kg)   SpO2 96%   BMI 51.12 kg/m     Medication List        Accurate as of 11/26/16 12:19 PM. Always use your most recent med list.          acetaminophen 325 MG tablet Commonly known as:  TYLENOL   amiodarone 200 MG tablet Commonly known as:  PACERONE Take 1 tablet (200 mg total) by mouth daily.   apixaban 5 MG Tabs tablet Commonly known as:  ELIQUIS Take 1 tablet (5 mg total) by mouth 2 (two) times daily.   aspirin EC 81 MG tablet   buPROPion 300 MG 24 hr tablet Commonly known as:  WELLBUTRIN XL   docusate sodium 100 MG capsule Commonly known as:  COLACE   ferrous sulfate 325 (65 FE) MG tablet   furosemide 20 MG tablet Commonly known as:  LASIX Take 1 tablet (20 mg total) by mouth daily.   losartan 25 MG tablet Commonly known as:  COZAAR Take 1 tablet (25 mg total) by mouth daily.   meloxicam 15 MG tablet Commonly known as:  MOBIC   metoprolol succinate 50 MG 24 hr tablet Commonly known as:  TOPROL-XL Take 1 tablet (50 mg total) by mouth daily. Take with or immediately following a meal.   multivitamin tablet   MYRBETRIQ 25 MG Tb24 tablet Generic drug:  mirabegron ER   OXYGEN   rosuvastatin 5 MG tablet Commonly known as:  CRESTOR Take 1 tablet (5 mg total) by mouth at bedtime.   Vitamin D3 2000  units Tabs        SIGNIFICANT DIAGNOSTIC EXAMS  PREVIOUS:   11-09-16: chest x-ray: No active disease.  11-11-16: 2-d echo:  - Left ventricle: The cavity size was normal. There was moderate concentric hypertrophy. Systolic function was mildly reduced. The estimated ejection fraction was in the range of 45% to 50%.  Diffuse hypokinesis. Features are consistent with a pseudonormal  left ventricular filling pattern, with concomitant abnormal relaxation and increased filling pressure (grade 2 diastolic dysfunction). - Aortic valve: Transvalvular velocity was within the normal range. There was no stenosis. There was no regurgitation. - Mitral valve: Transvalvular velocity was within the normal range. There was no evidence for stenosis. There was trivial  regurgitation. - Left atrium: The atrium was severely dilated. - Right ventricle: The cavity size was normal. Wall thickness was normal. Systolic function was normal. - Atrial septum: No defect or patent foramen ovale was identified. - Tricuspid valve: There was mild regurgitation. - Pulmonary arteries: Systolic pressure was severely increased. PA peak pressure: 62 mm Hg (S).  11-12-16: cardiac cath: IMPRESSION:Mr. Pridgeon has normal coronary arteries and mildly elevated LVEDP. I believe that his troponin elevation was related to demand ischemia from A. Fib with RVR. Continue medical therapy will be recommended.The sheath was removed and pressure will be held on the right groin to achieve hemostasis. The patient left the lab in stable condition. Because of his size and going to put him on Unit 6C for  closer groin observation.   11-14-16: chest x-ray: Cardiomegaly with vascular congestion and basilar atelectasis. Lower lung volumes.  NO NEW EXAMS  LABS REVIEWED: PREVIOUS:   11-09-16: wbc 13.2; hgb 14.9; hct 45.6; mcv 89.6; plt 233; glucose 167; bun 24; creat 2.11; k+ 4.0; na++ 140; ca 8.8; BNP 656.2 hgb a1c: 6.3 11-10-16: wbc 10.1; hgb 12.8;  hct 41.2; mcv 90.0; plt 145; glucose  95; bun 27; creat 1.69; k+ 3.6; na++ 138; ca 8.4 11-11-16: wbc 11.5; hgb 13.4; hct 42.0;mcv 90.1 plt 147 11-13-16: wbc 10.4;hgb 12.7; hct 40.0; mcv 89.5; plt 147; glucose 149; bun 19; creat 1.15; k+ 3.7; na++ 138; ca 8.7; chol 1442; ldl 77; trig 79; hdl 49  11-14-16: wbc 12.0; hgb 13.0; hct 41.2; mcv 88.8; plt 170; glucose  84; bun 21; creat 1.11; k+ 3.8; na++ 139; ca 8.6   NO NEW LABS   Review of Systems  Constitutional: Negative for malaise/fatigue.  Respiratory: Negative for cough and shortness of breath.   Cardiovascular: Negative for chest pain, palpitations and leg swelling.  Gastrointestinal: Negative for abdominal pain, constipation and heartburn.  Genitourinary:       Has stress incontinence   Musculoskeletal: Negative for back pain, joint pain and myalgias.  Skin: Negative.   Neurological: Negative for dizziness.  Psychiatric/Behavioral: The patient is not nervous/anxious.    Physical Exam  Constitutional: He is oriented to person, place, and time. He appears well-developed and well-nourished. No distress.  Morbid obesity   Neck: Neck supple. No thyromegaly present.  Cardiovascular: Normal rate, regular rhythm, normal heart sounds and intact distal pulses.  Pulmonary/Chest: Effort normal and breath sounds normal. No respiratory distress.  Abdominal: Soft. Bowel sounds are normal. He exhibits no distension. There is no tenderness.  Musculoskeletal: Normal range of motion. He exhibits no edema.  Lymphadenopathy:    He has no cervical adenopathy.  Neurological: He is alert and oriented to person, place, and time.  Skin: Skin is warm and dry. He is not diaphoretic.  Psychiatric: He has a normal mood and affect.   ASSESSMENT/ PLAN:  Patient is being discharged with the following home health services:  Pto/ot/rn to evaluate and treat as indicated for gait balance strength adl training and medication management   Patient is being discharged  with the following durable medical equipment:  Standard bariatric wheelchair to allow him to maintain his current level of independence with his adls which cannot be achieved with a walker and can self propel He will need a semi-electric bed due to his chronic diastolic heart failure which requires him to have the head of his bed up at least 30 degrees to prevent excessive shortness of breath; which cannot be achieved with a a standard bed 3:1 commode  Patient has been advised to f/u with their PCP in 1-2 weeks to bring them up to date on their rehab stay.  Social services at facility was responsible for arranging this appointment.  Pt was provided with a 30 day supply of prescriptions for medications and refills must be obtained from their PCP.  For controlled substances, a more limited supply may be provided adequate until PCP appointment only.  He has written a 30 day supply of his medications per the medication list as above.   Time spent with patient and discharge process: 45 minutes: discussed needed dme; home health needs and expectations and medication regimen. Verbalized understanding.    Ok Edwards NP [facility]  Contact [phone] Monday through Friday 8am- 5pm  After hours call [phone]

## 2016-11-29 DIAGNOSIS — G4733 Obstructive sleep apnea (adult) (pediatric): Secondary | ICD-10-CM | POA: Diagnosis not present

## 2016-12-03 ENCOUNTER — Encounter: Payer: Self-pay | Admitting: Internal Medicine

## 2016-12-03 DIAGNOSIS — I248 Other forms of acute ischemic heart disease: Secondary | ICD-10-CM | POA: Insufficient documentation

## 2016-12-03 DIAGNOSIS — I480 Paroxysmal atrial fibrillation: Secondary | ICD-10-CM | POA: Insufficient documentation

## 2016-12-04 ENCOUNTER — Telehealth: Payer: Self-pay | Admitting: Physician Assistant

## 2016-12-04 DIAGNOSIS — R531 Weakness: Secondary | ICD-10-CM | POA: Diagnosis not present

## 2016-12-04 DIAGNOSIS — I5032 Chronic diastolic (congestive) heart failure: Secondary | ICD-10-CM | POA: Diagnosis not present

## 2016-12-04 DIAGNOSIS — I110 Hypertensive heart disease with heart failure: Secondary | ICD-10-CM | POA: Diagnosis not present

## 2016-12-04 DIAGNOSIS — R69 Illness, unspecified: Secondary | ICD-10-CM | POA: Diagnosis not present

## 2016-12-04 DIAGNOSIS — E669 Obesity, unspecified: Secondary | ICD-10-CM | POA: Diagnosis not present

## 2016-12-04 DIAGNOSIS — Z9181 History of falling: Secondary | ICD-10-CM | POA: Diagnosis not present

## 2016-12-04 DIAGNOSIS — R269 Unspecified abnormalities of gait and mobility: Secondary | ICD-10-CM | POA: Diagnosis not present

## 2016-12-04 NOTE — Telephone Encounter (Signed)
Records received from [facility] gave to [facility].

## 2016-12-04 NOTE — Progress Notes (Deleted)
Cardiology Office Note    Date:  12/04/2016   ID:  Justin Tate, DOB 07-03-1945, MRN [id number]  PCP:  Justin Cruel, MD  Cardiologist:  Dr. Acie Fredrickson  Chief Complaint: Hospital follow up s/p   cath  History of Present Illness:   Justin Tate is a 71 y.o. male HTN, HLD, OSA s/p ablation L greater SV & improved>>now is going back on it, venous stasis ulcers w/ deep venous reflux, LBBB, OA, morbid obesity presents for hospital follow up.   Admitted 10/2016 for shortness of breath, nausea and weakness. He was found to be in atrial fib with RVR. Appeared to have had a NSTEMI - I suspect Type 1, rather than type 2. Started on amiodarone and heparin. convered to sinus. Normal TSH. Echo showed mildly reduced systolic function. Left heart catheterization showed normal coronary arteries. Recommended new CPAP machine. LVEDP was mildly elevated and he his mildly volume overloaded on exam. Started lasix 20mg  daily.   Here today for follow up.           Past Medical History:  Diagnosis Date  . Acute on chronic diastolic CHF (congestive heart failure), NYHA class 4 ([place]) 11/09/2016  . AKI (acute kidney injury) ([place]) 11/09/2016  . Anemia   . Arthritis   . Cataracts, bilateral   . Chronic diastolic CHF (congestive heart failure), NYHA class 3 ([place]) 11/09/2016  . Colon polyps   . Depression   . Dyspnea   . Essential hypertension 11/04/2012  . Hyperlipidemia   . Hypertension   . NSTEMI (non-ST elevated myocardial infarction) ([place]) 11/09/2016  . Pneumonia    hx  . Sleep apnea    dx 25 yrs ago used cpap 12 yrs or so-  not used 6-7 yrs  . Varicose veins     Past Surgical History:  Procedure Laterality Date  . ENDOSCOPIC VEIN LASER TREATMENT    . KNEE ARTHROSCOPY Right 07/11/2016  . TONSILLECTOMY      Current Medications: Prior to Admission medications   Medication Sig Start Date End Date Taking? Authorizing Provider  acetaminophen (TYLENOL) 325 MG tablet Take 650 mg by  mouth every 6 (six) hours as needed.    [provider]  amiodarone (PACERONE) 200 MG tablet Take 1 tablet (200 mg total) by mouth daily. 11/13/16   Daune Perch, NP  apixaban (ELIQUIS) 5 MG TABS tablet Take 1 tablet (5 mg total) by mouth 2 (two) times daily. 11/13/16   Daune Perch, NP  aspirin EC 81 MG tablet Take 81 mg by mouth daily.    [provider]  buPROPion (WELLBUTRIN XL) 300 MG 24 hr tablet Take 300 mg by mouth daily.    [provider]  Cholecalciferol (VITAMIN D3) 2000 UNITS TABS Take 2,000 Units by mouth daily.     [provider]  docusate sodium (COLACE) 100 MG capsule Take 100 mg by mouth daily as needed for mild constipation.    [provider]  ferrous sulfate 325 (65 FE) MG tablet Take 325 mg by mouth daily with breakfast.    [provider]  furosemide (LASIX) 20 MG tablet Take 1 tablet (20 mg total) by mouth daily. 11/13/16   Daune Perch, NP  losartan (COZAAR) 25 MG tablet Take 1 tablet (25 mg total) by mouth daily. 11/14/16   Daune Perch, NP  meloxicam (MOBIC) 15 MG tablet Take 15 mg by mouth daily. with food 10/03/16   [provider]  metoprolol succinate (TOPROL-XL) 50 MG 24  hr tablet Take 1 tablet (50 mg total) by mouth daily. Take with or immediately following a meal. 11/14/16   Daune Perch, NP  mirabegron ER (MYRBETRIQ) 25 MG TB24 tablet Take 25 mg by mouth daily.    [provider]  Multiple Vitamin (MULTIVITAMIN) tablet Take 1 tablet by mouth daily.    [provider]  OXYGEN 2lpm via nasal cannula at night to maintain o2 greater than 90%    [provider]  rosuvastatin (CRESTOR) 5 MG tablet Take 1 tablet (5 mg total) by mouth at bedtime. 11/13/16 11/13/17  Daune Perch, NP    Allergies:   No known allergies   Social History   Socioeconomic History  . Marital status: Single    Spouse name: Not on file  . Number of children: Not on file  . Years of  education: Not on file  . Highest education level: Not on file  Social Needs  . Financial resource strain: Not on file  . Food insecurity - worry: Not on file  . Food insecurity - inability: Not on file  . Transportation needs - medical: Not on file  . Transportation needs - non-medical: Not on file  Occupational History  . Occupation: Retired  Tobacco Use  . Smoking status: Never Smoker  . Smokeless tobacco: Never Used  Substance and Sexual Activity  . Alcohol use: No  . Drug use: No  . Sexual activity: Not on file  Other Topics Concern  . Not on file  Social History Narrative   Pt lives alone in [place]. Does not have stairs.     Family History:  The patient's family history includes Cancer in his mother; Diabetes in his sister; Heart attack (age of onset: 46) in his father; Heart disease in his father; Nephrolithiasis in his father; Other in his mother. ***  ROS:   Please see the history of present illness.    ROS All other systems reviewed and are negative.   PHYSICAL EXAM:   VS:  There were no vitals taken for this visit.   GEN: Well nourished, well developed, in no acute distress  HEENT: normal  Neck: no JVD, carotid bruits, or masses Cardiac: ***RRR; no murmurs, rubs, or gallops,no edema  Respiratory:  clear to auscultation bilaterally, normal work of breathing GI: soft, nontender, nondistended, + BS MS: no deformity or atrophy  Skin: warm and dry, no rash Neuro:  Alert and Oriented x 3, Strength and sensation are intact Psych: euthymic mood, full affect  Wt Readings from Last 3 Encounters:  11/26/16 (!) 346 lb 3.2 oz (157 kg)  11/22/16 (!) 363 lb 14.4 oz (165.1 kg)  11/16/16 (!) 363 lb 14.4 oz (165.1 kg)      Studies/Labs Reviewed:   EKG:  EKG is ordered today.  The ekg ordered today demonstrates ***  Recent Labs: 11/09/2016: ALT 30; B Natriuretic Peptide 656.2 11/11/2016: TSH 1.980 11/12/2016: Magnesium 1.8 11/14/2016: BUN 21; Creatinine, Ser  1.11; Hemoglobin 13.0; Platelets 170; Potassium 3.8; Sodium 139   Lipid Panel    Component Value Date/Time   CHOL 142 11/13/2016 0832   TRIG 79 11/13/2016 0832   HDL 49 11/13/2016 0832   CHOLHDL 2.9 11/13/2016 0832   VLDL 16 11/13/2016 0832   LDLCALC 77 11/13/2016 0832    Additional studies/ records that were reviewed today include:   Left heart cath 11/12/2016 IMPRESSION:Mr. Brallier has normal coronary arteries and mildly elevated LVEDP. I believe that his troponin elevation was related to demand  ischemia from A. Fib with RVR. Continue medical therapy will be recommended.The sheath was removed and pressure will be held on the right groin to achieve hemostasis. The patient left the lab in stable condition. Because of his size and going to put him on Unit 6C for closer groin observation. _____________  Echocardiogram 11/11/2016 Study Conclusions - Left ventricle: The cavity size was normal. There was moderate concentric hypertrophy. Systolic function was mildly reduced. The estimated ejection fraction was in the range of 45% to 50%. Diffuse hypokinesis. Features are consistent with a pseudonormal left ventricular filling pattern, with concomitant abnormal relaxation and increased filling pressure (grade 2 diastolic dysfunction). - Aortic valve: Transvalvular velocity was within the normal range. There was no stenosis. There was no regurgitation. - Mitral valve: Transvalvular velocity was within the normal range. There was no evidence for stenosis. There was trivial regurgitation. - Left atrium: The atrium was severely dilated. - Right ventricle: The cavity size was normal. Wall thickness was normal. Systolic function was normal. - Atrial septum: No defect or patent foramen ovale was identified. - Tricuspid valve: There was mild regurgitation. - Pulmonary arteries: Systolic pressure was severely increased. PA peak pressure: 62 mm Hg (S).  Impressions: -  Echo from 06/2016 was reviewed. Endomyocardial border definition was poor in that study and echo contrast was not used. Therefore it is challenging to determine whether there may have been some hypokinesis of the inferolateral wall in that study as well.    ASSESSMENT & PLAN:    1. PAF  2. Chronic combined CHF  3. HTN  4. HLD - 11/13/2016: Cholesterol 142; HDL 49; LDL Cholesterol 77; Triglycerides 79; VLDL 16    Medication Adjustments/Labs and Tests Ordered: Current medicines are reviewed at length with the patient today.  Concerns regarding medicines are outlined above.  Medication changes, Labs and Tests ordered today are listed in the Patient Instructions below. There are no Patient Instructions on file for this visit.   Jarrett Soho, [place]  12/04/2016 8:11 AM    [address], [place], [place]  [address] Phone: [phone]; Fax: [phone]

## 2016-12-05 ENCOUNTER — Telehealth: Payer: Self-pay

## 2016-12-05 ENCOUNTER — Ambulatory Visit: Payer: Medicare HMO | Admitting: Physician Assistant

## 2016-12-05 NOTE — Telephone Encounter (Signed)
NOTES FAXED TO NL °

## 2016-12-06 ENCOUNTER — Encounter: Payer: Self-pay | Admitting: Physician Assistant

## 2016-12-06 ENCOUNTER — Ambulatory Visit: Payer: Medicare HMO | Admitting: Physician Assistant

## 2016-12-06 VITALS — BP 117/69 | HR 66 | Ht 69.0 in | Wt 370.0 lb

## 2016-12-06 DIAGNOSIS — I10 Essential (primary) hypertension: Secondary | ICD-10-CM | POA: Diagnosis not present

## 2016-12-06 DIAGNOSIS — Z7901 Long term (current) use of anticoagulants: Secondary | ICD-10-CM

## 2016-12-06 DIAGNOSIS — Z79899 Other long term (current) drug therapy: Secondary | ICD-10-CM | POA: Diagnosis not present

## 2016-12-06 DIAGNOSIS — I480 Paroxysmal atrial fibrillation: Secondary | ICD-10-CM

## 2016-12-06 DIAGNOSIS — E785 Hyperlipidemia, unspecified: Secondary | ICD-10-CM

## 2016-12-06 DIAGNOSIS — I5032 Chronic diastolic (congestive) heart failure: Secondary | ICD-10-CM | POA: Diagnosis not present

## 2016-12-06 DIAGNOSIS — R600 Localized edema: Secondary | ICD-10-CM

## 2016-12-06 NOTE — Patient Instructions (Addendum)
Your physician has recommended you make the following change in your medication:  -- STOP aspirin  Your physician recommends that you return for lab work TODAY - CBC, BMET  Your physician recommends that you schedule a follow-up appointment in: 2 months with Dr. Debara Pickett

## 2016-12-06 NOTE — Progress Notes (Signed)
Cardiology Office Note    Date:  12/08/2016   ID:  Justin Tate, DOB [birth year], MRN [id number]  PCP:  Lawerance Cruel, MD  Cardiologist: Dr. Debara Pickett (previously Dr. Acie Fredrickson, but per patient, it was too difficult to get to [facility])   Chief Complaint  Patient presents with  . Follow-up    seen for Dr. Debara Pickett    History of Present Illness:  Justin Tate is a 71 y.o. male with PMH of HTN, HLD, OSA not on CPAP, and chronic diastolic HF. He was last seen by Cecilie Kicks NP for preoperative evaluation. Echocardiogram obtained on 07/10/2016 showed EF 60-65%, grade 2 DD, mildly dilated biatrial chamber. He underwent right knee surgery in June. More recently, he presented to the hospital with chest discomfort and that noted to be in atrial fibrillation with RVR on 11/09/2016. This atrial fibrillation is new to him. tSH was normal. He was placed on amiodarone and eliquis. Initial troponin was mildly elevated, however subsequent troponin went up to 3.77. He also had acute on chronic renal insufficiency. Echocardiogram obtained on 11/11/2016 showed EF 45-50%, diffuse hypokinesis, grade 2 DD, severely dilated left atrium, PA peak pressure 62 mmHg, mild TR. He eventually underwent cardiac catheterization on 11/12/2016 which showed normal coronary arteries, mildly elevated LVEDP. His troponin elevation was felt to be due to demand ischemia instead. Since his discharge, he did go back to the ED on 11/14/2013 with weakness. He had right lower extremity erythema,she will he was treated with one week of Augmentin for potential cellulitis.  Amiodarone was planned for a short therapy for 8 weeks and potentially discontinue that that to see if he can remain in sinus rhythm by his home. He presents today for cardiology office visit. He denies any recurrent palpitation or chest discomfort since his discharge. He does have lower extremity pitting edema on physical exam, however he says this current level of edema  is usual for him. I instructed him to take an additional dose of Lasix on a as needed basis for swelling. He has not noticed significant bleeding while on eliquis, however for some reason he was discharged on aspirin. Given normal cardiac catheterization recently, I will discontinue the aspirin. He will need to discuss with his orthopedic physician regarding alternative therapy to Mobic. His lung is clear. I plan to obtain CBC, basic metabolic panel today. He will need to see Dr. Debara Pickett in 2 months. I will defer to Dr. Debara Pickett to decide whether or not to discontinue amiodarone at that time.  Patient wished to establish with Dr. Debara Pickett, he was seen once by Dr. Acie Fredrickson before, however he says getting to the [facility] office from the parking lot was too much for him. He wished to come to [facility] instead. He is morbidly obese and has to walk around slowly with a walker.   Past Medical History:  Diagnosis Date  . Acute on chronic diastolic CHF (congestive heart failure), NYHA class 4 ([place]) 11/09/2016  . AKI (acute kidney injury) ([place]) 11/09/2016  . Anemia   . Arthritis   . Cataracts, bilateral   . Chronic diastolic CHF (congestive heart failure), NYHA class 3 ([place]) 11/09/2016  . Colon polyps   . Depression   . Dyspnea   . Essential hypertension 11/04/2012  . Hyperlipidemia   . Hypertension   . NSTEMI (non-ST elevated myocardial infarction) ([place]) 11/09/2016  . Pneumonia    hx  . Sleep apnea    dx 25 yrs ago used cpap  12 yrs or so-  not used 6-7 yrs  . Varicose veins     Past Surgical History:  Procedure Laterality Date  . ARTHROSCOPY KNEE Right 07/11/2016   Performed by Frederik Pear, MD at [place]  . DEBRIDEMENT OF Chondromalacia Right 07/11/2016   Performed by Frederik Pear, MD at [facility]    . KNEE ARTHROSCOPY Right 07/11/2016  . LEFT HEART CATH AND CORONARY ANGIOGRAPHY N/A 11/12/2016   Performed by Lorretta Harp, MD at [facility]  . PARTIAL MEDIAL  MENISECTOMY Right 07/11/2016   Performed by Frederik Pear, MD at [facility]  . TONSILLECTOMY      Current Medications: Outpatient Medications Prior to Visit  Medication Sig Dispense Refill  . acetaminophen (TYLENOL) 325 MG tablet Take 650 mg by mouth every 6 (six) hours as needed.    Marland Kitchen amiodarone (PACERONE) 200 MG tablet Take 1 tablet (200 mg total) by mouth daily. 30 tablet 5  . amoxicillin-clavulanate (AUGMENTIN) 875-125 MG tablet Take 1 tablet daily as needed by mouth.    Marland Kitchen apixaban (ELIQUIS) 5 MG TABS tablet Take 1 tablet (5 mg total) by mouth 2 (two) times daily. 60 tablet 0  . buPROPion (WELLBUTRIN XL) 300 MG 24 hr tablet Take 300 mg by mouth daily.    . Cholecalciferol (VITAMIN D3) 2000 UNITS TABS Take 2,000 Units by mouth daily.     . ferrous sulfate 325 (65 FE) MG tablet Take 325 mg by mouth daily with breakfast.    . furosemide (LASIX) 20 MG tablet Take 1 tablet (20 mg total) by mouth daily. 90 tablet 2  . losartan (COZAAR) 25 MG tablet Take 1 tablet (25 mg total) by mouth daily. 90 tablet 2  . meloxicam (MOBIC) 15 MG tablet Take 15 mg by mouth daily. with food  1  . metoprolol succinate (TOPROL-XL) 50 MG 24 hr tablet Take 1 tablet (50 mg total) by mouth daily. Take with or immediately following a meal. 90 tablet 2  . mirabegron ER (MYRBETRIQ) 25 MG TB24 tablet Take 25 mg by mouth daily.    . Multiple Vitamin (MULTIVITAMIN) tablet Take 1 tablet by mouth daily.    . NON FORMULARY at bedtime.    . rosuvastatin (CRESTOR) 5 MG tablet Take 1 tablet (5 mg total) by mouth at bedtime. 30 tablet 11  . aspirin EC 81 MG tablet Take 81 mg by mouth daily.    . OXYGEN 2lpm via nasal cannula at night to maintain o2 greater than 90%    . docusate sodium (COLACE) 100 MG capsule Take 100 mg by mouth daily as needed for mild constipation.     No facility-administered medications prior to visit.      Allergies:   No known allergies   Social History   Socioeconomic History  . Marital status: Single      Spouse name: None  . Number of children: None  . Years of education: None  . Highest education level: None  Social Needs  . Financial resource strain: None  . Food insecurity - worry: None  . Food insecurity - inability: None  . Transportation needs - medical: None  . Transportation needs - non-medical: None  Occupational History  . Occupation: Retired  Tobacco Use  . Smoking status: Never Smoker  . Smokeless tobacco: Never Used  Substance and Sexual Activity  . Alcohol use: No  . Drug use: No  . Sexual activity: None  Other  Topics Concern  . None  Social History Narrative   Pt lives alone in [place]. Does not have stairs.     Family History:  The patient's family history includes Cancer in his mother; Diabetes in his sister; Heart attack (age of onset: 22) in his father; Heart disease in his father; Nephrolithiasis in his father; Other in his mother.   ROS:   Please see the history of present illness.    ROS All other systems reviewed and are negative.   PHYSICAL EXAM:   VS:  BP 117/69   Pulse 66   Ht 5\' 9"  (1.753 m)   Wt (!) 370 lb (167.8 kg)   BMI 54.64 kg/m    GEN: morbidly obese   HEENT: normal  Neck: no JVD, carotid bruits, or masses Cardiac: RRR; no murmurs, rubs, or gallops  3+ pitting edema in bilateral LE Respiratory:  clear to auscultation bilaterally, normal work of breathing GI: soft, nontender, nondistended, + BS MS: no deformity or atrophy  Skin: warm and dry, no rash Neuro:  Alert and Oriented x 3, Strength and sensation are intact Psych: euthymic mood, full affect  Wt Readings from Last 3 Encounters:  12/06/16 (!) 370 lb (167.8 kg)  11/26/16 (!) 346 lb 3.2 oz (157 kg)  11/22/16 (!) 363 lb 14.4 oz (165.1 kg)      Studies/Labs Reviewed:   EKG:  EKG is ordered today.  The ekg ordered today demonstrates normal sinus rhythm, left bundle branch block.  Recent Labs: 11/09/2016: ALT 30; B Natriuretic Peptide 656.2 11/11/2016: TSH  1.980 11/12/2016: Magnesium 1.8 11/14/2016: BUN 21; Creatinine, Ser 1.11; Hemoglobin 13.0; Platelets 170; Potassium 3.8; Sodium 139   Lipid Panel    Component Value Date/Time   CHOL 142 11/13/2016 0832   TRIG 79 11/13/2016 0832   HDL 49 11/13/2016 0832   CHOLHDL 2.9 11/13/2016 0832   VLDL 16 11/13/2016 0832   LDLCALC 77 11/13/2016 0832    Additional studies/ records that were reviewed today include:   Echo 11/11/2016 LV EF: 45% -   50%  ------------------------------------------------------------------- Indications:      Chest pain 786.51.  ------------------------------------------------------------------- History:   PMH:   Congestive heart failure.  PMH:  NSTEMI. Acute Kidney Injury.  Risk factors:  Hypertension. Dyslipidemia.  ------------------------------------------------------------------- Study Conclusions  - Left ventricle: The cavity size was normal. There was moderate   concentric hypertrophy. Systolic function was mildly reduced. The   estimated ejection fraction was in the range of 45% to 50%.   Diffuse hypokinesis. Features are consistent with a pseudonormal   left ventricular filling pattern, with concomitant abnormal   relaxation and increased filling pressure (grade 2 diastolic   dysfunction). - Aortic valve: Transvalvular velocity was within the normal range.   There was no stenosis. There was no regurgitation. - Mitral valve: Transvalvular velocity was within the normal range.   There was no evidence for stenosis. There was trivial   regurgitation. - Left atrium: The atrium was severely dilated. - Right ventricle: The cavity size was normal. Wall thickness was   normal. Systolic function was normal. - Atrial septum: No defect or patent foramen ovale was identified. - Tricuspid valve: There was mild regurgitation. - Pulmonary arteries: Systolic pressure was severely increased. PA   peak pressure: 62 mm Hg (S).  Impressions:  - Echo from  06/2016 was reviewed. Endomyocardial border definition   was poor in that study and echo contrast was not used. Therefore   it is challenging to determine  whether there may have been some   hypokinesis of the inferolateral wall in that study as well.    Cath 11/12/2016 IMPRESSION:Justin Tate has normal coronary arteries and mildly elevated LVEDP. I believe that his troponin elevation was related to demand ischemia from A. Fib with RVR.  ASSESSMENT:    1. PAF (paroxysmal atrial fibrillation) ([place])   2. On anticoagulant therapy   3. Medication management   4. Essential hypertension   5. Hyperlipidemia, unspecified hyperlipidemia type   6. Chronic diastolic heart failure ([place])   7. Bilateral lower extremity edema      PLAN:  In order of problems listed above:  1. Paroxysmal atrial fibrillation: currently on eliquis and amiodarone. The plan is to continue amiodarone for 8 weeks, then consider stopping amiodarone and obtain a 30 day event monitor. If no recurrence of atrial fibrillation, may consider stopping eliquis. He is also on aspirin despite recent negative cath, given the need for eliquis, will discontinue aspirin. Given eliquis, obtain CBC today  2. Chronic bilateral lower extremity edema: He says lower extremity edema has not changed, he has at least 3+ bilaterally. This is likely related to his morbid obesity and chronic dependent position  3. Hypertension: blood pressure well-controlled, recently placed on Toprol-XL and losartan given mild LV dysfunction noted on echocardiogram. Will obtain basic metabolic panel  4. Hyperlipidemia: on Crestor 5 mg daily. Recent lipid panel showed cholesterol 142, triglycerides 79, HDL 49, LDL 77.     Medication Adjustments/Labs and Tests Ordered: Current medicines are reviewed at length with the patient today.  Concerns regarding medicines are outlined above.  Medication changes, Labs and Tests ordered today are listed in the Patient  Instructions below. Patient Instructions  Your physician has recommended you make the following change in your medication:  -- STOP aspirin  Your physician recommends that you return for lab work TODAY - [place], BMET  Your physician recommends that you schedule a follow-up appointment in: 2 months with Dr. Debara Pickett       Signed, Almyra Deforest, [place]  12/08/2016 8:52 AM    [address] [facility], [place], [place]  [address] Phone: [phone]; Fax: [phone]

## 2016-12-07 DIAGNOSIS — Z79899 Other long term (current) drug therapy: Secondary | ICD-10-CM | POA: Diagnosis not present

## 2016-12-07 DIAGNOSIS — M2550 Pain in unspecified joint: Secondary | ICD-10-CM | POA: Diagnosis not present

## 2016-12-07 DIAGNOSIS — R000 Tachycardia, unspecified: Secondary | ICD-10-CM | POA: Diagnosis not present

## 2016-12-07 DIAGNOSIS — R899 Unspecified abnormal finding in specimens from other organs, systems and tissues: Secondary | ICD-10-CM | POA: Diagnosis not present

## 2016-12-07 DIAGNOSIS — R609 Edema, unspecified: Secondary | ICD-10-CM | POA: Diagnosis not present

## 2016-12-08 ENCOUNTER — Encounter: Payer: Self-pay | Admitting: Physician Assistant

## 2016-12-11 ENCOUNTER — Telehealth (HOSPITAL_COMMUNITY): Payer: Self-pay

## 2016-12-11 NOTE — Telephone Encounter (Signed)
Called and spoke with patient in regards to Cardiac Rehab - He is unsure about the program due to his mobility. He is afraid he will not succeed in program. Patient is going to come take a look at the facility next week and speak with me in person.

## 2016-12-18 ENCOUNTER — Telehealth (HOSPITAL_COMMUNITY): Payer: Self-pay

## 2016-12-18 NOTE — Telephone Encounter (Signed)
Called to follow up with patient in regards to Cardiac Rehab - Patient stated that he had a lot come up and he will come by the facility on the 5th of December.

## 2016-12-24 DIAGNOSIS — H524 Presbyopia: Secondary | ICD-10-CM | POA: Diagnosis not present

## 2016-12-24 DIAGNOSIS — H5213 Myopia, bilateral: Secondary | ICD-10-CM | POA: Diagnosis not present

## 2016-12-24 DIAGNOSIS — D3132 Benign neoplasm of left choroid: Secondary | ICD-10-CM | POA: Diagnosis not present

## 2016-12-24 DIAGNOSIS — H2513 Age-related nuclear cataract, bilateral: Secondary | ICD-10-CM | POA: Diagnosis not present

## 2016-12-27 ENCOUNTER — Telehealth (HOSPITAL_COMMUNITY): Payer: Self-pay

## 2016-12-27 NOTE — Telephone Encounter (Signed)
Called to follow up with patient in regards to Cardiac Rehab - Patient was in mobile with his right ankle due to an infection. Patient stated he will stop by next Wednesday if mobile at 1:15pm.

## 2016-12-28 DIAGNOSIS — G4733 Obstructive sleep apnea (adult) (pediatric): Secondary | ICD-10-CM | POA: Diagnosis not present

## 2016-12-28 DIAGNOSIS — I5032 Chronic diastolic (congestive) heart failure: Secondary | ICD-10-CM | POA: Diagnosis not present

## 2016-12-28 DIAGNOSIS — R531 Weakness: Secondary | ICD-10-CM | POA: Diagnosis not present

## 2016-12-29 DIAGNOSIS — G4733 Obstructive sleep apnea (adult) (pediatric): Secondary | ICD-10-CM | POA: Diagnosis not present

## 2017-01-08 ENCOUNTER — Telehealth (HOSPITAL_COMMUNITY): Payer: Self-pay

## 2017-01-08 NOTE — Telephone Encounter (Signed)
Called to follow up with patient in regards to Cardiac Rehab - Patient stated due to the snow he was unable to make it by the facility. Plans to stop by tomorrow.

## 2017-01-10 ENCOUNTER — Telehealth (HOSPITAL_COMMUNITY): Payer: Self-pay

## 2017-01-10 NOTE — Telephone Encounter (Signed)
Attempted to call patient to follow up in regards to Cardiac Rehab - Lm on Vm °

## 2017-01-17 ENCOUNTER — Telehealth (HOSPITAL_COMMUNITY): Payer: Self-pay

## 2017-01-17 NOTE — Telephone Encounter (Signed)
Called to follow up with patient in regards to Cardiac Rehab - Patient stated that he is currently receiving at home physical therapy for two more weeks. Will call in 2 weeks.

## 2017-01-28 DIAGNOSIS — R531 Weakness: Secondary | ICD-10-CM | POA: Diagnosis not present

## 2017-01-28 DIAGNOSIS — I5032 Chronic diastolic (congestive) heart failure: Secondary | ICD-10-CM | POA: Diagnosis not present

## 2017-01-28 DIAGNOSIS — G4733 Obstructive sleep apnea (adult) (pediatric): Secondary | ICD-10-CM | POA: Diagnosis not present

## 2017-01-29 DIAGNOSIS — G4733 Obstructive sleep apnea (adult) (pediatric): Secondary | ICD-10-CM | POA: Diagnosis not present

## 2017-02-01 DIAGNOSIS — R69 Illness, unspecified: Secondary | ICD-10-CM | POA: Diagnosis not present

## 2017-02-01 DIAGNOSIS — N183 Chronic kidney disease, stage 3 (moderate): Secondary | ICD-10-CM | POA: Diagnosis not present

## 2017-02-01 DIAGNOSIS — Z0000 Encounter for general adult medical examination without abnormal findings: Secondary | ICD-10-CM | POA: Diagnosis not present

## 2017-02-01 DIAGNOSIS — I2510 Atherosclerotic heart disease of native coronary artery without angina pectoris: Secondary | ICD-10-CM | POA: Diagnosis not present

## 2017-02-01 DIAGNOSIS — I5032 Chronic diastolic (congestive) heart failure: Secondary | ICD-10-CM | POA: Diagnosis not present

## 2017-02-01 DIAGNOSIS — Z79899 Other long term (current) drug therapy: Secondary | ICD-10-CM | POA: Diagnosis not present

## 2017-02-01 DIAGNOSIS — E7800 Pure hypercholesterolemia, unspecified: Secondary | ICD-10-CM | POA: Diagnosis not present

## 2017-02-01 DIAGNOSIS — I10 Essential (primary) hypertension: Secondary | ICD-10-CM | POA: Diagnosis not present

## 2017-02-01 DIAGNOSIS — R531 Weakness: Secondary | ICD-10-CM | POA: Diagnosis not present

## 2017-02-01 DIAGNOSIS — G4733 Obstructive sleep apnea (adult) (pediatric): Secondary | ICD-10-CM | POA: Diagnosis not present

## 2017-02-01 DIAGNOSIS — Z1389 Encounter for screening for other disorder: Secondary | ICD-10-CM | POA: Diagnosis not present

## 2017-02-02 DIAGNOSIS — I110 Hypertensive heart disease with heart failure: Secondary | ICD-10-CM | POA: Diagnosis not present

## 2017-02-02 DIAGNOSIS — I5032 Chronic diastolic (congestive) heart failure: Secondary | ICD-10-CM | POA: Diagnosis not present

## 2017-02-02 DIAGNOSIS — E669 Obesity, unspecified: Secondary | ICD-10-CM | POA: Diagnosis not present

## 2017-02-02 DIAGNOSIS — R269 Unspecified abnormalities of gait and mobility: Secondary | ICD-10-CM | POA: Diagnosis not present

## 2017-02-02 DIAGNOSIS — Z9181 History of falling: Secondary | ICD-10-CM | POA: Diagnosis not present

## 2017-02-02 DIAGNOSIS — R69 Illness, unspecified: Secondary | ICD-10-CM | POA: Diagnosis not present

## 2017-02-02 DIAGNOSIS — R531 Weakness: Secondary | ICD-10-CM | POA: Diagnosis not present

## 2017-02-05 DIAGNOSIS — G4733 Obstructive sleep apnea (adult) (pediatric): Secondary | ICD-10-CM | POA: Diagnosis not present

## 2017-02-08 DIAGNOSIS — Z9181 History of falling: Secondary | ICD-10-CM | POA: Diagnosis not present

## 2017-02-08 DIAGNOSIS — E669 Obesity, unspecified: Secondary | ICD-10-CM | POA: Diagnosis not present

## 2017-02-08 DIAGNOSIS — I5032 Chronic diastolic (congestive) heart failure: Secondary | ICD-10-CM | POA: Diagnosis not present

## 2017-02-08 DIAGNOSIS — I110 Hypertensive heart disease with heart failure: Secondary | ICD-10-CM | POA: Diagnosis not present

## 2017-02-08 DIAGNOSIS — R69 Illness, unspecified: Secondary | ICD-10-CM | POA: Diagnosis not present

## 2017-02-08 DIAGNOSIS — R531 Weakness: Secondary | ICD-10-CM | POA: Diagnosis not present

## 2017-02-08 DIAGNOSIS — R269 Unspecified abnormalities of gait and mobility: Secondary | ICD-10-CM | POA: Diagnosis not present

## 2017-02-13 DIAGNOSIS — I5032 Chronic diastolic (congestive) heart failure: Secondary | ICD-10-CM | POA: Diagnosis not present

## 2017-02-13 DIAGNOSIS — R531 Weakness: Secondary | ICD-10-CM | POA: Diagnosis not present

## 2017-02-13 DIAGNOSIS — Z9181 History of falling: Secondary | ICD-10-CM | POA: Diagnosis not present

## 2017-02-13 DIAGNOSIS — E669 Obesity, unspecified: Secondary | ICD-10-CM | POA: Diagnosis not present

## 2017-02-13 DIAGNOSIS — I110 Hypertensive heart disease with heart failure: Secondary | ICD-10-CM | POA: Diagnosis not present

## 2017-02-13 DIAGNOSIS — R69 Illness, unspecified: Secondary | ICD-10-CM | POA: Diagnosis not present

## 2017-02-13 DIAGNOSIS — R269 Unspecified abnormalities of gait and mobility: Secondary | ICD-10-CM | POA: Diagnosis not present

## 2017-02-19 ENCOUNTER — Encounter: Payer: Self-pay | Admitting: Internal Medicine

## 2017-02-19 ENCOUNTER — Ambulatory Visit: Payer: Medicare HMO | Admitting: Internal Medicine

## 2017-02-19 VITALS — BP 138/86 | HR 68 | Ht 68.0 in | Wt 384.0 lb

## 2017-02-19 DIAGNOSIS — I480 Paroxysmal atrial fibrillation: Secondary | ICD-10-CM | POA: Diagnosis not present

## 2017-02-19 DIAGNOSIS — I5032 Chronic diastolic (congestive) heart failure: Secondary | ICD-10-CM | POA: Diagnosis not present

## 2017-02-19 DIAGNOSIS — I10 Essential (primary) hypertension: Secondary | ICD-10-CM

## 2017-02-19 NOTE — Progress Notes (Signed)
OFFICE NOTE  Chief Complaint:  Follow-up  Primary Care Physician: Lawerance Cruel, MD  HPI:  Justin Tate is a 72 y.o. male with a past medial history significant for HTN, HLD, OSA not on CPAP, and chronic diastolic HF. He was last seen by Cecilie Kicks NP for preoperative evaluation. Echocardiogram obtained on 07/10/2016 showed EF 60-65%, grade 2 DD, mildly dilated biatrial chamber. He underwent right knee surgery in June. More recently, he presented to the hospital with chest discomfort and that noted to be in atrial fibrillation with RVR on 11/09/2016. This atrial fibrillation is new to him. tSH was normal. He was placed on amiodarone and eliquis. Initial troponin was mildly elevated, however subsequent troponin went up to 3.77. He also had acute on chronic renal insufficiency. Echocardiogram obtained on 11/11/2016 showed EF 45-50%, diffuse hypokinesis, grade 2 DD, severely dilated left atrium, PA peak pressure 62 mmHg, mild TR. He eventually underwent cardiac catheterization on 11/12/2016 which showed normal coronary arteries, mildly elevated LVEDP. His troponin elevation was felt to be due to demand ischemia instead. Since his discharge, he did go back to the ED on 11/14/2013 with weakness. He had right lower extremity erythema,she will he was treated with one week of Augmentin for potential cellulitis.  Amiodarone was planned for a short therapy for 8 weeks and potentially discontinue that that to see if he can remain in sinus rhythm by his home. He presents today for cardiology office visit. He denies any recurrent palpitation or chest discomfort since his discharge. He does have lower extremity pitting edema on physical exam, however he says this current level of edema is usual for him. I instructed him to take an additional dose of Lasix on a as needed basis for swelling. He has not noticed significant bleeding while on eliquis, however for some reason he was discharged on aspirin. Given  normal cardiac catheterization recently, I will discontinue the aspirin. He will need to discuss with his orthopedic physician regarding alternative therapy to Mobic. His lung is clear. I plan to obtain CBC, basic metabolic panel today. He will need to see Dr. Debara Pickett in 2 months. I will defer to Dr. Debara Pickett to decide whether or not to discontinue amiodarone at that time.  02/19/2017  Justin Tate returns for follow-up with me today.  Overall he seems to be doing well.  He is using his CPAP regularly and reports improvement in his symptoms.  He denies chest pain or worsening shortness of breath.  He denies any recurrent palpitations or A. fib.  EKG shows sinus rhythm with first-degree AV block.  As discussed we will consider discontinuing his amiodarone.  He should remain on anticoagulation.  PMHx:  Past Medical History:  Diagnosis Date  . Acute on chronic diastolic CHF (congestive heart failure), NYHA class 4 ([place]) 11/09/2016  . AKI (acute kidney injury) ([place]) 11/09/2016  . Anemia   . Arthritis   . Cataracts, bilateral   . Chronic diastolic CHF (congestive heart failure), NYHA class 3 ([place]) 11/09/2016  . Colon polyps   . Depression   . Dyspnea   . Essential hypertension 11/04/2012  . Hyperlipidemia   . Hypertension   . NSTEMI (non-ST elevated myocardial infarction) ([place]) 11/09/2016  . Pneumonia    hx  . Sleep apnea    dx 25 yrs ago used cpap 12 yrs or so-  not used 6-7 yrs  . Varicose veins     Past Surgical History:  Procedure Laterality Date  . ENDOSCOPIC VEIN  LASER TREATMENT    . KNEE ARTHROSCOPY Right 07/11/2016  . KNEE ARTHROSCOPY Right 07/11/2016   Procedure: ARTHROSCOPY KNEE;  Surgeon: Frederik Pear, MD;  Location: [facility];  Service: Orthopedics;  Laterality: Right;  . LEFT HEART CATH AND CORONARY ANGIOGRAPHY N/A 11/12/2016   Procedure: LEFT HEART CATH AND CORONARY ANGIOGRAPHY;  Surgeon: Lorretta Harp, MD;  Location: [facility];  Service: Cardiovascular;  Laterality: N/A;   . MENISCUS DEBRIDEMENT Right 07/11/2016   Procedure: DEBRIDEMENT OF Chondromalacia;  Surgeon: Frederik Pear, MD;  Location: [facility];  Service: Orthopedics;  Laterality: Right;  . MENISECTOMY Right 07/11/2016   Procedure: PARTIAL MEDIAL MENISECTOMY;  Surgeon: Frederik Pear, MD;  Location: [facility];  Service: Orthopedics;  Laterality: Right;  . TONSILLECTOMY      FAMHx:  Family History  Problem Relation Age of Onset  . Cancer Mother        colon  . Other Mother        varicose veins  . Heart disease Father   . Heart attack Father 73  . Nephrolithiasis Father   . Diabetes Sister        type 1    SOCHx:   reports that  has never smoked. he has never used smokeless tobacco. He reports that he does not drink alcohol or use drugs.  ALLERGIES:  Allergies  Allergen Reactions  . No Known Allergies     ROS: Pertinent items noted in HPI and remainder of comprehensive ROS otherwise negative.  HOME MEDS: Current Outpatient Medications on File Prior to Visit  Medication Sig Dispense Refill  . acetaminophen (TYLENOL) 325 MG tablet Take 650 mg by mouth every 6 (six) hours as needed.    Marland Kitchen amoxicillin-clavulanate (AUGMENTIN) 875-125 MG tablet Take 1 tablet daily as needed by mouth.    Marland Kitchen apixaban (ELIQUIS) 5 MG TABS tablet Take 1 tablet (5 mg total) by mouth 2 (two) times daily. 60 tablet 0  . buPROPion (WELLBUTRIN XL) 300 MG 24 hr tablet Take 300 mg by mouth daily.    . Cholecalciferol (VITAMIN D3) 2000 UNITS TABS Take 2,000 Units by mouth daily.     . ferrous sulfate 325 (65 FE) MG tablet Take 325 mg by mouth daily with breakfast.    . furosemide (LASIX) 20 MG tablet Take 1 tablet (20 mg total) by mouth daily. 90 tablet 2  . losartan (COZAAR) 25 MG tablet Take 1 tablet (25 mg total) by mouth daily. 90 tablet 2  . meloxicam (MOBIC) 15 MG tablet Take 15 mg by mouth daily. with food  1  . metoprolol succinate (TOPROL-XL) 50 MG 24 hr tablet Take 1 tablet (50 mg total) by mouth daily. Take with or  immediately following a meal. 90 tablet 2  . mirabegron ER (MYRBETRIQ) 25 MG TB24 tablet Take 25 mg by mouth daily.    . Multiple Vitamin (MULTIVITAMIN) tablet Take 1 tablet by mouth daily.    . NON FORMULARY at bedtime.    . rosuvastatin (CRESTOR) 5 MG tablet Take 1 tablet (5 mg total) by mouth at bedtime. 30 tablet 11   No current facility-administered medications on file prior to visit.     LABS/IMAGING: No results found for this or any previous visit (from the past 48 hour(s)). No results found.  LIPID PANEL:    Component Value Date/Time   CHOL 142 11/13/2016 0832   TRIG 79 11/13/2016 0832   HDL 49 11/13/2016 0832   CHOLHDL 2.9 11/13/2016 3546  VLDL 16 11/13/2016 0832   LDLCALC 77 11/13/2016 0832     WEIGHTS: Wt Readings from Last 3 Encounters:  02/19/17 (!) 384 lb (174.2 kg)  12/06/16 (!) 370 lb (167.8 kg)  11/26/16 (!) 346 lb 3.2 oz (157 kg)    VITALS: BP 138/86   Pulse 68   Ht 5\' 8"  (1.727 m)   Wt (!) 384 lb (174.2 kg)   BMI 58.39 kg/m   EXAM: General appearance: alert, no distress and morbidly obese Neck: no carotid bruit, no JVD and thyroid not enlarged, symmetric, no tenderness/mass/nodules Lungs: clear to auscultation bilaterally Heart: regular rate and rhythm Abdomen: soft, non-tender; bowel sounds normal; no masses,  no organomegaly Extremities: edema 1+ pitting edema and venous stasis dermatitis noted Pulses: 2+ and symmetric Skin: Skin color, texture, turgor normal. No rashes or lesions Neurologic: Grossly normal Psych: Animated, poor focus  EKG: Sinus rhythm first-degree AV block at 68, LVH by voltage, possible lateral infarct- personally reviewed  ASSESSMENT: 1. Chronic diastolic congestive heart failure 2. Morbid obesity 3. Moderate to severe pulmonary hypertension 4. Paroxysmal atrial fibrillation-CHADSVASC score of 4 5. OSA on CPAP 6. History of an STEMI with normal coronaries (10/2016) 7. Venous insufficiency  PLAN: 1.   Justin Tate  seems to be doing better and is now on CPAP for sleep apnea which was severe.  Perhaps this is what triggered his A. fib.  He is not had recurrence.  I think we can stop his amiodarone but should continue Eliquis.  He needs to work on weight loss and significant dietary changes.  Follow-up 6 months.  Pixie Casino, MD, [facility], [place] Director of the [facility] Lipid Disorders &  Cardiovascular Risk Reduction Clinic Diplomate of the American Board of Clinical Lipidology Attending Cardiologist  Direct Dial: [phone]  Fax: [phone]  Website:  [email or web address] 02/19/2017, 5:53 PM

## 2017-02-19 NOTE — Patient Instructions (Signed)
Your physician has recommended you make the following change in your medication: STOP amiodarone  Your physician wants you to follow-up in: 6 months with Dr. Debara Pickett. You will receive a reminder letter in the mail two months in advance. If you don't receive a letter, please call our office to schedule the follow-up appointment.

## 2017-02-21 DIAGNOSIS — R269 Unspecified abnormalities of gait and mobility: Secondary | ICD-10-CM | POA: Diagnosis not present

## 2017-02-21 DIAGNOSIS — E669 Obesity, unspecified: Secondary | ICD-10-CM | POA: Diagnosis not present

## 2017-02-21 DIAGNOSIS — Z9181 History of falling: Secondary | ICD-10-CM | POA: Diagnosis not present

## 2017-02-21 DIAGNOSIS — R531 Weakness: Secondary | ICD-10-CM | POA: Diagnosis not present

## 2017-02-21 DIAGNOSIS — R69 Illness, unspecified: Secondary | ICD-10-CM | POA: Diagnosis not present

## 2017-02-21 DIAGNOSIS — I110 Hypertensive heart disease with heart failure: Secondary | ICD-10-CM | POA: Diagnosis not present

## 2017-02-21 DIAGNOSIS — I5032 Chronic diastolic (congestive) heart failure: Secondary | ICD-10-CM | POA: Diagnosis not present

## 2017-02-27 DIAGNOSIS — E669 Obesity, unspecified: Secondary | ICD-10-CM | POA: Diagnosis not present

## 2017-02-27 DIAGNOSIS — Z9181 History of falling: Secondary | ICD-10-CM | POA: Diagnosis not present

## 2017-02-27 DIAGNOSIS — I110 Hypertensive heart disease with heart failure: Secondary | ICD-10-CM | POA: Diagnosis not present

## 2017-02-27 DIAGNOSIS — I5032 Chronic diastolic (congestive) heart failure: Secondary | ICD-10-CM | POA: Diagnosis not present

## 2017-02-27 DIAGNOSIS — R69 Illness, unspecified: Secondary | ICD-10-CM | POA: Diagnosis not present

## 2017-02-27 DIAGNOSIS — R531 Weakness: Secondary | ICD-10-CM | POA: Diagnosis not present

## 2017-02-27 DIAGNOSIS — R269 Unspecified abnormalities of gait and mobility: Secondary | ICD-10-CM | POA: Diagnosis not present

## 2017-02-28 ENCOUNTER — Telehealth (HOSPITAL_COMMUNITY): Payer: Self-pay

## 2017-02-28 DIAGNOSIS — I5032 Chronic diastolic (congestive) heart failure: Secondary | ICD-10-CM | POA: Diagnosis not present

## 2017-02-28 DIAGNOSIS — R531 Weakness: Secondary | ICD-10-CM | POA: Diagnosis not present

## 2017-02-28 DIAGNOSIS — G4733 Obstructive sleep apnea (adult) (pediatric): Secondary | ICD-10-CM | POA: Diagnosis not present

## 2017-02-28 NOTE — Telephone Encounter (Signed)
Called to follow up with patient - Patient was just released from SNF and stated that he will try every effort to stop by the facility tomorrow. I explained to patient that if he does not stop by we will have to close referral. Patient was understanding.

## 2017-03-01 DIAGNOSIS — G4733 Obstructive sleep apnea (adult) (pediatric): Secondary | ICD-10-CM | POA: Diagnosis not present

## 2017-03-28 DIAGNOSIS — R531 Weakness: Secondary | ICD-10-CM | POA: Diagnosis not present

## 2017-03-28 DIAGNOSIS — I5032 Chronic diastolic (congestive) heart failure: Secondary | ICD-10-CM | POA: Diagnosis not present

## 2017-03-28 DIAGNOSIS — G4733 Obstructive sleep apnea (adult) (pediatric): Secondary | ICD-10-CM | POA: Diagnosis not present

## 2017-03-29 DIAGNOSIS — G4733 Obstructive sleep apnea (adult) (pediatric): Secondary | ICD-10-CM | POA: Diagnosis not present

## 2017-04-28 DIAGNOSIS — R531 Weakness: Secondary | ICD-10-CM | POA: Diagnosis not present

## 2017-04-28 DIAGNOSIS — G4733 Obstructive sleep apnea (adult) (pediatric): Secondary | ICD-10-CM | POA: Diagnosis not present

## 2017-04-28 DIAGNOSIS — I5032 Chronic diastolic (congestive) heart failure: Secondary | ICD-10-CM | POA: Diagnosis not present

## 2017-04-29 DIAGNOSIS — G4733 Obstructive sleep apnea (adult) (pediatric): Secondary | ICD-10-CM | POA: Diagnosis not present

## 2017-04-30 DIAGNOSIS — C61 Malignant neoplasm of prostate: Secondary | ICD-10-CM | POA: Diagnosis not present

## 2017-05-07 DIAGNOSIS — C61 Malignant neoplasm of prostate: Secondary | ICD-10-CM | POA: Diagnosis not present

## 2017-05-07 DIAGNOSIS — N401 Enlarged prostate with lower urinary tract symptoms: Secondary | ICD-10-CM | POA: Diagnosis not present

## 2017-05-07 DIAGNOSIS — R3915 Urgency of urination: Secondary | ICD-10-CM | POA: Diagnosis not present

## 2017-05-28 DIAGNOSIS — R531 Weakness: Secondary | ICD-10-CM | POA: Diagnosis not present

## 2017-05-28 DIAGNOSIS — G4733 Obstructive sleep apnea (adult) (pediatric): Secondary | ICD-10-CM | POA: Diagnosis not present

## 2017-05-28 DIAGNOSIS — I5032 Chronic diastolic (congestive) heart failure: Secondary | ICD-10-CM | POA: Diagnosis not present

## 2017-05-29 DIAGNOSIS — G4733 Obstructive sleep apnea (adult) (pediatric): Secondary | ICD-10-CM | POA: Diagnosis not present

## 2017-06-28 DIAGNOSIS — G4733 Obstructive sleep apnea (adult) (pediatric): Secondary | ICD-10-CM | POA: Diagnosis not present

## 2017-06-28 DIAGNOSIS — I5032 Chronic diastolic (congestive) heart failure: Secondary | ICD-10-CM | POA: Diagnosis not present

## 2017-06-28 DIAGNOSIS — R531 Weakness: Secondary | ICD-10-CM | POA: Diagnosis not present

## 2017-06-29 DIAGNOSIS — G4733 Obstructive sleep apnea (adult) (pediatric): Secondary | ICD-10-CM | POA: Diagnosis not present

## 2017-07-28 DIAGNOSIS — R531 Weakness: Secondary | ICD-10-CM | POA: Diagnosis not present

## 2017-07-28 DIAGNOSIS — G4733 Obstructive sleep apnea (adult) (pediatric): Secondary | ICD-10-CM | POA: Diagnosis not present

## 2017-07-28 DIAGNOSIS — I5032 Chronic diastolic (congestive) heart failure: Secondary | ICD-10-CM | POA: Diagnosis not present

## 2017-07-29 DIAGNOSIS — G4733 Obstructive sleep apnea (adult) (pediatric): Secondary | ICD-10-CM | POA: Diagnosis not present

## 2017-08-01 DIAGNOSIS — L84 Corns and callosities: Secondary | ICD-10-CM | POA: Diagnosis not present

## 2017-08-01 DIAGNOSIS — M79675 Pain in left toe(s): Secondary | ICD-10-CM | POA: Diagnosis not present

## 2017-08-01 DIAGNOSIS — I739 Peripheral vascular disease, unspecified: Secondary | ICD-10-CM | POA: Diagnosis not present

## 2017-08-01 DIAGNOSIS — L603 Nail dystrophy: Secondary | ICD-10-CM | POA: Diagnosis not present

## 2017-08-01 DIAGNOSIS — M79674 Pain in right toe(s): Secondary | ICD-10-CM | POA: Diagnosis not present

## 2017-08-19 ENCOUNTER — Encounter: Payer: Self-pay | Admitting: Internal Medicine

## 2017-08-19 ENCOUNTER — Ambulatory Visit (INDEPENDENT_AMBULATORY_CARE_PROVIDER_SITE_OTHER): Payer: Medicare HMO | Admitting: Internal Medicine

## 2017-08-19 VITALS — BP 118/72 | HR 69 | Ht 68.0 in | Wt 382.0 lb

## 2017-08-19 DIAGNOSIS — I447 Left bundle-branch block, unspecified: Secondary | ICD-10-CM | POA: Diagnosis not present

## 2017-08-19 DIAGNOSIS — I480 Paroxysmal atrial fibrillation: Secondary | ICD-10-CM

## 2017-08-19 DIAGNOSIS — I5032 Chronic diastolic (congestive) heart failure: Secondary | ICD-10-CM | POA: Diagnosis not present

## 2017-08-19 MED ORDER — APIXABAN 5 MG PO TABS: 5.0000 mg | ORAL_TABLET | Freq: Two times a day (BID) | ORAL | 11 refills | Status: DC

## 2017-08-19 NOTE — Progress Notes (Signed)
OFFICE NOTE  Chief Complaint:  No complaint  Primary Care Physician: Lawerance Cruel, MD  HPI:  Justin Tate is a 72 y.o. male with a past medial history significant for HTN, HLD, OSA not on CPAP, and chronic diastolic HF. He was last seen by Cecilie Kicks NP for preoperative evaluation. Echocardiogram obtained on 07/10/2016 showed EF 60-65%, grade 2 DD, mildly dilated biatrial chamber. He underwent right knee surgery in June. More recently, he presented to the hospital with chest discomfort and that noted to be in atrial fibrillation with RVR on 11/09/2016. This atrial fibrillation is new to him. tSH was normal. He was placed on amiodarone and eliquis. Initial troponin was mildly elevated, however subsequent troponin went up to 3.77. He also had acute on chronic renal insufficiency. Echocardiogram obtained on 11/11/2016 showed EF 45-50%, diffuse hypokinesis, grade 2 DD, severely dilated left atrium, PA peak pressure 62 mmHg, mild TR. He eventually underwent cardiac catheterization on 11/12/2016 which showed normal coronary arteries, mildly elevated LVEDP. His troponin elevation was felt to be due to demand ischemia instead. Since his discharge, he did go back to the ED on 11/14/2013 with weakness. He had right lower extremity erythema,she will he was treated with one week of Augmentin for potential cellulitis.  Amiodarone was planned for a short therapy for 8 weeks and potentially discontinue that that to see if he can remain in sinus rhythm by his home. He presents today for cardiology office visit. He denies any recurrent palpitation or chest discomfort since his discharge. He does have lower extremity pitting edema on physical exam, however he says this current level of edema is usual for him. I instructed him to take an additional dose of Lasix on a as needed basis for swelling. He has not noticed significant bleeding while on eliquis, however for some reason he was discharged on aspirin. Given  normal cardiac catheterization recently, I will discontinue the aspirin. He will need to discuss with his orthopedic physician regarding alternative therapy to Mobic. His lung is clear. I plan to obtain CBC, basic metabolic panel today. He will need to see Dr. Debara Pickett in 2 months. I will defer to Dr. Debara Pickett to decide whether or not to discontinue amiodarone at that time.  02/19/2017  Justin Tate returns for follow-up with me today.  Overall he seems to be doing well.  He is using his CPAP regularly and reports improvement in his symptoms.  He denies chest pain or worsening shortness of breath.  He denies any recurrent palpitations or A. fib.  EKG shows sinus rhythm with first-degree AV block.  As discussed we will consider discontinuing his amiodarone.  He should remain on anticoagulation.  08/19/2017  Justin Tate was seen today in follow-up.  Over the past 6 months he is done well.  He is managed to lose a couple of pounds, however his weight is up 10 pounds compared to his prior weight in November 2018.  He reports having some nonhealing sores on his backside.  He denies any palpitations or recurrent A. fib.  He has been off of amiodarone after we discontinued it.  He remains on anticoagulation with Eliquis.  EKG shows sinus rhythm with first-degree AV block today.  PMHx:  Past Medical History:  Diagnosis Date  . Acute on chronic diastolic CHF (congestive heart failure), NYHA class 4 ([place]) 11/09/2016  . AKI (acute kidney injury) ([place]) 11/09/2016  . Anemia   . Arthritis   . Cataracts, bilateral   . Chronic  diastolic CHF (congestive heart failure), NYHA class 3 ([place]) 11/09/2016  . Colon polyps   . Depression   . Dyspnea   . Essential hypertension 11/04/2012  . Hyperlipidemia   . Hypertension   . NSTEMI (non-ST elevated myocardial infarction) ([place]) 11/09/2016  . Pneumonia    hx  . Sleep apnea    dx 25 yrs ago used cpap 12 yrs or so-  not used 6-7 yrs  . Varicose veins     Past Surgical  History:  Procedure Laterality Date  . ENDOSCOPIC VEIN LASER TREATMENT    . KNEE ARTHROSCOPY Right 07/11/2016  . KNEE ARTHROSCOPY Right 07/11/2016   Procedure: ARTHROSCOPY KNEE;  Surgeon: Frederik Pear, MD;  Location: [place];  Service: Orthopedics;  Laterality: Right;  . LEFT HEART CATH AND CORONARY ANGIOGRAPHY N/A 11/12/2016   Procedure: LEFT HEART CATH AND CORONARY ANGIOGRAPHY;  Surgeon: Lorretta Harp, MD;  Location: [place];  Service: Cardiovascular;  Laterality: N/A;  . MENISCUS DEBRIDEMENT Right 07/11/2016   Procedure: DEBRIDEMENT OF Chondromalacia;  Surgeon: Frederik Pear, MD;  Location: [place];  Service: Orthopedics;  Laterality: Right;  . MENISECTOMY Right 07/11/2016   Procedure: PARTIAL MEDIAL MENISECTOMY;  Surgeon: Frederik Pear, MD;  Location: [place];  Service: Orthopedics;  Laterality: Right;  . TONSILLECTOMY      FAMHx:  Family History  Problem Relation Age of Onset  . Cancer Mother        colon  . Other Mother        varicose veins  . Heart disease Father   . Heart attack Father 66  . Nephrolithiasis Father   . Diabetes Sister        type 1    SOCHx:   reports that he has never smoked. He has never used smokeless tobacco. He reports that he does not drink alcohol or use drugs.  ALLERGIES:  Allergies  Allergen Reactions  . No Known Allergies     ROS: Pertinent items noted in HPI and remainder of comprehensive ROS otherwise negative.  HOME MEDS: Current Outpatient Medications on File Prior to Visit  Medication Sig Dispense Refill  . acetaminophen (TYLENOL) 325 MG tablet Take 650 mg by mouth every 6 (six) hours as needed.    Marland Kitchen amoxicillin-clavulanate (AUGMENTIN) 875-125 MG tablet Take 1 tablet daily as needed by mouth.    Marland Kitchen apixaban (ELIQUIS) 5 MG TABS tablet Take 1 tablet (5 mg total) by mouth 2 (two) times daily. 60 tablet 0  . buPROPion (WELLBUTRIN XL) 300 MG 24 hr tablet Take 300 mg by mouth daily.    . Cholecalciferol (VITAMIN D3) 2000 UNITS TABS  Take 2,000 Units by mouth daily.     Marland Kitchen darifenacin (ENABLEX) 15 MG 24 hr tablet Take 15 mg by mouth daily.  11  . ferrous sulfate 325 (65 FE) MG tablet Take 325 mg by mouth daily with breakfast.    . furosemide (LASIX) 20 MG tablet Take 1 tablet (20 mg total) by mouth daily. 90 tablet 2  . losartan (COZAAR) 25 MG tablet Take 1 tablet (25 mg total) by mouth daily. 90 tablet 2  . meloxicam (MOBIC) 15 MG tablet Take 15 mg by mouth daily. with food  1  . metoprolol succinate (TOPROL-XL) 50 MG 24 hr tablet Take 1 tablet (50 mg total) by mouth daily. Take with or immediately following a meal. 90 tablet 2  . Multiple Vitamin (MULTIVITAMIN) tablet Take 1 tablet by mouth daily.    Marland Kitchen NON  FORMULARY at bedtime.    . rosuvastatin (CRESTOR) 5 MG tablet Take 1 tablet (5 mg total) by mouth at bedtime. 30 tablet 11   No current facility-administered medications on file prior to visit.     LABS/IMAGING: No results found for this or any previous visit (from the past 48 hour(s)). No results found.  LIPID PANEL:    Component Value Date/Time   CHOL 142 11/13/2016 0832   TRIG 79 11/13/2016 0832   HDL 49 11/13/2016 0832   CHOLHDL 2.9 11/13/2016 0832   VLDL 16 11/13/2016 0832   LDLCALC 77 11/13/2016 0832     WEIGHTS: Wt Readings from Last 3 Encounters:  08/19/17 (!) 382 lb (173.3 kg)  02/19/17 (!) 384 lb (174.2 kg)  12/06/16 (!) 370 lb (167.8 kg)    VITALS: BP 118/72   Pulse 69   Ht 5\' 8"  (1.727 m)   Wt (!) 382 lb (173.3 kg)   BMI 58.08 kg/m   EXAM: General appearance: alert, no distress and morbidly obese Neck: no carotid bruit, no JVD and thyroid not enlarged, symmetric, no tenderness/mass/nodules Lungs: clear to auscultation bilaterally Heart: regular rate and rhythm Abdomen: soft, non-tender; bowel sounds normal; no masses,  no organomegaly Extremities: edema 1+ pitting edema and venous stasis dermatitis noted Pulses: 2+ and symmetric Skin: Skin color, texture, turgor normal. No rashes  or lesions Neurologic: Grossly normal Psych: Animated, poor focus  EKG: Sinus rhythm first-degree AV block 69, LBBB-personally reviewed  ASSESSMENT: 1. Chronic diastolic congestive heart failure 2. Morbid obesity 3. Moderate to severe pulmonary hypertension 4. Paroxysmal atrial fibrillation-CHADSVASC score of 4 5. OSA on CPAP 6. History of an STEMI with normal coronaries (10/2016) 7. Venous insufficiency 8. LBBB  PLAN: 1.   Mr. Shough is essentially unchanged from 6 months ago.  He may be a couple pounds lighter but needs to lose significant weight.  He does not appear to be in decompensated heart failure.  He has not had recurrent A. fib and his amiodarone has been discontinued.  He has a left bundle branch block which is stable.  He has moderate venous insufficiency with venous stasis changes.  No changes to his medicines today.  Follow-up 6 months.  Pixie Casino, MD, [facility], [place] Director of the Advanced Lipid Disorders &  Cardiovascular Risk Reduction Clinic Diplomate of the American Board of Clinical Lipidology Attending Cardiologist  Direct Dial: [phone]  Fax: [phone]  Website:  [email or web address]   Nadean Corwin Hilty 08/19/2017, 2:23 PM

## 2017-08-19 NOTE — Patient Instructions (Signed)
Your physician wants you to follow-up in: ONE YEAR with Dr. Hilty. You will receive a reminder letter in the mail two months in advance. If you don't receive a letter, please call our office to schedule the follow-up appointment.  

## 2017-08-28 DIAGNOSIS — I5032 Chronic diastolic (congestive) heart failure: Secondary | ICD-10-CM | POA: Diagnosis not present

## 2017-08-28 DIAGNOSIS — R531 Weakness: Secondary | ICD-10-CM | POA: Diagnosis not present

## 2017-08-28 DIAGNOSIS — G4733 Obstructive sleep apnea (adult) (pediatric): Secondary | ICD-10-CM | POA: Diagnosis not present

## 2017-08-29 DIAGNOSIS — G4733 Obstructive sleep apnea (adult) (pediatric): Secondary | ICD-10-CM | POA: Diagnosis not present

## 2017-09-28 DIAGNOSIS — I5032 Chronic diastolic (congestive) heart failure: Secondary | ICD-10-CM | POA: Diagnosis not present

## 2017-09-28 DIAGNOSIS — G4733 Obstructive sleep apnea (adult) (pediatric): Secondary | ICD-10-CM | POA: Diagnosis not present

## 2017-09-28 DIAGNOSIS — R531 Weakness: Secondary | ICD-10-CM | POA: Diagnosis not present

## 2017-09-29 DIAGNOSIS — G4733 Obstructive sleep apnea (adult) (pediatric): Secondary | ICD-10-CM | POA: Diagnosis not present

## 2017-10-11 ENCOUNTER — Other Ambulatory Visit: Payer: Self-pay | Admitting: *Deleted

## 2017-10-11 MED ORDER — METOPROLOL SUCCINATE ER 50 MG PO TB24: 50.0000 mg | ORAL_TABLET | Freq: Every day | ORAL | 2 refills | Status: DC

## 2017-10-11 MED ORDER — FUROSEMIDE 20 MG PO TABS: 20.0000 mg | ORAL_TABLET | Freq: Every day | ORAL | 2 refills | Status: DC

## 2017-10-29 DIAGNOSIS — C61 Malignant neoplasm of prostate: Secondary | ICD-10-CM | POA: Diagnosis not present

## 2017-11-05 DIAGNOSIS — C61 Malignant neoplasm of prostate: Secondary | ICD-10-CM | POA: Diagnosis not present

## 2017-11-05 DIAGNOSIS — R351 Nocturia: Secondary | ICD-10-CM | POA: Diagnosis not present

## 2017-11-13 DIAGNOSIS — I739 Peripheral vascular disease, unspecified: Secondary | ICD-10-CM | POA: Diagnosis not present

## 2017-11-13 DIAGNOSIS — L603 Nail dystrophy: Secondary | ICD-10-CM | POA: Diagnosis not present

## 2017-11-13 DIAGNOSIS — L84 Corns and callosities: Secondary | ICD-10-CM | POA: Diagnosis not present

## 2017-11-21 DIAGNOSIS — Z23 Encounter for immunization: Secondary | ICD-10-CM | POA: Diagnosis not present

## 2017-11-21 DIAGNOSIS — R05 Cough: Secondary | ICD-10-CM | POA: Diagnosis not present

## 2017-12-27 DIAGNOSIS — D3132 Benign neoplasm of left choroid: Secondary | ICD-10-CM | POA: Diagnosis not present

## 2017-12-27 DIAGNOSIS — H2513 Age-related nuclear cataract, bilateral: Secondary | ICD-10-CM | POA: Diagnosis not present

## 2017-12-27 DIAGNOSIS — H0100A Unspecified blepharitis right eye, upper and lower eyelids: Secondary | ICD-10-CM | POA: Diagnosis not present

## 2017-12-27 DIAGNOSIS — H52203 Unspecified astigmatism, bilateral: Secondary | ICD-10-CM | POA: Diagnosis not present

## 2018-02-06 DIAGNOSIS — G4733 Obstructive sleep apnea (adult) (pediatric): Secondary | ICD-10-CM | POA: Diagnosis not present

## 2018-02-12 DIAGNOSIS — L603 Nail dystrophy: Secondary | ICD-10-CM | POA: Diagnosis not present

## 2018-02-12 DIAGNOSIS — L84 Corns and callosities: Secondary | ICD-10-CM | POA: Diagnosis not present

## 2018-02-12 DIAGNOSIS — I739 Peripheral vascular disease, unspecified: Secondary | ICD-10-CM | POA: Diagnosis not present

## 2018-03-03 DIAGNOSIS — E7800 Pure hypercholesterolemia, unspecified: Secondary | ICD-10-CM | POA: Diagnosis not present

## 2018-03-03 DIAGNOSIS — I10 Essential (primary) hypertension: Secondary | ICD-10-CM | POA: Diagnosis not present

## 2018-03-03 DIAGNOSIS — R69 Illness, unspecified: Secondary | ICD-10-CM | POA: Diagnosis not present

## 2018-03-03 DIAGNOSIS — N183 Chronic kidney disease, stage 3 (moderate): Secondary | ICD-10-CM | POA: Diagnosis not present

## 2018-03-03 DIAGNOSIS — Z0000 Encounter for general adult medical examination without abnormal findings: Secondary | ICD-10-CM | POA: Diagnosis not present

## 2018-03-11 DIAGNOSIS — G4733 Obstructive sleep apnea (adult) (pediatric): Secondary | ICD-10-CM | POA: Diagnosis not present

## 2018-03-11 DIAGNOSIS — R531 Weakness: Secondary | ICD-10-CM | POA: Diagnosis not present

## 2018-03-11 DIAGNOSIS — I5032 Chronic diastolic (congestive) heart failure: Secondary | ICD-10-CM | POA: Diagnosis not present

## 2018-04-14 ENCOUNTER — Other Ambulatory Visit: Payer: Self-pay

## 2018-04-14 MED ORDER — APIXABAN 5 MG PO TABS: 5.0000 mg | ORAL_TABLET | Freq: Two times a day (BID) | ORAL | 1 refills | Status: DC

## 2018-05-20 IMAGING — CR DG CHEST 2V
2 series · 2 of 2 positions shown · non-contrast
Comparison: 11/09/2015, 06/10/2015.

CLINICAL DATA: Preoperative respiratory evaluation prior to right
knee arthroscopy on 07/11/2016. Current history of hypertension.

EXAM:
CHEST  2 VIEW

[w chest pa]
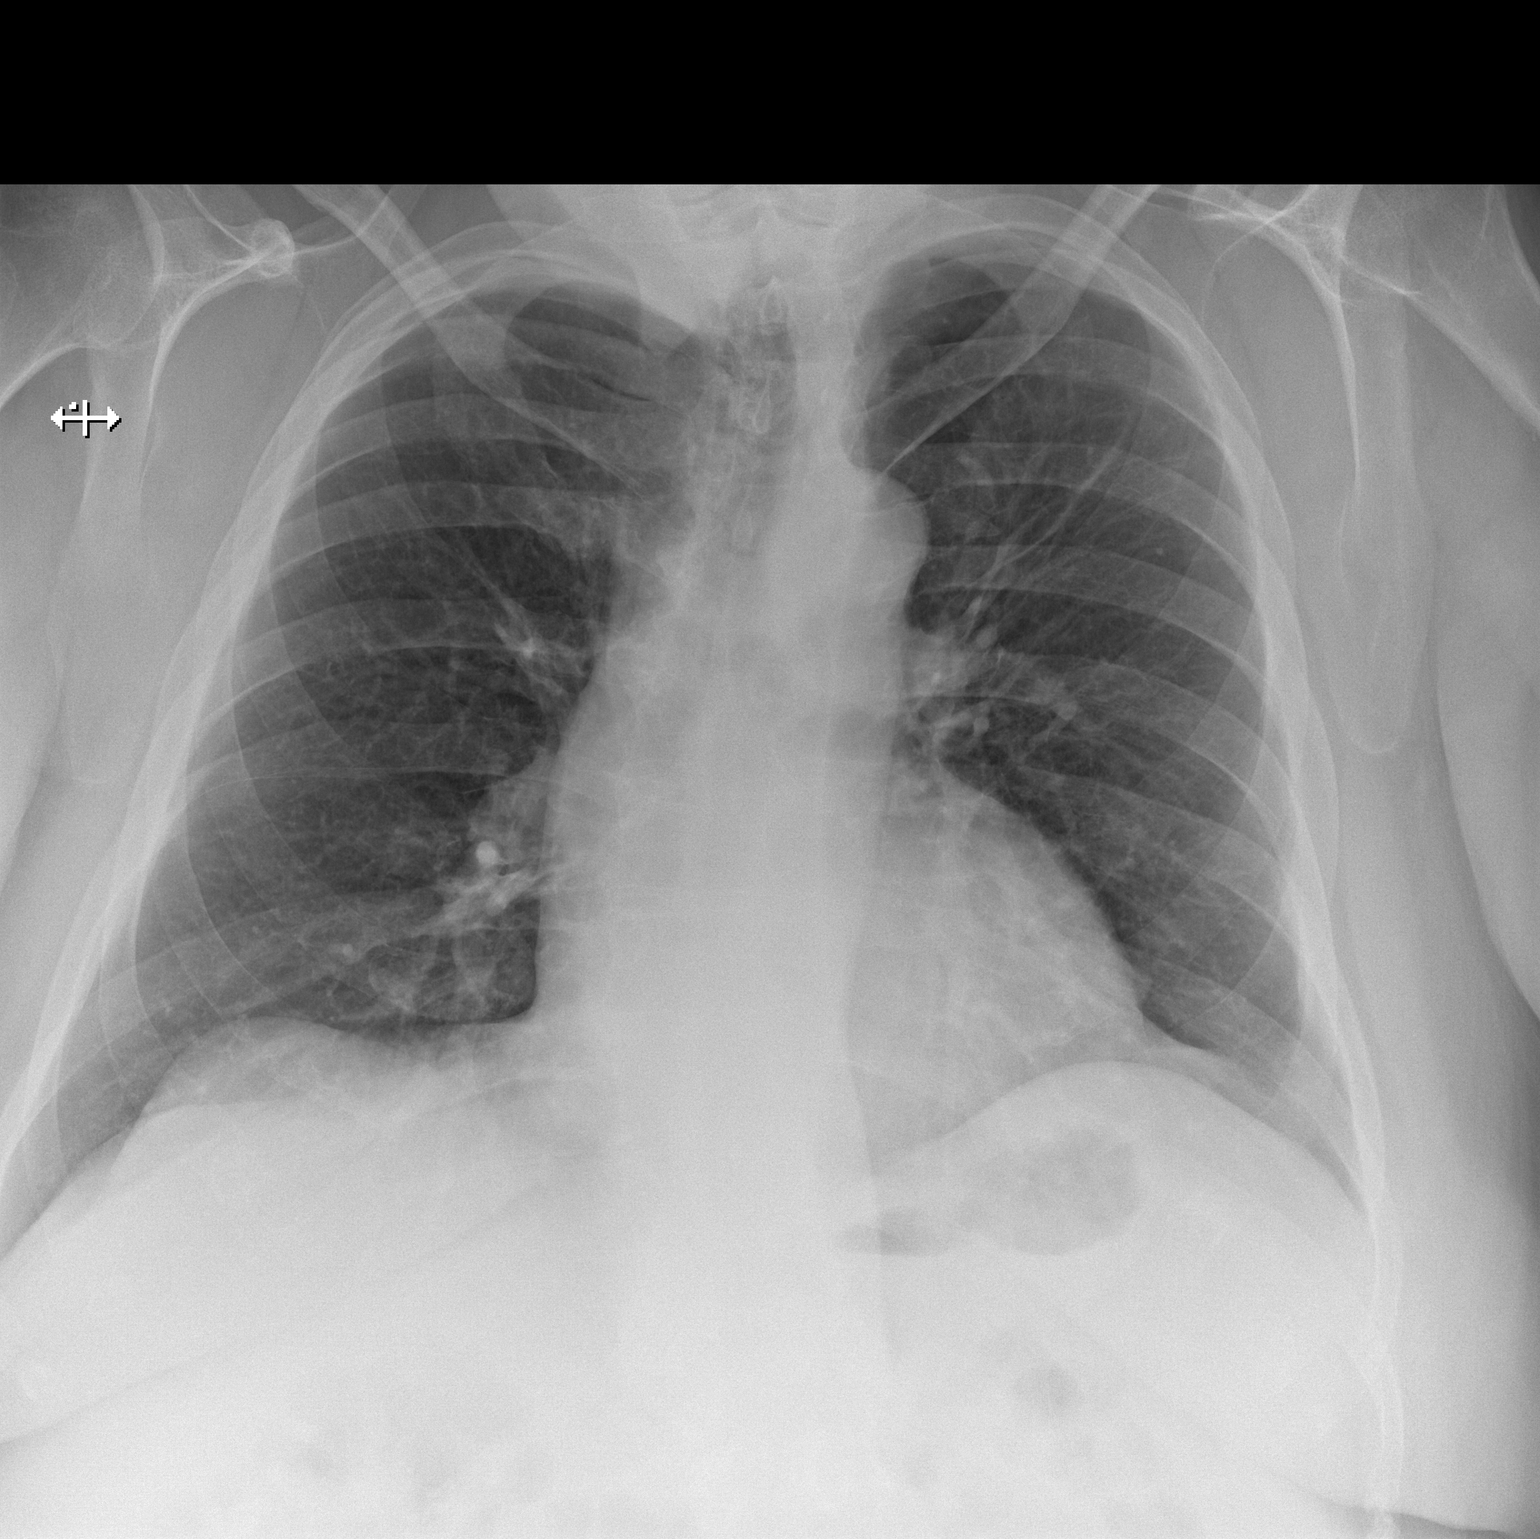

[w chest lat]
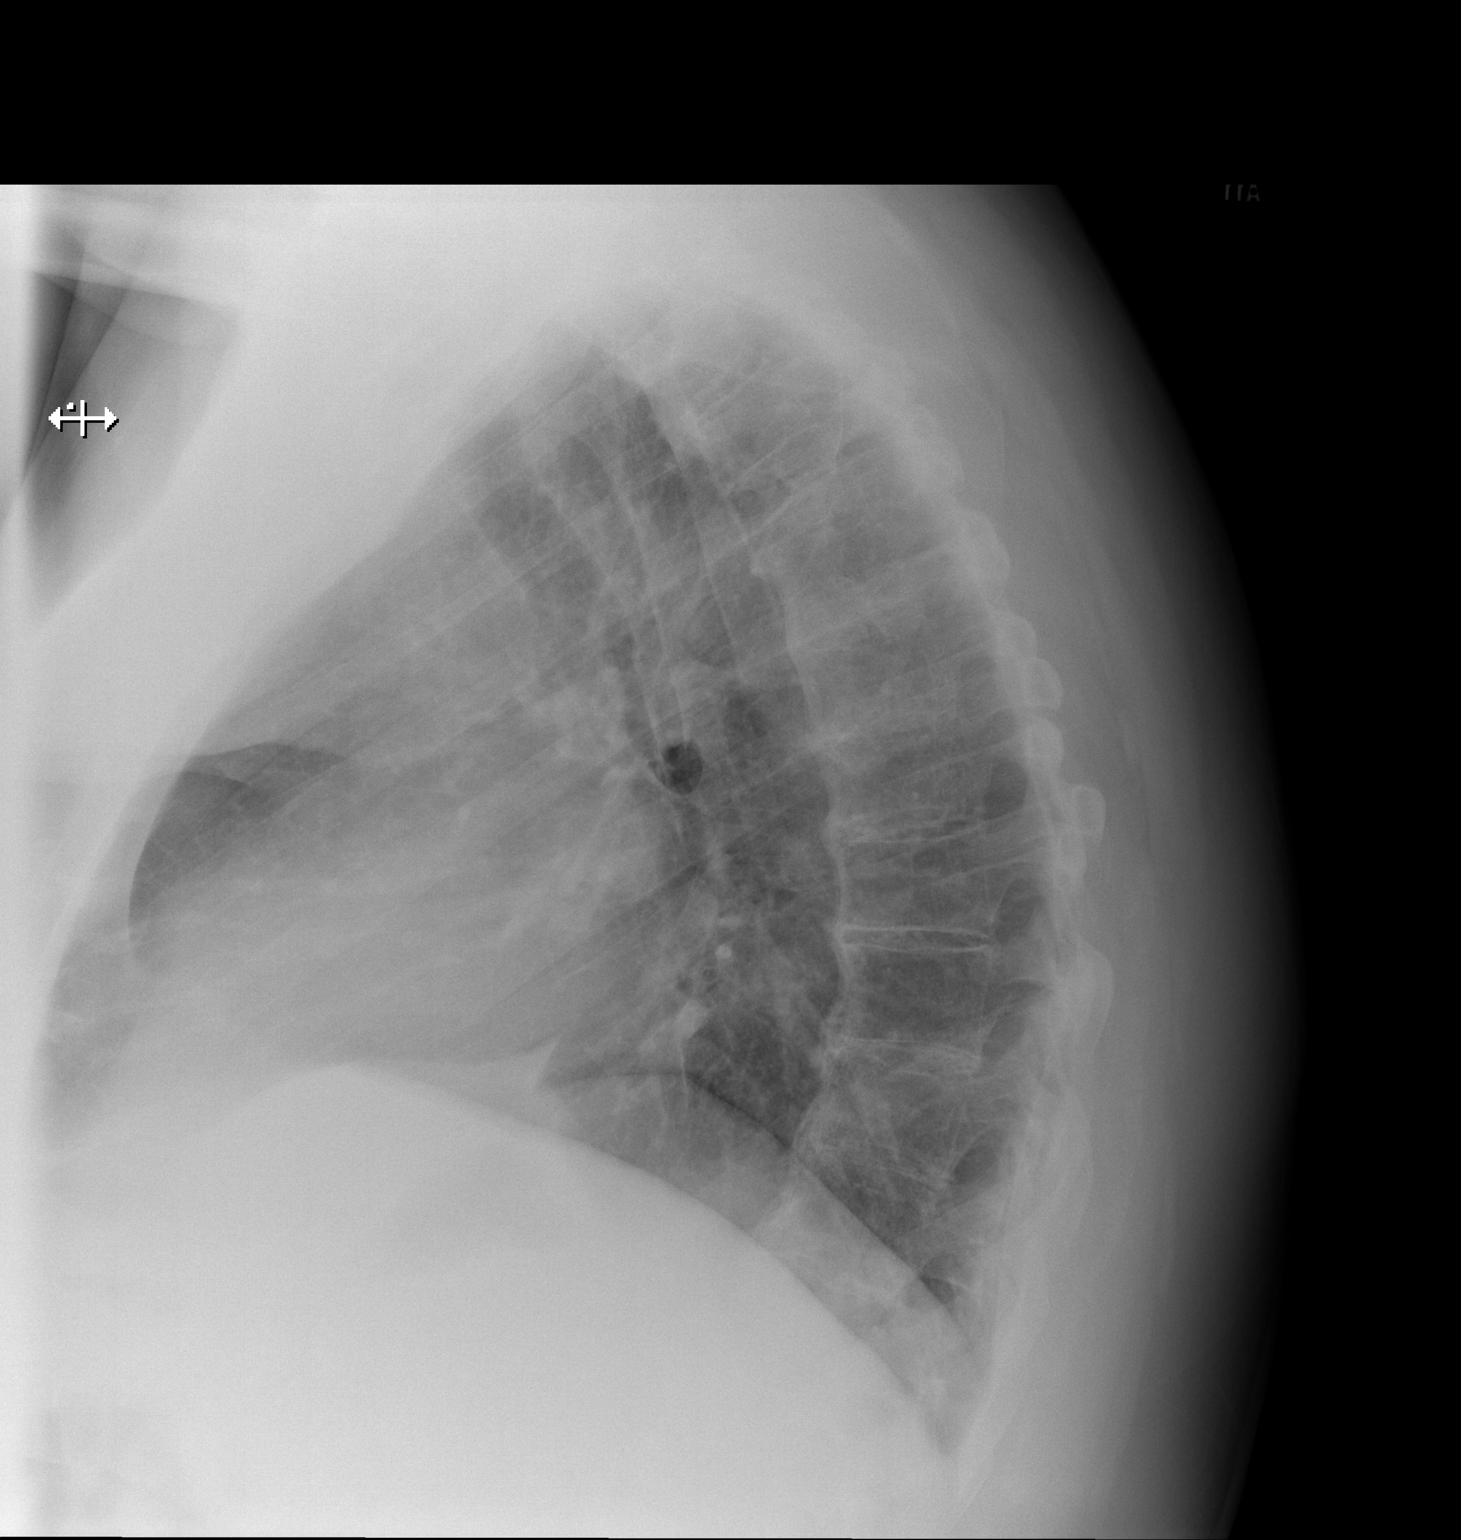

[2 of 2 positions shown; findings below may reference images not displayed]

FINDINGS: Cardiac silhouette upper normal in size, unchanged. Thoracic aorta
mildly tortuous and atherosclerotic, unchanged. Hilar and
mediastinal contours otherwise unremarkable. Scarring in the left
upper lobe, unchanged. Lungs otherwise clear. Bronchovascular
markings normal. Pulmonary vascularity normal. No pleural effusions.
Degenerative changes and DISH involving the thoracic spine.
IMPRESSION: No acute cardiopulmonary disease.

## 2018-07-17 ENCOUNTER — Other Ambulatory Visit: Payer: Self-pay | Admitting: Internal Medicine

## 2018-07-31 DIAGNOSIS — C61 Malignant neoplasm of prostate: Secondary | ICD-10-CM | POA: Diagnosis not present

## 2018-07-31 DIAGNOSIS — I10 Essential (primary) hypertension: Secondary | ICD-10-CM | POA: Diagnosis not present

## 2018-07-31 DIAGNOSIS — R69 Illness, unspecified: Secondary | ICD-10-CM | POA: Diagnosis not present

## 2018-07-31 DIAGNOSIS — E7800 Pure hypercholesterolemia, unspecified: Secondary | ICD-10-CM | POA: Diagnosis not present

## 2018-08-01 DIAGNOSIS — Z111 Encounter for screening for respiratory tuberculosis: Secondary | ICD-10-CM | POA: Diagnosis not present

## 2018-09-26 IMAGING — DX DG CHEST 1V PORT
1 series · 1 of 1 positions shown · non-contrast
Comparison: 07/03/2016

CLINICAL DATA: Heart palpitations and shortness of breath.

EXAM:
PORTABLE CHEST 1 VIEW

[chest ap]
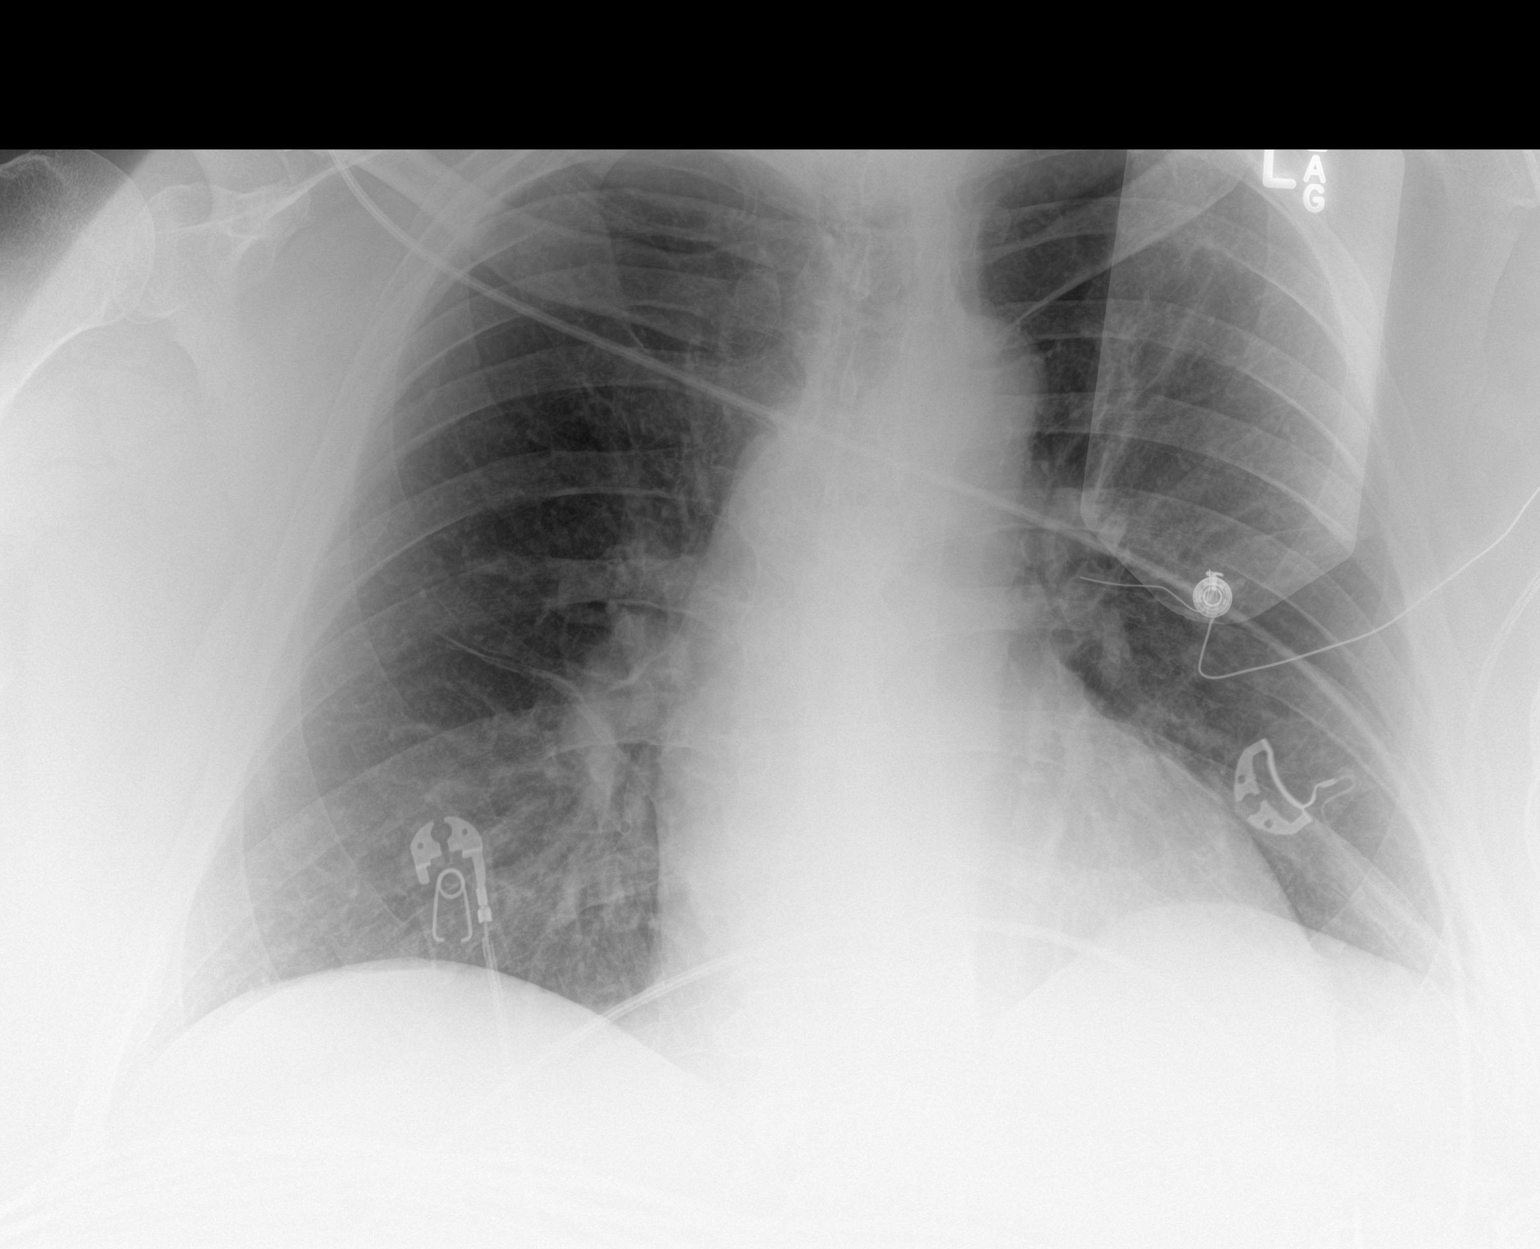

[1 of 1 positions shown; findings below may reference images not displayed]

FINDINGS: Shallow inspiration. Normal heart size and pulmonary vascularity. No
focal airspace disease or consolidation in the lungs. No blunting of
costophrenic angles. No pneumothorax. Mediastinal contours appear
intact.
IMPRESSION: No active disease.

## 2018-10-01 IMAGING — DX DG CHEST 2V
2 series · 2 of 2 positions shown · non-contrast
Comparison: 11/09/2016

CLINICAL DATA: Weakness, fall, shortness of breath

EXAM:
CHEST  2 VIEW

[chest lat]
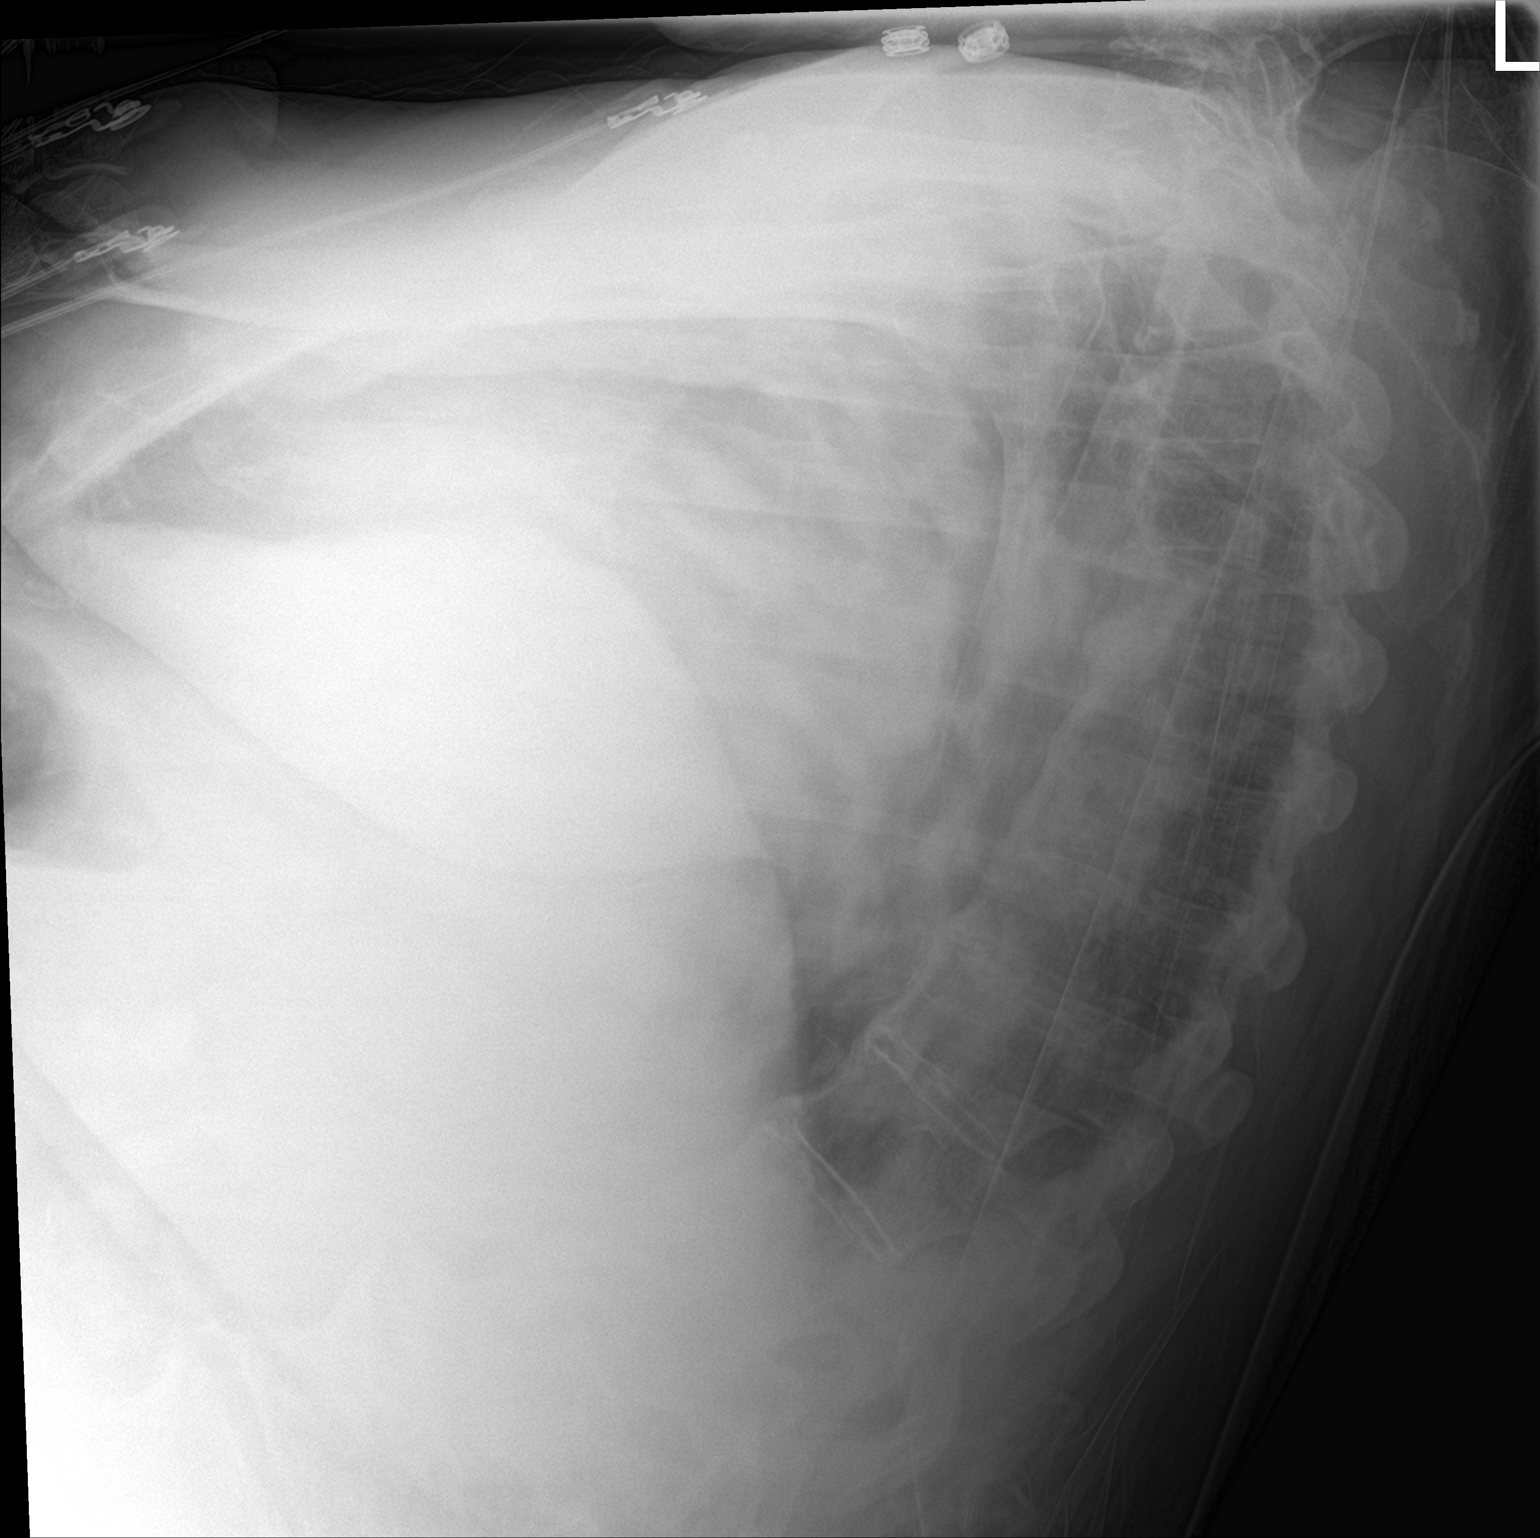

[chest ap]
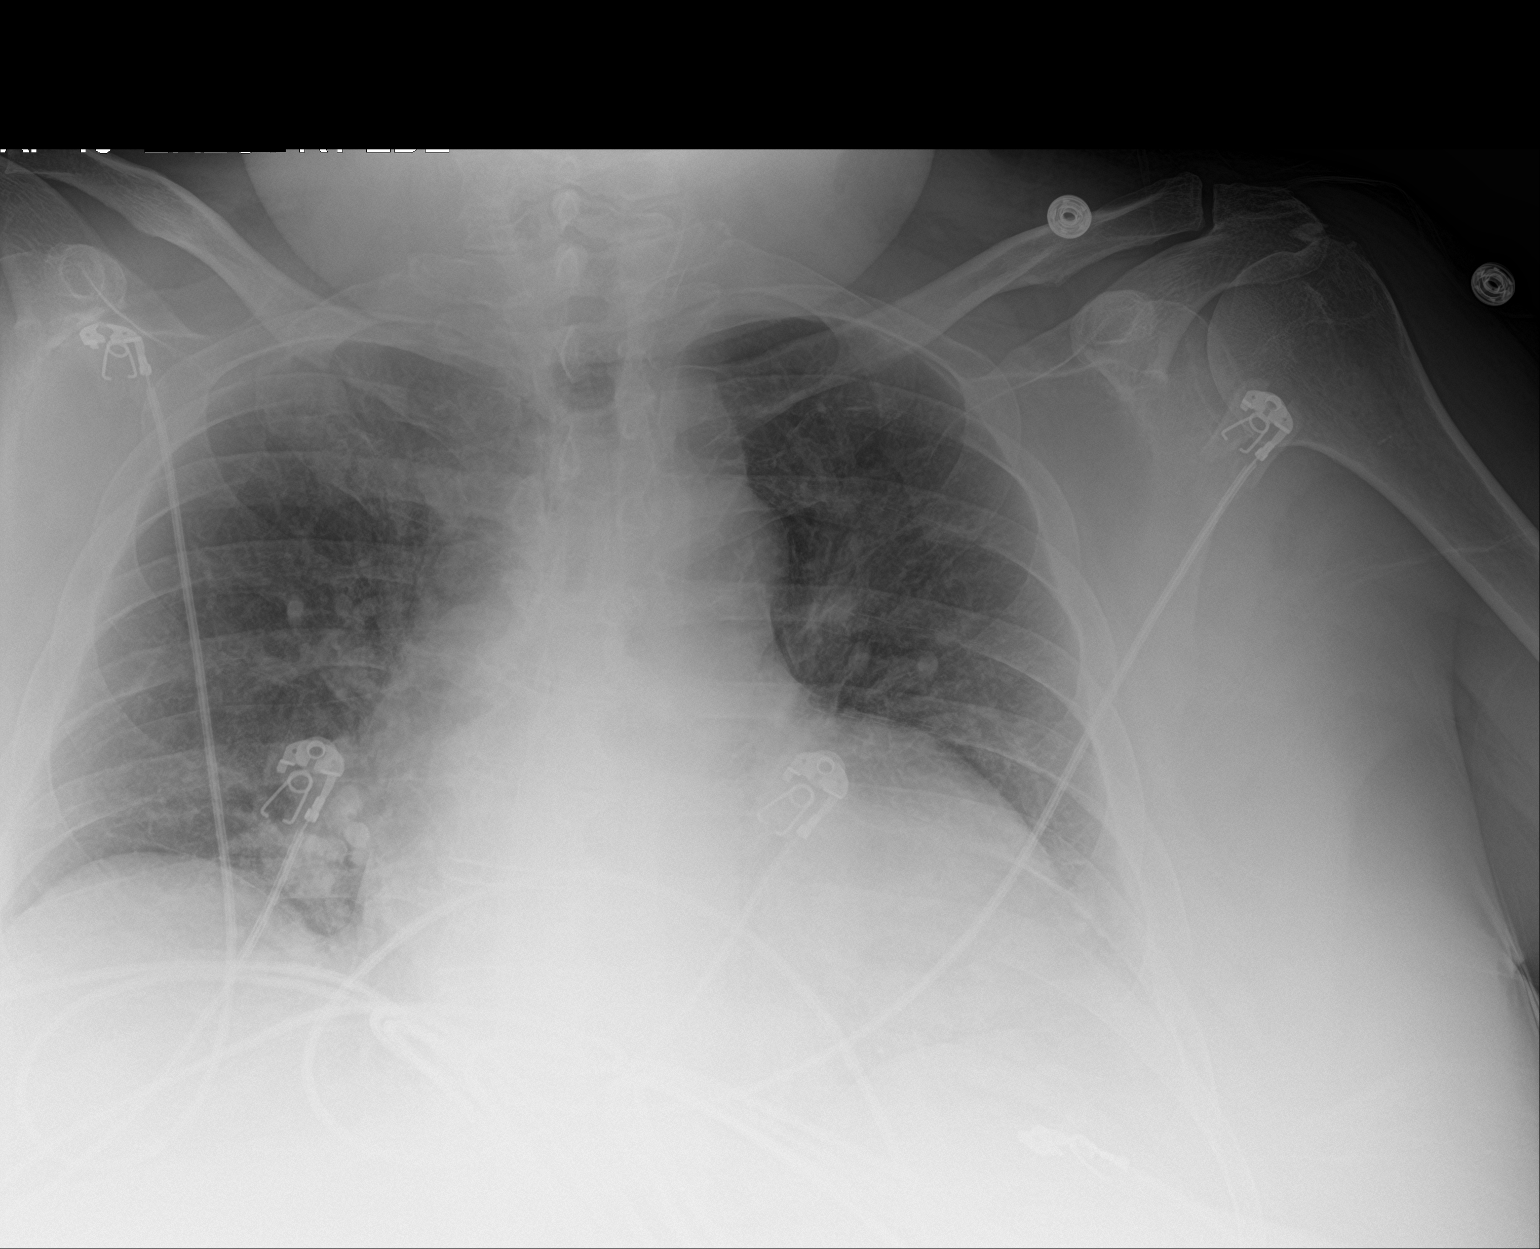

[2 of 2 positions shown; findings below may reference images not displayed]

FINDINGS: Cardiomegaly evident with central vascular congestion and lower lung
volumes. Associated bibasilar atelectasis. No definite focal
pneumonia, significant collapse or consolidation. Negative for
edema, large effusion or pneumothorax. Right costophrenic angle is
excluded on the study. Trachea is midline. Degenerative changes of
the spine and associated scoliosis.
IMPRESSION: Cardiomegaly with vascular congestion and basilar atelectasis. Lower
lung volumes.

## 2018-10-09 ENCOUNTER — Other Ambulatory Visit: Payer: Self-pay | Admitting: Internal Medicine

## 2018-10-10 ENCOUNTER — Other Ambulatory Visit: Payer: Self-pay | Admitting: Internal Medicine

## 2018-10-27 ENCOUNTER — Other Ambulatory Visit: Payer: Self-pay

## 2018-11-22 DIAGNOSIS — I5032 Chronic diastolic (congestive) heart failure: Secondary | ICD-10-CM | POA: Diagnosis not present

## 2018-11-22 DIAGNOSIS — I480 Paroxysmal atrial fibrillation: Secondary | ICD-10-CM | POA: Diagnosis not present

## 2018-11-22 DIAGNOSIS — I10 Essential (primary) hypertension: Secondary | ICD-10-CM | POA: Diagnosis not present

## 2018-11-22 DIAGNOSIS — C61 Malignant neoplasm of prostate: Secondary | ICD-10-CM | POA: Diagnosis not present

## 2018-11-22 DIAGNOSIS — N183 Chronic kidney disease, stage 3 unspecified: Secondary | ICD-10-CM | POA: Diagnosis not present

## 2018-11-22 DIAGNOSIS — I2510 Atherosclerotic heart disease of native coronary artery without angina pectoris: Secondary | ICD-10-CM | POA: Diagnosis not present

## 2018-11-22 DIAGNOSIS — R69 Illness, unspecified: Secondary | ICD-10-CM | POA: Diagnosis not present

## 2018-11-22 DIAGNOSIS — E7800 Pure hypercholesterolemia, unspecified: Secondary | ICD-10-CM | POA: Diagnosis not present

## 2018-12-26 ENCOUNTER — Ambulatory Visit: Payer: Medicare HMO | Admitting: Internal Medicine

## 2019-01-22 DIAGNOSIS — C61 Malignant neoplasm of prostate: Secondary | ICD-10-CM | POA: Diagnosis not present

## 2019-01-22 DIAGNOSIS — R69 Illness, unspecified: Secondary | ICD-10-CM | POA: Diagnosis not present

## 2019-01-22 DIAGNOSIS — E7800 Pure hypercholesterolemia, unspecified: Secondary | ICD-10-CM | POA: Diagnosis not present

## 2019-01-22 DIAGNOSIS — I2510 Atherosclerotic heart disease of native coronary artery without angina pectoris: Secondary | ICD-10-CM | POA: Diagnosis not present

## 2019-01-22 DIAGNOSIS — I5032 Chronic diastolic (congestive) heart failure: Secondary | ICD-10-CM | POA: Diagnosis not present

## 2019-01-22 DIAGNOSIS — I10 Essential (primary) hypertension: Secondary | ICD-10-CM | POA: Diagnosis not present

## 2019-01-22 DIAGNOSIS — I480 Paroxysmal atrial fibrillation: Secondary | ICD-10-CM | POA: Diagnosis not present

## 2019-01-25 ENCOUNTER — Other Ambulatory Visit: Payer: Self-pay | Admitting: Internal Medicine

## 2019-01-28 ENCOUNTER — Other Ambulatory Visit: Payer: Self-pay

## 2019-01-28 ENCOUNTER — Ambulatory Visit: Payer: Medicare HMO | Admitting: Internal Medicine

## 2019-01-28 ENCOUNTER — Encounter: Payer: Self-pay | Admitting: Internal Medicine

## 2019-01-28 VITALS — BP 119/73 | HR 65 | Ht 69.0 in | Wt 349.8 lb

## 2019-01-28 DIAGNOSIS — I5033 Acute on chronic diastolic (congestive) heart failure: Secondary | ICD-10-CM | POA: Diagnosis not present

## 2019-01-28 DIAGNOSIS — I10 Essential (primary) hypertension: Secondary | ICD-10-CM | POA: Diagnosis not present

## 2019-01-28 DIAGNOSIS — I447 Left bundle-branch block, unspecified: Secondary | ICD-10-CM | POA: Diagnosis not present

## 2019-01-28 DIAGNOSIS — I480 Paroxysmal atrial fibrillation: Secondary | ICD-10-CM | POA: Diagnosis not present

## 2019-01-28 MED ORDER — FUROSEMIDE 40 MG PO TABS: 40.0000 mg | ORAL_TABLET | Freq: Every day | ORAL | 3 refills | Status: AC

## 2019-01-28 NOTE — Patient Instructions (Signed)
Medication Instructions:  INCREASE LASIX (FUROSEMIDE) TO 40MG  DAILY *If you need a refill on your cardiac medications before your next appointment, please call your pharmacy*  Lab Work: NONE ORDERED  Testing/Procedures: NONE ORDERED  Follow-Up: At [facility], you and your health needs are our priority.  As part of our continuing mission to provide you with exceptional heart care, we have created designated Provider Care Teams.  These Care Teams include your primary Cardiologist (physician) and Advanced Practice Providers (APPs -  Physician Assistants and Nurse Practitioners) who all work together to provide you with the care you need, when you need it.  Your next appointment:   1 year(s)  The format for your next appointment:   In Person  Provider:   K. Mali Hilty, MD

## 2019-01-29 ENCOUNTER — Encounter: Payer: Self-pay | Admitting: Internal Medicine

## 2019-01-29 NOTE — Progress Notes (Signed)
OFFICE NOTE  Chief Complaint:  No complaints  Primary Care Physician: Lawerance Cruel, MD  HPI:  Justin Tate is a 75 y.o. male with a past medial history significant for HTN, HLD, OSA not on CPAP, and chronic diastolic HF. He was last seen by Cecilie Kicks NP for preoperative evaluation. Echocardiogram obtained on 07/10/2016 showed EF 60-65%, grade 2 DD, mildly dilated biatrial chamber. He underwent right knee surgery in June. More recently, he presented to the hospital with chest discomfort and that noted to be in atrial fibrillation with RVR on 11/09/2016. This atrial fibrillation is new to him. tSH was normal. He was placed on amiodarone and eliquis. Initial troponin was mildly elevated, however subsequent troponin went up to 3.77. He also had acute on chronic renal insufficiency. Echocardiogram obtained on 11/11/2016 showed EF 45-50%, diffuse hypokinesis, grade 2 DD, severely dilated left atrium, PA peak pressure 62 mmHg, mild TR. He eventually underwent cardiac catheterization on 11/12/2016 which showed normal coronary arteries, mildly elevated LVEDP. His troponin elevation was felt to be due to demand ischemia instead. Since his discharge, he did go back to the ED on 11/14/2013 with weakness. He had right lower extremity erythema,she will he was treated with one week of Augmentin for potential cellulitis.  Amiodarone was planned for a short therapy for 8 weeks and potentially discontinue that that to see if he can remain in sinus rhythm by his home. He presents today for cardiology office visit. He denies any recurrent palpitation or chest discomfort since his discharge. He does have lower extremity pitting edema on physical exam, however he says this current level of edema is usual for him. I instructed him to take an additional dose of Lasix on a as needed basis for swelling. He has not noticed significant bleeding while on eliquis, however for some reason he was discharged on aspirin.  Given normal cardiac catheterization recently, I will discontinue the aspirin. He will need to discuss with his orthopedic physician regarding alternative therapy to Mobic. His lung is clear. I plan to obtain CBC, basic metabolic panel today. He will need to see Dr. Debara Pickett in 2 months. I will defer to Dr. Debara Pickett to decide whether or not to discontinue amiodarone at that time.  02/19/2017  Justin Tate returns for follow-up with me today.  Overall he seems to be doing well.  He is using his CPAP regularly and reports improvement in his symptoms.  He denies chest pain or worsening shortness of breath.  He denies any recurrent palpitations or A. fib.  EKG shows sinus rhythm with first-degree AV block.  As discussed we will consider discontinuing his amiodarone.  He should remain on anticoagulation.  08/19/2017  Justin Tate was seen today in follow-up.  Over the past 6 months he is done well.  He is managed to lose a couple of pounds, however his weight is up 10 pounds compared to his prior weight in November 2018.  He reports having some nonhealing sores on his backside.  He denies any palpitations or recurrent A. fib.  He has been off of amiodarone after we discontinued it.  He remains on anticoagulation with Eliquis.  EKG shows sinus rhythm with first-degree AV block today.  01/29/2019  Justin Tate is seen today in follow-up.  Overall he is doing fairly well.  He is managed to lose significant weight now down to 349 from 382.  He says he has been less interested in food during the pandemic.  He continues to  have a significant amount of edema.  He is currently only on 20mg  of Lasix daily.  Recent labs show total cholesterol of 224, HDL 49 and LDL of 149, triglycerides 133.  Hemoglobin A1c of 6.3.  PMHx:  Past Medical History:  Diagnosis Date  . Acute on chronic diastolic CHF (congestive heart failure), NYHA class 4 ([place]) 11/09/2016  . AKI (acute kidney injury) ([place]) 11/09/2016  . Anemia   . Arthritis   .  Cataracts, bilateral   . Chronic diastolic CHF (congestive heart failure), NYHA class 3 ([place]) 11/09/2016  . Colon polyps   . Depression   . Dyspnea   . Essential hypertension 11/04/2012  . Hyperlipidemia   . Hypertension   . NSTEMI (non-ST elevated myocardial infarction) ([place]) 11/09/2016  . Pneumonia    hx  . Sleep apnea    dx 25 yrs ago used cpap 12 yrs or so-  not used 6-7 yrs  . Varicose veins     Past Surgical History:  Procedure Laterality Date  . ENDOSCOPIC VEIN LASER TREATMENT    . KNEE ARTHROSCOPY Right 07/11/2016  . KNEE ARTHROSCOPY Right 07/11/2016   Procedure: ARTHROSCOPY KNEE;  Surgeon: Frederik Pear, MD;  Location: [place];  Service: Orthopedics;  Laterality: Right;  . LEFT HEART CATH AND CORONARY ANGIOGRAPHY N/A 11/12/2016   Procedure: LEFT HEART CATH AND CORONARY ANGIOGRAPHY;  Surgeon: Lorretta Harp, MD;  Location: [facility];  Service: Cardiovascular;  Laterality: N/A;  . MENISCUS DEBRIDEMENT Right 07/11/2016   Procedure: DEBRIDEMENT OF Chondromalacia;  Surgeon: Frederik Pear, MD;  Location: [place];  Service: Orthopedics;  Laterality: Right;  . MENISECTOMY Right 07/11/2016   Procedure: PARTIAL MEDIAL MENISECTOMY;  Surgeon: Frederik Pear, MD;  Location: [place];  Service: Orthopedics;  Laterality: Right;  . TONSILLECTOMY      FAMHx:  Family History  Problem Relation Age of Onset  . Cancer Mother        colon  . Other Mother        varicose veins  . Heart disease Father   . Heart attack Father 3  . Nephrolithiasis Father   . Diabetes Sister        type 1    SOCHx:   reports that he has never smoked. He has never used smokeless tobacco. He reports that he does not drink alcohol or use drugs.  ALLERGIES:  Allergies  Allergen Reactions  . No Known Allergies     ROS: Pertinent items noted in HPI and remainder of comprehensive ROS otherwise negative.  HOME MEDS: Current Outpatient Medications on File Prior to Visit  Medication Sig Dispense Refill    . acetaminophen (TYLENOL) 325 MG tablet Take 650 mg by mouth every 6 (six) hours as needed.    Marland Kitchen amoxicillin-clavulanate (AUGMENTIN) 875-125 MG tablet Take 1 tablet daily as needed by mouth.    Marland Kitchen apixaban (ELIQUIS) 5 MG TABS tablet Take 1 tablet (5 mg total) by mouth 2 (two) times daily. 180 tablet 1  . buPROPion (WELLBUTRIN XL) 300 MG 24 hr tablet Take 300 mg by mouth daily.    . Cholecalciferol (VITAMIN D3) 2000 UNITS TABS Take 2,000 Units by mouth daily.     Marland Kitchen darifenacin (ENABLEX) 15 MG 24 hr tablet Take 15 mg by mouth daily.  11  . ferrous sulfate 325 (65 FE) MG tablet Take 325 mg by mouth daily with breakfast.    . losartan (COZAAR) 25 MG tablet Take 1 tablet (25 mg total) by mouth  daily. 90 tablet 2  . meloxicam (MOBIC) 15 MG tablet Take 15 mg by mouth daily. with food  1  . metoprolol succinate (TOPROL-XL) 50 MG 24 hr tablet TAKE 1 TABLET BY MOUTH EVERY DAY WITH OR IMMEDIATELY FOLLOWING A MEAL 30 tablet 0  . Multiple Vitamin (MULTIVITAMIN) tablet Take 1 tablet by mouth daily.    . NON FORMULARY at bedtime.    . rosuvastatin (CRESTOR) 5 MG tablet Take 1 tablet (5 mg total) by mouth at bedtime. 30 tablet 11   No current facility-administered medications on file prior to visit.    LABS/IMAGING: No results found for this or any previous visit (from the past 48 hour(s)). No results found.  LIPID PANEL:    Component Value Date/Time   CHOL 142 11/13/2016 0832   TRIG 79 11/13/2016 0832   HDL 49 11/13/2016 0832   CHOLHDL 2.9 11/13/2016 0832   VLDL 16 11/13/2016 0832   LDLCALC 77 11/13/2016 0832     WEIGHTS: Wt Readings from Last 3 Encounters:  01/28/19 (!) 349 lb 12.8 oz (158.7 kg)  08/19/17 (!) 382 lb (173.3 kg)  02/19/17 (!) 384 lb (174.2 kg)    VITALS: BP 119/73   Pulse 65   Ht 5\' 9"  (1.753 m)   Wt (!) 349 lb 12.8 oz (158.7 kg)   SpO2 95%   BMI 51.66 kg/m   EXAM: General appearance: alert, no distress and morbidly obese Neck: no carotid bruit, no JVD and thyroid  not enlarged, symmetric, no tenderness/mass/nodules Lungs: clear to auscultation bilaterally Heart: regular rate and rhythm Abdomen: soft, non-tender; bowel sounds normal; no masses,  no organomegaly Extremities: edema 1+ pitting edema and venous stasis dermatitis noted Pulses: 2+ and symmetric Skin: Skin color, texture, turgor normal. No rashes or lesions Neurologic: Grossly normal Psych: Animated, poor focus  EKG: Sinus rhythm first-degree AV block at 65, LVH with repull, possible lateral and inferior infarct patterns-personally reviewed  ASSESSMENT: 1. Chronic diastolic congestive heart failure 2. Morbid obesity 3. Moderate to severe pulmonary hypertension 4. Paroxysmal atrial fibrillation-CHADSVASC score of 4 5. OSA on CPAP 6. History of an STEMI with normal coronaries (10/2016) 7. Venous insufficiency 8. LBBB  PLAN: 1.   Justin Tate has managed to lose weight however still volume overloaded.  I recommend increasing his Lasix from 20 to 40 mg daily.  Need to continue to work on physical activity and weight loss.  Blood pressure is well controlled today.  Follow-up annually or sooner as necessary.  Justin Casino, MD, [facility], [place] Director of the Advanced Lipid Disorders &  Cardiovascular Risk Reduction Clinic Diplomate of the American Board of Clinical Lipidology Attending Cardiologist  Direct Dial: [phone]  Fax: [phone]  Website:  [email or web address] 01/29/2019, 10:47 AM

## 2019-02-10 DIAGNOSIS — G4733 Obstructive sleep apnea (adult) (pediatric): Secondary | ICD-10-CM | POA: Diagnosis not present

## 2019-02-11 ENCOUNTER — Other Ambulatory Visit: Payer: Self-pay | Admitting: Internal Medicine

## 2019-02-11 DIAGNOSIS — E7800 Pure hypercholesterolemia, unspecified: Secondary | ICD-10-CM | POA: Diagnosis not present

## 2019-02-13 DIAGNOSIS — H5213 Myopia, bilateral: Secondary | ICD-10-CM | POA: Diagnosis not present

## 2019-02-13 DIAGNOSIS — H2513 Age-related nuclear cataract, bilateral: Secondary | ICD-10-CM | POA: Diagnosis not present

## 2019-02-13 DIAGNOSIS — D3132 Benign neoplasm of left choroid: Secondary | ICD-10-CM | POA: Diagnosis not present

## 2019-02-13 DIAGNOSIS — H25013 Cortical age-related cataract, bilateral: Secondary | ICD-10-CM | POA: Diagnosis not present

## 2019-02-17 DIAGNOSIS — C61 Malignant neoplasm of prostate: Secondary | ICD-10-CM | POA: Diagnosis not present

## 2019-02-17 DIAGNOSIS — E7800 Pure hypercholesterolemia, unspecified: Secondary | ICD-10-CM | POA: Diagnosis not present

## 2019-02-17 DIAGNOSIS — I480 Paroxysmal atrial fibrillation: Secondary | ICD-10-CM | POA: Diagnosis not present

## 2019-02-17 DIAGNOSIS — I2510 Atherosclerotic heart disease of native coronary artery without angina pectoris: Secondary | ICD-10-CM | POA: Diagnosis not present

## 2019-02-17 DIAGNOSIS — R69 Illness, unspecified: Secondary | ICD-10-CM | POA: Diagnosis not present

## 2019-02-17 DIAGNOSIS — I5032 Chronic diastolic (congestive) heart failure: Secondary | ICD-10-CM | POA: Diagnosis not present

## 2019-02-17 DIAGNOSIS — I10 Essential (primary) hypertension: Secondary | ICD-10-CM | POA: Diagnosis not present

## 2019-03-10 DIAGNOSIS — Z20828 Contact with and (suspected) exposure to other viral communicable diseases: Secondary | ICD-10-CM | POA: Diagnosis not present

## 2019-03-26 DIAGNOSIS — D6869 Other thrombophilia: Secondary | ICD-10-CM | POA: Diagnosis not present

## 2019-03-26 DIAGNOSIS — C61 Malignant neoplasm of prostate: Secondary | ICD-10-CM | POA: Diagnosis not present

## 2019-03-26 DIAGNOSIS — I10 Essential (primary) hypertension: Secondary | ICD-10-CM | POA: Diagnosis not present

## 2019-03-26 DIAGNOSIS — R69 Illness, unspecified: Secondary | ICD-10-CM | POA: Diagnosis not present

## 2019-03-26 DIAGNOSIS — N183 Chronic kidney disease, stage 3 unspecified: Secondary | ICD-10-CM | POA: Diagnosis not present

## 2019-03-26 DIAGNOSIS — E7800 Pure hypercholesterolemia, unspecified: Secondary | ICD-10-CM | POA: Diagnosis not present

## 2019-03-26 DIAGNOSIS — Z111 Encounter for screening for respiratory tuberculosis: Secondary | ICD-10-CM | POA: Diagnosis not present

## 2019-03-26 DIAGNOSIS — Z0000 Encounter for general adult medical examination without abnormal findings: Secondary | ICD-10-CM | POA: Diagnosis not present

## 2019-03-26 DIAGNOSIS — I872 Venous insufficiency (chronic) (peripheral): Secondary | ICD-10-CM | POA: Diagnosis not present

## 2019-04-20 ENCOUNTER — Other Ambulatory Visit: Payer: Self-pay | Admitting: Internal Medicine

## 2019-04-20 DIAGNOSIS — I480 Paroxysmal atrial fibrillation: Secondary | ICD-10-CM | POA: Diagnosis not present

## 2019-04-20 DIAGNOSIS — I5032 Chronic diastolic (congestive) heart failure: Secondary | ICD-10-CM | POA: Diagnosis not present

## 2019-04-20 DIAGNOSIS — I10 Essential (primary) hypertension: Secondary | ICD-10-CM | POA: Diagnosis not present

## 2019-04-20 DIAGNOSIS — E7800 Pure hypercholesterolemia, unspecified: Secondary | ICD-10-CM | POA: Diagnosis not present

## 2019-04-20 DIAGNOSIS — R69 Illness, unspecified: Secondary | ICD-10-CM | POA: Diagnosis not present

## 2019-04-20 DIAGNOSIS — N183 Chronic kidney disease, stage 3 unspecified: Secondary | ICD-10-CM | POA: Diagnosis not present

## 2019-04-20 DIAGNOSIS — C61 Malignant neoplasm of prostate: Secondary | ICD-10-CM | POA: Diagnosis not present

## 2019-04-20 DIAGNOSIS — I2510 Atherosclerotic heart disease of native coronary artery without angina pectoris: Secondary | ICD-10-CM | POA: Diagnosis not present

## 2019-06-11 DIAGNOSIS — L603 Nail dystrophy: Secondary | ICD-10-CM | POA: Diagnosis not present

## 2019-06-11 DIAGNOSIS — I739 Peripheral vascular disease, unspecified: Secondary | ICD-10-CM | POA: Diagnosis not present

## 2019-06-11 DIAGNOSIS — L84 Corns and callosities: Secondary | ICD-10-CM | POA: Diagnosis not present

## 2019-06-16 DIAGNOSIS — C61 Malignant neoplasm of prostate: Secondary | ICD-10-CM | POA: Diagnosis not present

## 2019-06-23 DIAGNOSIS — N400 Enlarged prostate without lower urinary tract symptoms: Secondary | ICD-10-CM | POA: Diagnosis not present

## 2019-06-23 DIAGNOSIS — C61 Malignant neoplasm of prostate: Secondary | ICD-10-CM | POA: Diagnosis not present

## 2019-07-04 DIAGNOSIS — S59909A Unspecified injury of unspecified elbow, initial encounter: Secondary | ICD-10-CM | POA: Diagnosis not present

## 2019-07-04 DIAGNOSIS — Y9259 Other trade areas as the place of occurrence of the external cause: Secondary | ICD-10-CM | POA: Diagnosis not present

## 2019-07-04 DIAGNOSIS — W010XXA Fall on same level from slipping, tripping and stumbling without subsequent striking against object, initial encounter: Secondary | ICD-10-CM | POA: Diagnosis not present

## 2019-07-16 DIAGNOSIS — I5032 Chronic diastolic (congestive) heart failure: Secondary | ICD-10-CM | POA: Diagnosis not present

## 2019-07-16 DIAGNOSIS — R69 Illness, unspecified: Secondary | ICD-10-CM | POA: Diagnosis not present

## 2019-07-16 DIAGNOSIS — I2510 Atherosclerotic heart disease of native coronary artery without angina pectoris: Secondary | ICD-10-CM | POA: Diagnosis not present

## 2019-07-16 DIAGNOSIS — N183 Chronic kidney disease, stage 3 unspecified: Secondary | ICD-10-CM | POA: Diagnosis not present

## 2019-07-16 DIAGNOSIS — I480 Paroxysmal atrial fibrillation: Secondary | ICD-10-CM | POA: Diagnosis not present

## 2019-07-16 DIAGNOSIS — E7800 Pure hypercholesterolemia, unspecified: Secondary | ICD-10-CM | POA: Diagnosis not present

## 2019-07-16 DIAGNOSIS — C61 Malignant neoplasm of prostate: Secondary | ICD-10-CM | POA: Diagnosis not present

## 2019-07-16 DIAGNOSIS — I10 Essential (primary) hypertension: Secondary | ICD-10-CM | POA: Diagnosis not present

## 2019-07-17 ENCOUNTER — Other Ambulatory Visit: Payer: Self-pay | Admitting: Internal Medicine

## 2019-09-01 DIAGNOSIS — N3941 Urge incontinence: Secondary | ICD-10-CM | POA: Diagnosis not present

## 2019-09-01 DIAGNOSIS — I5032 Chronic diastolic (congestive) heart failure: Secondary | ICD-10-CM | POA: Diagnosis not present

## 2019-09-01 DIAGNOSIS — E7800 Pure hypercholesterolemia, unspecified: Secondary | ICD-10-CM | POA: Diagnosis not present

## 2019-09-01 DIAGNOSIS — I10 Essential (primary) hypertension: Secondary | ICD-10-CM | POA: Diagnosis not present

## 2019-09-08 DIAGNOSIS — I480 Paroxysmal atrial fibrillation: Secondary | ICD-10-CM | POA: Diagnosis not present

## 2019-09-08 DIAGNOSIS — I10 Essential (primary) hypertension: Secondary | ICD-10-CM | POA: Diagnosis not present

## 2019-09-08 DIAGNOSIS — R42 Dizziness and giddiness: Secondary | ICD-10-CM | POA: Diagnosis not present

## 2019-09-08 DIAGNOSIS — I5032 Chronic diastolic (congestive) heart failure: Secondary | ICD-10-CM | POA: Diagnosis not present

## 2019-09-09 DIAGNOSIS — E7800 Pure hypercholesterolemia, unspecified: Secondary | ICD-10-CM | POA: Diagnosis not present

## 2019-09-09 DIAGNOSIS — R42 Dizziness and giddiness: Secondary | ICD-10-CM | POA: Diagnosis not present

## 2019-09-17 DIAGNOSIS — E7800 Pure hypercholesterolemia, unspecified: Secondary | ICD-10-CM | POA: Diagnosis not present

## 2019-09-17 DIAGNOSIS — N183 Chronic kidney disease, stage 3 unspecified: Secondary | ICD-10-CM | POA: Diagnosis not present

## 2019-09-17 DIAGNOSIS — I10 Essential (primary) hypertension: Secondary | ICD-10-CM | POA: Diagnosis not present

## 2019-09-17 DIAGNOSIS — D649 Anemia, unspecified: Secondary | ICD-10-CM | POA: Diagnosis not present

## 2019-09-30 DIAGNOSIS — D649 Anemia, unspecified: Secondary | ICD-10-CM | POA: Diagnosis not present

## 2019-10-02 DIAGNOSIS — C61 Malignant neoplasm of prostate: Secondary | ICD-10-CM | POA: Diagnosis not present

## 2019-10-02 DIAGNOSIS — N3941 Urge incontinence: Secondary | ICD-10-CM | POA: Diagnosis not present

## 2019-10-02 DIAGNOSIS — R3912 Poor urinary stream: Secondary | ICD-10-CM | POA: Diagnosis not present

## 2019-10-02 DIAGNOSIS — N401 Enlarged prostate with lower urinary tract symptoms: Secondary | ICD-10-CM | POA: Diagnosis not present

## 2019-10-20 DIAGNOSIS — I454 Nonspecific intraventricular block: Secondary | ICD-10-CM | POA: Diagnosis not present

## 2019-10-20 DIAGNOSIS — G4730 Sleep apnea, unspecified: Secondary | ICD-10-CM | POA: Diagnosis not present

## 2019-10-20 DIAGNOSIS — Q211 Atrial septal defect: Secondary | ICD-10-CM | POA: Diagnosis not present

## 2019-10-20 DIAGNOSIS — R9431 Abnormal electrocardiogram [ECG] [EKG]: Secondary | ICD-10-CM | POA: Diagnosis not present

## 2019-10-20 DIAGNOSIS — I493 Ventricular premature depolarization: Secondary | ICD-10-CM | POA: Diagnosis not present

## 2019-10-20 DIAGNOSIS — E785 Hyperlipidemia, unspecified: Secondary | ICD-10-CM | POA: Diagnosis not present

## 2019-10-20 DIAGNOSIS — I5030 Unspecified diastolic (congestive) heart failure: Secondary | ICD-10-CM | POA: Diagnosis not present

## 2019-10-20 DIAGNOSIS — I10 Essential (primary) hypertension: Secondary | ICD-10-CM | POA: Diagnosis not present

## 2019-10-21 DIAGNOSIS — E538 Deficiency of other specified B group vitamins: Secondary | ICD-10-CM | POA: Diagnosis not present

## 2019-10-21 DIAGNOSIS — C61 Malignant neoplasm of prostate: Secondary | ICD-10-CM | POA: Diagnosis not present

## 2019-10-21 DIAGNOSIS — I5032 Chronic diastolic (congestive) heart failure: Secondary | ICD-10-CM | POA: Diagnosis not present

## 2019-10-21 DIAGNOSIS — D649 Anemia, unspecified: Secondary | ICD-10-CM | POA: Diagnosis not present

## 2019-11-04 DIAGNOSIS — E785 Hyperlipidemia, unspecified: Secondary | ICD-10-CM | POA: Diagnosis not present

## 2019-11-04 DIAGNOSIS — I110 Hypertensive heart disease with heart failure: Secondary | ICD-10-CM | POA: Diagnosis not present

## 2019-11-04 DIAGNOSIS — I083 Combined rheumatic disorders of mitral, aortic and tricuspid valves: Secondary | ICD-10-CM | POA: Diagnosis not present

## 2019-11-04 DIAGNOSIS — I5030 Unspecified diastolic (congestive) heart failure: Secondary | ICD-10-CM | POA: Diagnosis not present

## 2019-11-04 DIAGNOSIS — I509 Heart failure, unspecified: Secondary | ICD-10-CM | POA: Diagnosis not present

## 2019-11-04 DIAGNOSIS — I480 Paroxysmal atrial fibrillation: Secondary | ICD-10-CM | POA: Diagnosis not present

## 2019-11-04 DIAGNOSIS — I081 Rheumatic disorders of both mitral and tricuspid valves: Secondary | ICD-10-CM | POA: Diagnosis not present

## 2019-11-04 DIAGNOSIS — G4730 Sleep apnea, unspecified: Secondary | ICD-10-CM | POA: Diagnosis not present

## 2019-11-11 DIAGNOSIS — H524 Presbyopia: Secondary | ICD-10-CM | POA: Diagnosis not present

## 2019-11-11 DIAGNOSIS — H2513 Age-related nuclear cataract, bilateral: Secondary | ICD-10-CM | POA: Diagnosis not present

## 2019-11-11 DIAGNOSIS — H52223 Regular astigmatism, bilateral: Secondary | ICD-10-CM | POA: Diagnosis not present

## 2019-11-23 DIAGNOSIS — R059 Cough, unspecified: Secondary | ICD-10-CM | POA: Diagnosis not present

## 2019-11-23 DIAGNOSIS — J918 Pleural effusion in other conditions classified elsewhere: Secondary | ICD-10-CM | POA: Diagnosis not present

## 2019-11-23 DIAGNOSIS — I5022 Chronic systolic (congestive) heart failure: Secondary | ICD-10-CM | POA: Diagnosis not present

## 2020-01-01 DIAGNOSIS — J90 Pleural effusion, not elsewhere classified: Secondary | ICD-10-CM | POA: Diagnosis not present

## 2020-01-01 DIAGNOSIS — R059 Cough, unspecified: Secondary | ICD-10-CM | POA: Diagnosis not present

## 2020-01-01 DIAGNOSIS — G4733 Obstructive sleep apnea (adult) (pediatric): Secondary | ICD-10-CM | POA: Diagnosis not present

## 2020-01-06 DIAGNOSIS — I480 Paroxysmal atrial fibrillation: Secondary | ICD-10-CM | POA: Diagnosis not present

## 2020-01-06 DIAGNOSIS — I5022 Chronic systolic (congestive) heart failure: Secondary | ICD-10-CM | POA: Diagnosis not present

## 2020-01-06 DIAGNOSIS — I447 Left bundle-branch block, unspecified: Secondary | ICD-10-CM | POA: Diagnosis not present

## 2020-01-06 DIAGNOSIS — I10 Essential (primary) hypertension: Secondary | ICD-10-CM | POA: Diagnosis not present

## 2020-01-06 DIAGNOSIS — E785 Hyperlipidemia, unspecified: Secondary | ICD-10-CM | POA: Diagnosis not present

## 2020-01-06 DIAGNOSIS — I429 Cardiomyopathy, unspecified: Secondary | ICD-10-CM | POA: Diagnosis not present

## 2020-01-13 DIAGNOSIS — B351 Tinea unguium: Secondary | ICD-10-CM | POA: Diagnosis not present

## 2020-01-13 DIAGNOSIS — R053 Chronic cough: Secondary | ICD-10-CM | POA: Diagnosis not present

## 2020-01-13 DIAGNOSIS — I5032 Chronic diastolic (congestive) heart failure: Secondary | ICD-10-CM | POA: Diagnosis not present

## 2020-01-29 DIAGNOSIS — I5043 Acute on chronic combined systolic (congestive) and diastolic (congestive) heart failure: Secondary | ICD-10-CM | POA: Diagnosis not present

## 2020-01-29 DIAGNOSIS — R000 Tachycardia, unspecified: Secondary | ICD-10-CM | POA: Diagnosis not present

## 2020-01-29 DIAGNOSIS — I081 Rheumatic disorders of both mitral and tricuspid valves: Secondary | ICD-10-CM | POA: Diagnosis not present

## 2020-01-29 DIAGNOSIS — N183 Chronic kidney disease, stage 3 unspecified: Secondary | ICD-10-CM | POA: Diagnosis not present

## 2020-01-29 DIAGNOSIS — I472 Ventricular tachycardia: Secondary | ICD-10-CM | POA: Diagnosis not present

## 2020-01-29 DIAGNOSIS — J9811 Atelectasis: Secondary | ICD-10-CM | POA: Diagnosis not present

## 2020-01-29 DIAGNOSIS — G4730 Sleep apnea, unspecified: Secondary | ICD-10-CM | POA: Diagnosis not present

## 2020-01-29 DIAGNOSIS — I130 Hypertensive heart and chronic kidney disease with heart failure and stage 1 through stage 4 chronic kidney disease, or unspecified chronic kidney disease: Secondary | ICD-10-CM | POA: Diagnosis not present

## 2020-01-29 DIAGNOSIS — I10 Essential (primary) hypertension: Secondary | ICD-10-CM | POA: Diagnosis not present

## 2020-01-29 DIAGNOSIS — I272 Pulmonary hypertension, unspecified: Secondary | ICD-10-CM | POA: Diagnosis not present

## 2020-01-29 DIAGNOSIS — J90 Pleural effusion, not elsewhere classified: Secondary | ICD-10-CM | POA: Diagnosis not present

## 2020-01-29 DIAGNOSIS — R0602 Shortness of breath: Secondary | ICD-10-CM | POA: Diagnosis not present

## 2020-01-29 DIAGNOSIS — I5023 Acute on chronic systolic (congestive) heart failure: Secondary | ICD-10-CM | POA: Diagnosis not present

## 2020-01-29 DIAGNOSIS — N179 Acute kidney failure, unspecified: Secondary | ICD-10-CM | POA: Diagnosis not present

## 2020-01-29 DIAGNOSIS — N1832 Chronic kidney disease, stage 3b: Secondary | ICD-10-CM | POA: Diagnosis not present

## 2020-01-29 DIAGNOSIS — K8020 Calculus of gallbladder without cholecystitis without obstruction: Secondary | ICD-10-CM | POA: Diagnosis not present

## 2020-01-29 DIAGNOSIS — K761 Chronic passive congestion of liver: Secondary | ICD-10-CM | POA: Diagnosis not present

## 2020-01-29 DIAGNOSIS — I4892 Unspecified atrial flutter: Secondary | ICD-10-CM | POA: Diagnosis not present

## 2020-01-29 DIAGNOSIS — I429 Cardiomyopathy, unspecified: Secondary | ICD-10-CM | POA: Diagnosis not present

## 2020-01-29 DIAGNOSIS — I110 Hypertensive heart disease with heart failure: Secondary | ICD-10-CM | POA: Diagnosis not present

## 2020-01-29 DIAGNOSIS — Z743 Need for continuous supervision: Secondary | ICD-10-CM | POA: Diagnosis not present

## 2020-01-29 DIAGNOSIS — J9601 Acute respiratory failure with hypoxia: Secondary | ICD-10-CM | POA: Diagnosis not present

## 2020-01-29 DIAGNOSIS — I428 Other cardiomyopathies: Secondary | ICD-10-CM | POA: Diagnosis not present

## 2020-01-29 DIAGNOSIS — I959 Hypotension, unspecified: Secondary | ICD-10-CM | POA: Diagnosis not present

## 2020-01-29 DIAGNOSIS — R0600 Dyspnea, unspecified: Secondary | ICD-10-CM | POA: Diagnosis not present

## 2020-01-29 DIAGNOSIS — I480 Paroxysmal atrial fibrillation: Secondary | ICD-10-CM | POA: Diagnosis not present

## 2020-01-29 DIAGNOSIS — Z6841 Body Mass Index (BMI) 40.0 and over, adult: Secondary | ICD-10-CM | POA: Diagnosis not present

## 2020-01-29 DIAGNOSIS — I4589 Other specified conduction disorders: Secondary | ICD-10-CM | POA: Diagnosis not present

## 2020-01-29 DIAGNOSIS — R0902 Hypoxemia: Secondary | ICD-10-CM | POA: Diagnosis not present

## 2020-01-29 DIAGNOSIS — I499 Cardiac arrhythmia, unspecified: Secondary | ICD-10-CM | POA: Diagnosis not present

## 2020-01-29 DIAGNOSIS — R6510 Systemic inflammatory response syndrome (SIRS) of non-infectious origin without acute organ dysfunction: Secondary | ICD-10-CM | POA: Diagnosis not present

## 2020-01-29 DIAGNOSIS — I509 Heart failure, unspecified: Secondary | ICD-10-CM | POA: Diagnosis not present

## 2020-02-05 DIAGNOSIS — D649 Anemia, unspecified: Secondary | ICD-10-CM | POA: Diagnosis not present

## 2020-02-05 DIAGNOSIS — I509 Heart failure, unspecified: Secondary | ICD-10-CM | POA: Diagnosis not present

## 2020-02-05 DIAGNOSIS — N400 Enlarged prostate without lower urinary tract symptoms: Secondary | ICD-10-CM | POA: Diagnosis not present

## 2020-02-05 DIAGNOSIS — E538 Deficiency of other specified B group vitamins: Secondary | ICD-10-CM | POA: Diagnosis not present

## 2020-02-09 DIAGNOSIS — I472 Ventricular tachycardia: Secondary | ICD-10-CM | POA: Diagnosis not present

## 2020-02-09 DIAGNOSIS — I5032 Chronic diastolic (congestive) heart failure: Secondary | ICD-10-CM | POA: Diagnosis not present

## 2020-02-09 DIAGNOSIS — I10 Essential (primary) hypertension: Secondary | ICD-10-CM | POA: Diagnosis not present

## 2020-02-09 DIAGNOSIS — I509 Heart failure, unspecified: Secondary | ICD-10-CM | POA: Diagnosis not present

## 2020-02-10 DIAGNOSIS — L89893 Pressure ulcer of other site, stage 3: Secondary | ICD-10-CM | POA: Diagnosis not present

## 2020-02-10 DIAGNOSIS — L988 Other specified disorders of the skin and subcutaneous tissue: Secondary | ICD-10-CM | POA: Diagnosis not present

## 2020-02-11 DIAGNOSIS — I10 Essential (primary) hypertension: Secondary | ICD-10-CM | POA: Diagnosis not present

## 2020-02-11 DIAGNOSIS — I5032 Chronic diastolic (congestive) heart failure: Secondary | ICD-10-CM | POA: Diagnosis not present

## 2020-02-11 DIAGNOSIS — I480 Paroxysmal atrial fibrillation: Secondary | ICD-10-CM | POA: Diagnosis not present

## 2020-02-11 DIAGNOSIS — I472 Ventricular tachycardia: Secondary | ICD-10-CM | POA: Diagnosis not present

## 2020-02-11 DIAGNOSIS — I5023 Acute on chronic systolic (congestive) heart failure: Secondary | ICD-10-CM | POA: Diagnosis not present

## 2020-02-12 DIAGNOSIS — D649 Anemia, unspecified: Secondary | ICD-10-CM | POA: Diagnosis not present

## 2020-02-12 DIAGNOSIS — N183 Chronic kidney disease, stage 3 unspecified: Secondary | ICD-10-CM | POA: Diagnosis not present

## 2020-02-12 DIAGNOSIS — I480 Paroxysmal atrial fibrillation: Secondary | ICD-10-CM | POA: Diagnosis not present

## 2020-02-12 DIAGNOSIS — I5032 Chronic diastolic (congestive) heart failure: Secondary | ICD-10-CM | POA: Diagnosis not present

## 2020-02-16 DIAGNOSIS — I480 Paroxysmal atrial fibrillation: Secondary | ICD-10-CM | POA: Diagnosis not present

## 2020-02-16 DIAGNOSIS — N183 Chronic kidney disease, stage 3 unspecified: Secondary | ICD-10-CM | POA: Diagnosis not present

## 2020-02-16 DIAGNOSIS — I472 Ventricular tachycardia: Secondary | ICD-10-CM | POA: Diagnosis not present

## 2020-02-16 DIAGNOSIS — I5020 Unspecified systolic (congestive) heart failure: Secondary | ICD-10-CM | POA: Diagnosis not present

## 2020-02-17 DIAGNOSIS — R634 Abnormal weight loss: Secondary | ICD-10-CM | POA: Diagnosis not present

## 2020-02-17 DIAGNOSIS — I10 Essential (primary) hypertension: Secondary | ICD-10-CM | POA: Diagnosis not present

## 2020-02-17 DIAGNOSIS — Z515 Encounter for palliative care: Secondary | ICD-10-CM | POA: Diagnosis not present

## 2020-02-17 DIAGNOSIS — F411 Generalized anxiety disorder: Secondary | ICD-10-CM | POA: Diagnosis not present

## 2020-02-17 DIAGNOSIS — L988 Other specified disorders of the skin and subcutaneous tissue: Secondary | ICD-10-CM | POA: Diagnosis not present

## 2020-02-17 DIAGNOSIS — L89893 Pressure ulcer of other site, stage 3: Secondary | ICD-10-CM | POA: Diagnosis not present

## 2020-02-18 DIAGNOSIS — N183 Chronic kidney disease, stage 3 unspecified: Secondary | ICD-10-CM | POA: Diagnosis not present

## 2020-02-18 DIAGNOSIS — I5020 Unspecified systolic (congestive) heart failure: Secondary | ICD-10-CM | POA: Diagnosis not present

## 2020-02-18 DIAGNOSIS — R0902 Hypoxemia: Secondary | ICD-10-CM | POA: Diagnosis not present

## 2020-02-18 DIAGNOSIS — I429 Cardiomyopathy, unspecified: Secondary | ICD-10-CM | POA: Diagnosis not present

## 2020-02-18 DIAGNOSIS — D649 Anemia, unspecified: Secondary | ICD-10-CM | POA: Diagnosis not present

## 2020-02-18 DIAGNOSIS — I340 Nonrheumatic mitral (valve) insufficiency: Secondary | ICD-10-CM | POA: Diagnosis not present

## 2020-02-18 DIAGNOSIS — N1832 Chronic kidney disease, stage 3b: Secondary | ICD-10-CM | POA: Diagnosis not present

## 2020-02-18 DIAGNOSIS — I5022 Chronic systolic (congestive) heart failure: Secondary | ICD-10-CM | POA: Diagnosis not present

## 2020-02-18 DIAGNOSIS — Z20822 Contact with and (suspected) exposure to covid-19: Secondary | ICD-10-CM | POA: Diagnosis not present

## 2020-02-18 DIAGNOSIS — I951 Orthostatic hypotension: Secondary | ICD-10-CM | POA: Diagnosis not present

## 2020-02-18 DIAGNOSIS — M1990 Unspecified osteoarthritis, unspecified site: Secondary | ICD-10-CM | POA: Diagnosis not present

## 2020-02-18 DIAGNOSIS — G4733 Obstructive sleep apnea (adult) (pediatric): Secondary | ICD-10-CM | POA: Diagnosis not present

## 2020-02-18 DIAGNOSIS — I130 Hypertensive heart and chronic kidney disease with heart failure and stage 1 through stage 4 chronic kidney disease, or unspecified chronic kidney disease: Secondary | ICD-10-CM | POA: Diagnosis not present

## 2020-02-18 DIAGNOSIS — E785 Hyperlipidemia, unspecified: Secondary | ICD-10-CM | POA: Diagnosis not present

## 2020-02-18 DIAGNOSIS — I10 Essential (primary) hypertension: Secondary | ICD-10-CM | POA: Diagnosis not present

## 2020-02-18 DIAGNOSIS — I480 Paroxysmal atrial fibrillation: Secondary | ICD-10-CM | POA: Diagnosis not present

## 2020-02-18 DIAGNOSIS — I447 Left bundle-branch block, unspecified: Secondary | ICD-10-CM | POA: Diagnosis not present

## 2020-02-18 DIAGNOSIS — I5042 Chronic combined systolic (congestive) and diastolic (congestive) heart failure: Secondary | ICD-10-CM | POA: Diagnosis not present

## 2020-02-18 DIAGNOSIS — R55 Syncope and collapse: Secondary | ICD-10-CM | POA: Diagnosis not present

## 2020-02-18 DIAGNOSIS — R1111 Vomiting without nausea: Secondary | ICD-10-CM | POA: Diagnosis not present

## 2020-02-18 DIAGNOSIS — I252 Old myocardial infarction: Secondary | ICD-10-CM | POA: Diagnosis not present

## 2020-02-18 DIAGNOSIS — I959 Hypotension, unspecified: Secondary | ICD-10-CM | POA: Diagnosis not present

## 2020-02-18 DIAGNOSIS — Z6841 Body Mass Index (BMI) 40.0 and over, adult: Secondary | ICD-10-CM | POA: Diagnosis not present

## 2020-02-19 DIAGNOSIS — I5020 Unspecified systolic (congestive) heart failure: Secondary | ICD-10-CM | POA: Diagnosis not present

## 2020-02-19 DIAGNOSIS — R55 Syncope and collapse: Secondary | ICD-10-CM | POA: Diagnosis not present

## 2020-02-19 DIAGNOSIS — I959 Hypotension, unspecified: Secondary | ICD-10-CM | POA: Diagnosis not present

## 2020-02-19 DIAGNOSIS — I130 Hypertensive heart and chronic kidney disease with heart failure and stage 1 through stage 4 chronic kidney disease, or unspecified chronic kidney disease: Secondary | ICD-10-CM | POA: Diagnosis not present

## 2020-02-19 DIAGNOSIS — I429 Cardiomyopathy, unspecified: Secondary | ICD-10-CM | POA: Diagnosis not present

## 2020-02-20 DIAGNOSIS — Z7901 Long term (current) use of anticoagulants: Secondary | ICD-10-CM | POA: Diagnosis not present

## 2020-02-20 DIAGNOSIS — I447 Left bundle-branch block, unspecified: Secondary | ICD-10-CM | POA: Diagnosis not present

## 2020-02-20 DIAGNOSIS — R55 Syncope and collapse: Secondary | ICD-10-CM | POA: Diagnosis not present

## 2020-02-20 DIAGNOSIS — I081 Rheumatic disorders of both mitral and tricuspid valves: Secondary | ICD-10-CM | POA: Diagnosis not present

## 2020-02-20 DIAGNOSIS — I130 Hypertensive heart and chronic kidney disease with heart failure and stage 1 through stage 4 chronic kidney disease, or unspecified chronic kidney disease: Secondary | ICD-10-CM | POA: Diagnosis not present

## 2020-02-20 DIAGNOSIS — I5020 Unspecified systolic (congestive) heart failure: Secondary | ICD-10-CM | POA: Diagnosis not present

## 2020-02-20 DIAGNOSIS — I4892 Unspecified atrial flutter: Secondary | ICD-10-CM | POA: Diagnosis not present

## 2020-02-20 DIAGNOSIS — I77810 Thoracic aortic ectasia: Secondary | ICD-10-CM | POA: Diagnosis not present

## 2020-02-20 DIAGNOSIS — I959 Hypotension, unspecified: Secondary | ICD-10-CM | POA: Diagnosis not present

## 2020-02-20 DIAGNOSIS — I509 Heart failure, unspecified: Secondary | ICD-10-CM | POA: Diagnosis not present

## 2020-02-21 DIAGNOSIS — E162 Hypoglycemia, unspecified: Secondary | ICD-10-CM | POA: Diagnosis not present

## 2020-02-21 DIAGNOSIS — R55 Syncope and collapse: Secondary | ICD-10-CM | POA: Diagnosis not present

## 2020-02-21 DIAGNOSIS — Z7401 Bed confinement status: Secondary | ICD-10-CM | POA: Diagnosis not present

## 2020-02-21 DIAGNOSIS — I959 Hypotension, unspecified: Secondary | ICD-10-CM | POA: Diagnosis not present

## 2020-02-21 DIAGNOSIS — I5020 Unspecified systolic (congestive) heart failure: Secondary | ICD-10-CM | POA: Diagnosis not present

## 2020-02-21 DIAGNOSIS — E161 Other hypoglycemia: Secondary | ICD-10-CM | POA: Diagnosis not present

## 2020-02-21 DIAGNOSIS — I130 Hypertensive heart and chronic kidney disease with heart failure and stage 1 through stage 4 chronic kidney disease, or unspecified chronic kidney disease: Secondary | ICD-10-CM | POA: Diagnosis not present

## 2020-02-23 DIAGNOSIS — I5020 Unspecified systolic (congestive) heart failure: Secondary | ICD-10-CM | POA: Diagnosis not present

## 2020-02-23 DIAGNOSIS — N183 Chronic kidney disease, stage 3 unspecified: Secondary | ICD-10-CM | POA: Diagnosis not present

## 2020-02-23 DIAGNOSIS — E039 Hypothyroidism, unspecified: Secondary | ICD-10-CM | POA: Diagnosis not present

## 2020-02-23 DIAGNOSIS — D649 Anemia, unspecified: Secondary | ICD-10-CM | POA: Diagnosis not present

## 2020-02-24 DIAGNOSIS — F411 Generalized anxiety disorder: Secondary | ICD-10-CM | POA: Diagnosis not present

## 2020-02-24 DIAGNOSIS — L89893 Pressure ulcer of other site, stage 3: Secondary | ICD-10-CM | POA: Diagnosis not present

## 2020-02-24 DIAGNOSIS — L988 Other specified disorders of the skin and subcutaneous tissue: Secondary | ICD-10-CM | POA: Diagnosis not present

## 2020-02-24 DIAGNOSIS — Z515 Encounter for palliative care: Secondary | ICD-10-CM | POA: Diagnosis not present

## 2020-02-24 DIAGNOSIS — I10 Essential (primary) hypertension: Secondary | ICD-10-CM | POA: Diagnosis not present

## 2020-02-24 DIAGNOSIS — R634 Abnormal weight loss: Secondary | ICD-10-CM | POA: Diagnosis not present

## 2020-03-01 DIAGNOSIS — I480 Paroxysmal atrial fibrillation: Secondary | ICD-10-CM | POA: Diagnosis not present

## 2020-03-01 DIAGNOSIS — I10 Essential (primary) hypertension: Secondary | ICD-10-CM | POA: Diagnosis not present

## 2020-03-01 DIAGNOSIS — D649 Anemia, unspecified: Secondary | ICD-10-CM | POA: Diagnosis not present

## 2020-03-01 DIAGNOSIS — I5020 Unspecified systolic (congestive) heart failure: Secondary | ICD-10-CM | POA: Diagnosis not present

## 2020-03-02 DIAGNOSIS — I10 Essential (primary) hypertension: Secondary | ICD-10-CM | POA: Diagnosis not present

## 2020-03-02 DIAGNOSIS — I5022 Chronic systolic (congestive) heart failure: Secondary | ICD-10-CM | POA: Diagnosis not present

## 2020-03-02 DIAGNOSIS — I429 Cardiomyopathy, unspecified: Secondary | ICD-10-CM | POA: Diagnosis not present

## 2020-03-02 DIAGNOSIS — I4891 Unspecified atrial fibrillation: Secondary | ICD-10-CM | POA: Diagnosis not present

## 2020-03-03 DIAGNOSIS — D649 Anemia, unspecified: Secondary | ICD-10-CM | POA: Diagnosis not present

## 2020-03-03 DIAGNOSIS — I480 Paroxysmal atrial fibrillation: Secondary | ICD-10-CM | POA: Diagnosis not present

## 2020-03-03 DIAGNOSIS — N183 Chronic kidney disease, stage 3 unspecified: Secondary | ICD-10-CM | POA: Diagnosis not present

## 2020-03-03 DIAGNOSIS — I5020 Unspecified systolic (congestive) heart failure: Secondary | ICD-10-CM | POA: Diagnosis not present

## 2020-03-04 DIAGNOSIS — I959 Hypotension, unspecified: Secondary | ICD-10-CM | POA: Diagnosis not present

## 2020-03-04 DIAGNOSIS — D649 Anemia, unspecified: Secondary | ICD-10-CM | POA: Diagnosis not present

## 2020-03-04 DIAGNOSIS — Z743 Need for continuous supervision: Secondary | ICD-10-CM | POA: Diagnosis not present

## 2020-03-04 DIAGNOSIS — R531 Weakness: Secondary | ICD-10-CM | POA: Diagnosis not present

## 2020-03-04 DIAGNOSIS — R42 Dizziness and giddiness: Secondary | ICD-10-CM | POA: Diagnosis not present

## 2020-03-08 DIAGNOSIS — I480 Paroxysmal atrial fibrillation: Secondary | ICD-10-CM | POA: Diagnosis not present

## 2020-03-08 DIAGNOSIS — I10 Essential (primary) hypertension: Secondary | ICD-10-CM | POA: Diagnosis not present

## 2020-03-08 DIAGNOSIS — I5020 Unspecified systolic (congestive) heart failure: Secondary | ICD-10-CM | POA: Diagnosis not present

## 2020-03-08 DIAGNOSIS — D649 Anemia, unspecified: Secondary | ICD-10-CM | POA: Diagnosis not present

## 2020-03-09 DIAGNOSIS — I428 Other cardiomyopathies: Secondary | ICD-10-CM | POA: Diagnosis not present

## 2020-03-09 DIAGNOSIS — N183 Chronic kidney disease, stage 3 unspecified: Secondary | ICD-10-CM | POA: Diagnosis not present

## 2020-03-09 DIAGNOSIS — Z7901 Long term (current) use of anticoagulants: Secondary | ICD-10-CM | POA: Diagnosis not present

## 2020-03-09 DIAGNOSIS — R072 Precordial pain: Secondary | ICD-10-CM | POA: Diagnosis not present

## 2020-03-09 DIAGNOSIS — Z66 Do not resuscitate: Secondary | ICD-10-CM | POA: Diagnosis not present

## 2020-03-09 DIAGNOSIS — I4891 Unspecified atrial fibrillation: Secondary | ICD-10-CM | POA: Diagnosis not present

## 2020-03-09 DIAGNOSIS — R279 Unspecified lack of coordination: Secondary | ICD-10-CM | POA: Diagnosis not present

## 2020-03-09 DIAGNOSIS — Z20822 Contact with and (suspected) exposure to covid-19: Secondary | ICD-10-CM | POA: Diagnosis not present

## 2020-03-09 DIAGNOSIS — I129 Hypertensive chronic kidney disease with stage 1 through stage 4 chronic kidney disease, or unspecified chronic kidney disease: Secondary | ICD-10-CM | POA: Diagnosis not present

## 2020-03-09 DIAGNOSIS — K2950 Unspecified chronic gastritis without bleeding: Secondary | ICD-10-CM | POA: Diagnosis not present

## 2020-03-09 DIAGNOSIS — R410 Disorientation, unspecified: Secondary | ICD-10-CM | POA: Diagnosis not present

## 2020-03-09 DIAGNOSIS — I130 Hypertensive heart and chronic kidney disease with heart failure and stage 1 through stage 4 chronic kidney disease, or unspecified chronic kidney disease: Secondary | ICD-10-CM | POA: Diagnosis not present

## 2020-03-09 DIAGNOSIS — I472 Ventricular tachycardia: Secondary | ICD-10-CM | POA: Diagnosis not present

## 2020-03-09 DIAGNOSIS — Z7401 Bed confinement status: Secondary | ICD-10-CM | POA: Diagnosis not present

## 2020-03-09 DIAGNOSIS — I5022 Chronic systolic (congestive) heart failure: Secondary | ICD-10-CM | POA: Diagnosis not present

## 2020-03-09 DIAGNOSIS — K269 Duodenal ulcer, unspecified as acute or chronic, without hemorrhage or perforation: Secondary | ICD-10-CM | POA: Diagnosis not present

## 2020-03-09 DIAGNOSIS — Z515 Encounter for palliative care: Secondary | ICD-10-CM | POA: Diagnosis not present

## 2020-03-09 DIAGNOSIS — Z6841 Body Mass Index (BMI) 40.0 and over, adult: Secondary | ICD-10-CM | POA: Diagnosis not present

## 2020-03-09 DIAGNOSIS — R55 Syncope and collapse: Secondary | ICD-10-CM | POA: Diagnosis not present

## 2020-03-09 DIAGNOSIS — Z993 Dependence on wheelchair: Secondary | ICD-10-CM | POA: Diagnosis not present

## 2020-03-09 DIAGNOSIS — I2510 Atherosclerotic heart disease of native coronary artery without angina pectoris: Secondary | ICD-10-CM | POA: Diagnosis not present

## 2020-03-09 DIAGNOSIS — I493 Ventricular premature depolarization: Secondary | ICD-10-CM | POA: Diagnosis not present

## 2020-03-09 DIAGNOSIS — I5020 Unspecified systolic (congestive) heart failure: Secondary | ICD-10-CM | POA: Diagnosis not present

## 2020-03-09 DIAGNOSIS — I10 Essential (primary) hypertension: Secondary | ICD-10-CM | POA: Diagnosis not present

## 2020-03-09 DIAGNOSIS — I480 Paroxysmal atrial fibrillation: Secondary | ICD-10-CM | POA: Diagnosis not present

## 2020-03-09 DIAGNOSIS — I5042 Chronic combined systolic (congestive) and diastolic (congestive) heart failure: Secondary | ICD-10-CM | POA: Diagnosis not present

## 2020-03-09 DIAGNOSIS — K2270 Barrett's esophagus without dysplasia: Secondary | ICD-10-CM | POA: Diagnosis not present

## 2020-03-09 DIAGNOSIS — D509 Iron deficiency anemia, unspecified: Secondary | ICD-10-CM | POA: Diagnosis not present

## 2020-03-09 DIAGNOSIS — C61 Malignant neoplasm of prostate: Secondary | ICD-10-CM | POA: Diagnosis not present

## 2020-03-09 DIAGNOSIS — R4020 Unspecified coma: Secondary | ICD-10-CM | POA: Diagnosis not present

## 2020-03-09 DIAGNOSIS — I4892 Unspecified atrial flutter: Secondary | ICD-10-CM | POA: Diagnosis not present

## 2020-03-09 DIAGNOSIS — G4733 Obstructive sleep apnea (adult) (pediatric): Secondary | ICD-10-CM | POA: Diagnosis not present

## 2020-03-09 DIAGNOSIS — I951 Orthostatic hypotension: Secondary | ICD-10-CM | POA: Diagnosis not present

## 2020-03-09 DIAGNOSIS — K5730 Diverticulosis of large intestine without perforation or abscess without bleeding: Secondary | ICD-10-CM | POA: Diagnosis not present

## 2020-03-09 DIAGNOSIS — Z743 Need for continuous supervision: Secondary | ICD-10-CM | POA: Diagnosis not present

## 2020-03-09 DIAGNOSIS — I499 Cardiac arrhythmia, unspecified: Secondary | ICD-10-CM | POA: Diagnosis not present

## 2020-03-09 DIAGNOSIS — J9811 Atelectasis: Secondary | ICD-10-CM | POA: Diagnosis not present

## 2020-03-09 DIAGNOSIS — I429 Cardiomyopathy, unspecified: Secondary | ICD-10-CM | POA: Diagnosis not present

## 2020-03-17 DIAGNOSIS — I5020 Unspecified systolic (congestive) heart failure: Secondary | ICD-10-CM | POA: Diagnosis not present

## 2020-03-17 DIAGNOSIS — I951 Orthostatic hypotension: Secondary | ICD-10-CM | POA: Diagnosis not present

## 2020-03-17 DIAGNOSIS — D649 Anemia, unspecified: Secondary | ICD-10-CM | POA: Diagnosis not present

## 2020-03-17 DIAGNOSIS — K269 Duodenal ulcer, unspecified as acute or chronic, without hemorrhage or perforation: Secondary | ICD-10-CM | POA: Diagnosis not present

## 2020-03-22 DIAGNOSIS — I951 Orthostatic hypotension: Secondary | ICD-10-CM | POA: Diagnosis not present

## 2020-03-22 DIAGNOSIS — D649 Anemia, unspecified: Secondary | ICD-10-CM | POA: Diagnosis not present

## 2020-03-22 DIAGNOSIS — N183 Chronic kidney disease, stage 3 unspecified: Secondary | ICD-10-CM | POA: Diagnosis not present

## 2020-03-22 DIAGNOSIS — I5020 Unspecified systolic (congestive) heart failure: Secondary | ICD-10-CM | POA: Diagnosis not present

## 2020-03-23 DIAGNOSIS — E039 Hypothyroidism, unspecified: Secondary | ICD-10-CM | POA: Diagnosis not present

## 2020-03-25 DIAGNOSIS — N183 Chronic kidney disease, stage 3 unspecified: Secondary | ICD-10-CM | POA: Diagnosis not present

## 2020-03-25 DIAGNOSIS — I5020 Unspecified systolic (congestive) heart failure: Secondary | ICD-10-CM | POA: Diagnosis not present

## 2020-03-25 DIAGNOSIS — K269 Duodenal ulcer, unspecified as acute or chronic, without hemorrhage or perforation: Secondary | ICD-10-CM | POA: Diagnosis not present

## 2020-03-25 DIAGNOSIS — K5900 Constipation, unspecified: Secondary | ICD-10-CM | POA: Diagnosis not present

## 2020-03-31 DIAGNOSIS — I480 Paroxysmal atrial fibrillation: Secondary | ICD-10-CM | POA: Diagnosis not present

## 2020-03-31 DIAGNOSIS — I951 Orthostatic hypotension: Secondary | ICD-10-CM | POA: Diagnosis not present

## 2020-03-31 DIAGNOSIS — I5020 Unspecified systolic (congestive) heart failure: Secondary | ICD-10-CM | POA: Diagnosis not present

## 2020-03-31 DIAGNOSIS — I10 Essential (primary) hypertension: Secondary | ICD-10-CM | POA: Diagnosis not present

## 2020-04-06 DIAGNOSIS — D509 Iron deficiency anemia, unspecified: Secondary | ICD-10-CM | POA: Diagnosis not present

## 2020-04-06 DIAGNOSIS — Z515 Encounter for palliative care: Secondary | ICD-10-CM | POA: Diagnosis not present

## 2020-04-06 DIAGNOSIS — I5023 Acute on chronic systolic (congestive) heart failure: Secondary | ICD-10-CM | POA: Diagnosis not present

## 2020-04-06 DIAGNOSIS — N183 Chronic kidney disease, stage 3 unspecified: Secondary | ICD-10-CM | POA: Diagnosis not present

## 2020-04-06 DIAGNOSIS — I5022 Chronic systolic (congestive) heart failure: Secondary | ICD-10-CM | POA: Diagnosis not present

## 2020-04-06 DIAGNOSIS — I10 Essential (primary) hypertension: Secondary | ICD-10-CM | POA: Diagnosis not present

## 2020-04-07 DIAGNOSIS — I5020 Unspecified systolic (congestive) heart failure: Secondary | ICD-10-CM | POA: Diagnosis not present

## 2020-04-07 DIAGNOSIS — I480 Paroxysmal atrial fibrillation: Secondary | ICD-10-CM | POA: Diagnosis not present

## 2020-04-07 DIAGNOSIS — I951 Orthostatic hypotension: Secondary | ICD-10-CM | POA: Diagnosis not present

## 2020-04-07 DIAGNOSIS — I10 Essential (primary) hypertension: Secondary | ICD-10-CM | POA: Diagnosis not present

## 2020-04-19 DIAGNOSIS — I493 Ventricular premature depolarization: Secondary | ICD-10-CM | POA: Diagnosis not present

## 2020-04-19 DIAGNOSIS — I429 Cardiomyopathy, unspecified: Secondary | ICD-10-CM | POA: Diagnosis not present

## 2020-04-19 DIAGNOSIS — I472 Ventricular tachycardia: Secondary | ICD-10-CM | POA: Diagnosis not present

## 2020-04-19 DIAGNOSIS — I480 Paroxysmal atrial fibrillation: Secondary | ICD-10-CM | POA: Diagnosis not present

## 2020-04-19 DIAGNOSIS — I454 Nonspecific intraventricular block: Secondary | ICD-10-CM | POA: Diagnosis not present

## 2020-04-19 DIAGNOSIS — I951 Orthostatic hypotension: Secondary | ICD-10-CM | POA: Diagnosis not present

## 2020-04-19 DIAGNOSIS — I5020 Unspecified systolic (congestive) heart failure: Secondary | ICD-10-CM | POA: Diagnosis not present

## 2020-04-25 DIAGNOSIS — I509 Heart failure, unspecified: Secondary | ICD-10-CM | POA: Diagnosis not present

## 2020-04-25 DIAGNOSIS — D649 Anemia, unspecified: Secondary | ICD-10-CM | POA: Diagnosis not present

## 2020-04-26 DIAGNOSIS — I480 Paroxysmal atrial fibrillation: Secondary | ICD-10-CM | POA: Diagnosis not present

## 2020-04-26 DIAGNOSIS — I5020 Unspecified systolic (congestive) heart failure: Secondary | ICD-10-CM | POA: Diagnosis not present

## 2020-04-26 DIAGNOSIS — I10 Essential (primary) hypertension: Secondary | ICD-10-CM | POA: Diagnosis not present

## 2020-04-26 DIAGNOSIS — I951 Orthostatic hypotension: Secondary | ICD-10-CM | POA: Diagnosis not present

## 2020-05-03 DIAGNOSIS — I5020 Unspecified systolic (congestive) heart failure: Secondary | ICD-10-CM | POA: Diagnosis not present

## 2020-05-03 DIAGNOSIS — F329 Major depressive disorder, single episode, unspecified: Secondary | ICD-10-CM | POA: Diagnosis not present

## 2020-05-06 DIAGNOSIS — R262 Difficulty in walking, not elsewhere classified: Secondary | ICD-10-CM | POA: Diagnosis not present

## 2020-05-06 DIAGNOSIS — I5042 Chronic combined systolic (congestive) and diastolic (congestive) heart failure: Secondary | ICD-10-CM | POA: Diagnosis not present

## 2020-05-06 DIAGNOSIS — I951 Orthostatic hypotension: Secondary | ICD-10-CM | POA: Diagnosis not present

## 2020-05-09 DIAGNOSIS — I5042 Chronic combined systolic (congestive) and diastolic (congestive) heart failure: Secondary | ICD-10-CM | POA: Diagnosis not present

## 2020-05-09 DIAGNOSIS — R262 Difficulty in walking, not elsewhere classified: Secondary | ICD-10-CM | POA: Diagnosis not present

## 2020-05-09 DIAGNOSIS — I951 Orthostatic hypotension: Secondary | ICD-10-CM | POA: Diagnosis not present

## 2020-05-10 DIAGNOSIS — I5042 Chronic combined systolic (congestive) and diastolic (congestive) heart failure: Secondary | ICD-10-CM | POA: Diagnosis not present

## 2020-05-10 DIAGNOSIS — R262 Difficulty in walking, not elsewhere classified: Secondary | ICD-10-CM | POA: Diagnosis not present

## 2020-05-10 DIAGNOSIS — I951 Orthostatic hypotension: Secondary | ICD-10-CM | POA: Diagnosis not present

## 2020-05-10 DIAGNOSIS — F332 Major depressive disorder, recurrent severe without psychotic features: Secondary | ICD-10-CM | POA: Diagnosis not present

## 2020-05-10 DIAGNOSIS — R5381 Other malaise: Secondary | ICD-10-CM | POA: Diagnosis not present

## 2020-05-10 DIAGNOSIS — R4689 Other symptoms and signs involving appearance and behavior: Secondary | ICD-10-CM | POA: Diagnosis not present

## 2020-05-11 DIAGNOSIS — I5042 Chronic combined systolic (congestive) and diastolic (congestive) heart failure: Secondary | ICD-10-CM | POA: Diagnosis not present

## 2020-05-11 DIAGNOSIS — R262 Difficulty in walking, not elsewhere classified: Secondary | ICD-10-CM | POA: Diagnosis not present

## 2020-05-11 DIAGNOSIS — I951 Orthostatic hypotension: Secondary | ICD-10-CM | POA: Diagnosis not present

## 2020-05-12 DIAGNOSIS — I5042 Chronic combined systolic (congestive) and diastolic (congestive) heart failure: Secondary | ICD-10-CM | POA: Diagnosis not present

## 2020-05-12 DIAGNOSIS — R262 Difficulty in walking, not elsewhere classified: Secondary | ICD-10-CM | POA: Diagnosis not present

## 2020-05-12 DIAGNOSIS — I951 Orthostatic hypotension: Secondary | ICD-10-CM | POA: Diagnosis not present

## 2020-05-13 DIAGNOSIS — I951 Orthostatic hypotension: Secondary | ICD-10-CM | POA: Diagnosis not present

## 2020-05-13 DIAGNOSIS — R262 Difficulty in walking, not elsewhere classified: Secondary | ICD-10-CM | POA: Diagnosis not present

## 2020-05-13 DIAGNOSIS — I5042 Chronic combined systolic (congestive) and diastolic (congestive) heart failure: Secondary | ICD-10-CM | POA: Diagnosis not present

## 2020-05-16 DIAGNOSIS — I951 Orthostatic hypotension: Secondary | ICD-10-CM | POA: Diagnosis not present

## 2020-05-16 DIAGNOSIS — I5042 Chronic combined systolic (congestive) and diastolic (congestive) heart failure: Secondary | ICD-10-CM | POA: Diagnosis not present

## 2020-05-16 DIAGNOSIS — R262 Difficulty in walking, not elsewhere classified: Secondary | ICD-10-CM | POA: Diagnosis not present

## 2020-05-17 DIAGNOSIS — I5042 Chronic combined systolic (congestive) and diastolic (congestive) heart failure: Secondary | ICD-10-CM | POA: Diagnosis not present

## 2020-05-17 DIAGNOSIS — R262 Difficulty in walking, not elsewhere classified: Secondary | ICD-10-CM | POA: Diagnosis not present

## 2020-05-17 DIAGNOSIS — I951 Orthostatic hypotension: Secondary | ICD-10-CM | POA: Diagnosis not present

## 2020-05-18 DIAGNOSIS — I951 Orthostatic hypotension: Secondary | ICD-10-CM | POA: Diagnosis not present

## 2020-05-18 DIAGNOSIS — R262 Difficulty in walking, not elsewhere classified: Secondary | ICD-10-CM | POA: Diagnosis not present

## 2020-05-18 DIAGNOSIS — I5042 Chronic combined systolic (congestive) and diastolic (congestive) heart failure: Secondary | ICD-10-CM | POA: Diagnosis not present

## 2020-05-19 DIAGNOSIS — I951 Orthostatic hypotension: Secondary | ICD-10-CM | POA: Diagnosis not present

## 2020-05-19 DIAGNOSIS — I5042 Chronic combined systolic (congestive) and diastolic (congestive) heart failure: Secondary | ICD-10-CM | POA: Diagnosis not present

## 2020-05-19 DIAGNOSIS — R262 Difficulty in walking, not elsewhere classified: Secondary | ICD-10-CM | POA: Diagnosis not present

## 2020-05-23 DIAGNOSIS — J189 Pneumonia, unspecified organism: Secondary | ICD-10-CM | POA: Diagnosis not present

## 2020-05-23 DIAGNOSIS — R262 Difficulty in walking, not elsewhere classified: Secondary | ICD-10-CM | POA: Diagnosis not present

## 2020-05-23 DIAGNOSIS — R2689 Other abnormalities of gait and mobility: Secondary | ICD-10-CM | POA: Diagnosis not present

## 2020-05-23 DIAGNOSIS — I5032 Chronic diastolic (congestive) heart failure: Secondary | ICD-10-CM | POA: Diagnosis not present

## 2020-05-23 DIAGNOSIS — I951 Orthostatic hypotension: Secondary | ICD-10-CM | POA: Diagnosis not present

## 2020-05-23 DIAGNOSIS — I5042 Chronic combined systolic (congestive) and diastolic (congestive) heart failure: Secondary | ICD-10-CM | POA: Diagnosis not present

## 2020-05-23 DIAGNOSIS — M6281 Muscle weakness (generalized): Secondary | ICD-10-CM | POA: Diagnosis not present

## 2020-05-24 DIAGNOSIS — J189 Pneumonia, unspecified organism: Secondary | ICD-10-CM | POA: Diagnosis not present

## 2020-05-24 DIAGNOSIS — I951 Orthostatic hypotension: Secondary | ICD-10-CM | POA: Diagnosis not present

## 2020-05-24 DIAGNOSIS — R2689 Other abnormalities of gait and mobility: Secondary | ICD-10-CM | POA: Diagnosis not present

## 2020-05-24 DIAGNOSIS — I5042 Chronic combined systolic (congestive) and diastolic (congestive) heart failure: Secondary | ICD-10-CM | POA: Diagnosis not present

## 2020-05-24 DIAGNOSIS — I5032 Chronic diastolic (congestive) heart failure: Secondary | ICD-10-CM | POA: Diagnosis not present

## 2020-05-24 DIAGNOSIS — R262 Difficulty in walking, not elsewhere classified: Secondary | ICD-10-CM | POA: Diagnosis not present

## 2020-05-24 DIAGNOSIS — M6281 Muscle weakness (generalized): Secondary | ICD-10-CM | POA: Diagnosis not present

## 2020-05-25 DIAGNOSIS — M6281 Muscle weakness (generalized): Secondary | ICD-10-CM | POA: Diagnosis not present

## 2020-05-25 DIAGNOSIS — I5042 Chronic combined systolic (congestive) and diastolic (congestive) heart failure: Secondary | ICD-10-CM | POA: Diagnosis not present

## 2020-05-25 DIAGNOSIS — R2689 Other abnormalities of gait and mobility: Secondary | ICD-10-CM | POA: Diagnosis not present

## 2020-05-25 DIAGNOSIS — R262 Difficulty in walking, not elsewhere classified: Secondary | ICD-10-CM | POA: Diagnosis not present

## 2020-05-25 DIAGNOSIS — I951 Orthostatic hypotension: Secondary | ICD-10-CM | POA: Diagnosis not present

## 2020-05-25 DIAGNOSIS — I5032 Chronic diastolic (congestive) heart failure: Secondary | ICD-10-CM | POA: Diagnosis not present

## 2020-05-25 DIAGNOSIS — J189 Pneumonia, unspecified organism: Secondary | ICD-10-CM | POA: Diagnosis not present

## 2020-05-26 DIAGNOSIS — J189 Pneumonia, unspecified organism: Secondary | ICD-10-CM | POA: Diagnosis not present

## 2020-05-26 DIAGNOSIS — M6281 Muscle weakness (generalized): Secondary | ICD-10-CM | POA: Diagnosis not present

## 2020-05-26 DIAGNOSIS — I5042 Chronic combined systolic (congestive) and diastolic (congestive) heart failure: Secondary | ICD-10-CM | POA: Diagnosis not present

## 2020-05-26 DIAGNOSIS — R262 Difficulty in walking, not elsewhere classified: Secondary | ICD-10-CM | POA: Diagnosis not present

## 2020-05-26 DIAGNOSIS — R2689 Other abnormalities of gait and mobility: Secondary | ICD-10-CM | POA: Diagnosis not present

## 2020-05-26 DIAGNOSIS — I5032 Chronic diastolic (congestive) heart failure: Secondary | ICD-10-CM | POA: Diagnosis not present

## 2020-05-26 DIAGNOSIS — I951 Orthostatic hypotension: Secondary | ICD-10-CM | POA: Diagnosis not present

## 2020-05-27 DIAGNOSIS — J189 Pneumonia, unspecified organism: Secondary | ICD-10-CM | POA: Diagnosis not present

## 2020-05-27 DIAGNOSIS — R2689 Other abnormalities of gait and mobility: Secondary | ICD-10-CM | POA: Diagnosis not present

## 2020-05-27 DIAGNOSIS — Z515 Encounter for palliative care: Secondary | ICD-10-CM | POA: Diagnosis not present

## 2020-05-27 DIAGNOSIS — R262 Difficulty in walking, not elsewhere classified: Secondary | ICD-10-CM | POA: Diagnosis not present

## 2020-05-27 DIAGNOSIS — I5022 Chronic systolic (congestive) heart failure: Secondary | ICD-10-CM | POA: Diagnosis not present

## 2020-05-27 DIAGNOSIS — I10 Essential (primary) hypertension: Secondary | ICD-10-CM | POA: Diagnosis not present

## 2020-05-27 DIAGNOSIS — I951 Orthostatic hypotension: Secondary | ICD-10-CM | POA: Diagnosis not present

## 2020-05-27 DIAGNOSIS — I5042 Chronic combined systolic (congestive) and diastolic (congestive) heart failure: Secondary | ICD-10-CM | POA: Diagnosis not present

## 2020-05-27 DIAGNOSIS — I5032 Chronic diastolic (congestive) heart failure: Secondary | ICD-10-CM | POA: Diagnosis not present

## 2020-05-27 DIAGNOSIS — N183 Chronic kidney disease, stage 3 unspecified: Secondary | ICD-10-CM | POA: Diagnosis not present

## 2020-05-27 DIAGNOSIS — M6281 Muscle weakness (generalized): Secondary | ICD-10-CM | POA: Diagnosis not present

## 2020-05-30 DIAGNOSIS — I951 Orthostatic hypotension: Secondary | ICD-10-CM | POA: Diagnosis not present

## 2020-05-30 DIAGNOSIS — R262 Difficulty in walking, not elsewhere classified: Secondary | ICD-10-CM | POA: Diagnosis not present

## 2020-05-30 DIAGNOSIS — I5032 Chronic diastolic (congestive) heart failure: Secondary | ICD-10-CM | POA: Diagnosis not present

## 2020-05-30 DIAGNOSIS — R2689 Other abnormalities of gait and mobility: Secondary | ICD-10-CM | POA: Diagnosis not present

## 2020-05-30 DIAGNOSIS — J189 Pneumonia, unspecified organism: Secondary | ICD-10-CM | POA: Diagnosis not present

## 2020-05-30 DIAGNOSIS — I5042 Chronic combined systolic (congestive) and diastolic (congestive) heart failure: Secondary | ICD-10-CM | POA: Diagnosis not present

## 2020-05-30 DIAGNOSIS — M6281 Muscle weakness (generalized): Secondary | ICD-10-CM | POA: Diagnosis not present

## 2020-05-31 DIAGNOSIS — I951 Orthostatic hypotension: Secondary | ICD-10-CM | POA: Diagnosis not present

## 2020-05-31 DIAGNOSIS — J189 Pneumonia, unspecified organism: Secondary | ICD-10-CM | POA: Diagnosis not present

## 2020-05-31 DIAGNOSIS — I5042 Chronic combined systolic (congestive) and diastolic (congestive) heart failure: Secondary | ICD-10-CM | POA: Diagnosis not present

## 2020-05-31 DIAGNOSIS — I5032 Chronic diastolic (congestive) heart failure: Secondary | ICD-10-CM | POA: Diagnosis not present

## 2020-05-31 DIAGNOSIS — R2689 Other abnormalities of gait and mobility: Secondary | ICD-10-CM | POA: Diagnosis not present

## 2020-05-31 DIAGNOSIS — M6281 Muscle weakness (generalized): Secondary | ICD-10-CM | POA: Diagnosis not present

## 2020-05-31 DIAGNOSIS — R262 Difficulty in walking, not elsewhere classified: Secondary | ICD-10-CM | POA: Diagnosis not present

## 2020-06-01 DIAGNOSIS — I951 Orthostatic hypotension: Secondary | ICD-10-CM | POA: Diagnosis not present

## 2020-06-01 DIAGNOSIS — R2689 Other abnormalities of gait and mobility: Secondary | ICD-10-CM | POA: Diagnosis not present

## 2020-06-01 DIAGNOSIS — I5042 Chronic combined systolic (congestive) and diastolic (congestive) heart failure: Secondary | ICD-10-CM | POA: Diagnosis not present

## 2020-06-01 DIAGNOSIS — R262 Difficulty in walking, not elsewhere classified: Secondary | ICD-10-CM | POA: Diagnosis not present

## 2020-06-01 DIAGNOSIS — M6281 Muscle weakness (generalized): Secondary | ICD-10-CM | POA: Diagnosis not present

## 2020-06-01 DIAGNOSIS — I5032 Chronic diastolic (congestive) heart failure: Secondary | ICD-10-CM | POA: Diagnosis not present

## 2020-06-01 DIAGNOSIS — J189 Pneumonia, unspecified organism: Secondary | ICD-10-CM | POA: Diagnosis not present

## 2020-06-03 DIAGNOSIS — I5042 Chronic combined systolic (congestive) and diastolic (congestive) heart failure: Secondary | ICD-10-CM | POA: Diagnosis not present

## 2020-06-03 DIAGNOSIS — I951 Orthostatic hypotension: Secondary | ICD-10-CM | POA: Diagnosis not present

## 2020-06-03 DIAGNOSIS — M6281 Muscle weakness (generalized): Secondary | ICD-10-CM | POA: Diagnosis not present

## 2020-06-03 DIAGNOSIS — R2689 Other abnormalities of gait and mobility: Secondary | ICD-10-CM | POA: Diagnosis not present

## 2020-06-03 DIAGNOSIS — J189 Pneumonia, unspecified organism: Secondary | ICD-10-CM | POA: Diagnosis not present

## 2020-06-03 DIAGNOSIS — I5032 Chronic diastolic (congestive) heart failure: Secondary | ICD-10-CM | POA: Diagnosis not present

## 2020-06-03 DIAGNOSIS — R262 Difficulty in walking, not elsewhere classified: Secondary | ICD-10-CM | POA: Diagnosis not present

## 2020-06-04 DIAGNOSIS — R2689 Other abnormalities of gait and mobility: Secondary | ICD-10-CM | POA: Diagnosis not present

## 2020-06-04 DIAGNOSIS — J189 Pneumonia, unspecified organism: Secondary | ICD-10-CM | POA: Diagnosis not present

## 2020-06-04 DIAGNOSIS — M6281 Muscle weakness (generalized): Secondary | ICD-10-CM | POA: Diagnosis not present

## 2020-06-04 DIAGNOSIS — I5032 Chronic diastolic (congestive) heart failure: Secondary | ICD-10-CM | POA: Diagnosis not present

## 2020-06-04 DIAGNOSIS — I5042 Chronic combined systolic (congestive) and diastolic (congestive) heart failure: Secondary | ICD-10-CM | POA: Diagnosis not present

## 2020-06-04 DIAGNOSIS — I951 Orthostatic hypotension: Secondary | ICD-10-CM | POA: Diagnosis not present

## 2020-06-04 DIAGNOSIS — R262 Difficulty in walking, not elsewhere classified: Secondary | ICD-10-CM | POA: Diagnosis not present

## 2020-06-06 DIAGNOSIS — I951 Orthostatic hypotension: Secondary | ICD-10-CM | POA: Diagnosis not present

## 2020-06-06 DIAGNOSIS — J189 Pneumonia, unspecified organism: Secondary | ICD-10-CM | POA: Diagnosis not present

## 2020-06-06 DIAGNOSIS — R2689 Other abnormalities of gait and mobility: Secondary | ICD-10-CM | POA: Diagnosis not present

## 2020-06-06 DIAGNOSIS — I5032 Chronic diastolic (congestive) heart failure: Secondary | ICD-10-CM | POA: Diagnosis not present

## 2020-06-06 DIAGNOSIS — R262 Difficulty in walking, not elsewhere classified: Secondary | ICD-10-CM | POA: Diagnosis not present

## 2020-06-06 DIAGNOSIS — M6281 Muscle weakness (generalized): Secondary | ICD-10-CM | POA: Diagnosis not present

## 2020-06-06 DIAGNOSIS — I5042 Chronic combined systolic (congestive) and diastolic (congestive) heart failure: Secondary | ICD-10-CM | POA: Diagnosis not present

## 2020-06-07 DIAGNOSIS — J189 Pneumonia, unspecified organism: Secondary | ICD-10-CM | POA: Diagnosis not present

## 2020-06-07 DIAGNOSIS — R262 Difficulty in walking, not elsewhere classified: Secondary | ICD-10-CM | POA: Diagnosis not present

## 2020-06-07 DIAGNOSIS — I951 Orthostatic hypotension: Secondary | ICD-10-CM | POA: Diagnosis not present

## 2020-06-07 DIAGNOSIS — I5032 Chronic diastolic (congestive) heart failure: Secondary | ICD-10-CM | POA: Diagnosis not present

## 2020-06-07 DIAGNOSIS — I5042 Chronic combined systolic (congestive) and diastolic (congestive) heart failure: Secondary | ICD-10-CM | POA: Diagnosis not present

## 2020-06-07 DIAGNOSIS — M6281 Muscle weakness (generalized): Secondary | ICD-10-CM | POA: Diagnosis not present

## 2020-06-07 DIAGNOSIS — R2689 Other abnormalities of gait and mobility: Secondary | ICD-10-CM | POA: Diagnosis not present

## 2020-06-08 DIAGNOSIS — R2689 Other abnormalities of gait and mobility: Secondary | ICD-10-CM | POA: Diagnosis not present

## 2020-06-08 DIAGNOSIS — J189 Pneumonia, unspecified organism: Secondary | ICD-10-CM | POA: Diagnosis not present

## 2020-06-08 DIAGNOSIS — I951 Orthostatic hypotension: Secondary | ICD-10-CM | POA: Diagnosis not present

## 2020-06-08 DIAGNOSIS — I5032 Chronic diastolic (congestive) heart failure: Secondary | ICD-10-CM | POA: Diagnosis not present

## 2020-06-08 DIAGNOSIS — R262 Difficulty in walking, not elsewhere classified: Secondary | ICD-10-CM | POA: Diagnosis not present

## 2020-06-08 DIAGNOSIS — I5042 Chronic combined systolic (congestive) and diastolic (congestive) heart failure: Secondary | ICD-10-CM | POA: Diagnosis not present

## 2020-06-08 DIAGNOSIS — M6281 Muscle weakness (generalized): Secondary | ICD-10-CM | POA: Diagnosis not present

## 2020-06-09 DIAGNOSIS — I5042 Chronic combined systolic (congestive) and diastolic (congestive) heart failure: Secondary | ICD-10-CM | POA: Diagnosis not present

## 2020-06-09 DIAGNOSIS — R2689 Other abnormalities of gait and mobility: Secondary | ICD-10-CM | POA: Diagnosis not present

## 2020-06-09 DIAGNOSIS — M6281 Muscle weakness (generalized): Secondary | ICD-10-CM | POA: Diagnosis not present

## 2020-06-09 DIAGNOSIS — J189 Pneumonia, unspecified organism: Secondary | ICD-10-CM | POA: Diagnosis not present

## 2020-06-09 DIAGNOSIS — I5032 Chronic diastolic (congestive) heart failure: Secondary | ICD-10-CM | POA: Diagnosis not present

## 2020-06-09 DIAGNOSIS — R262 Difficulty in walking, not elsewhere classified: Secondary | ICD-10-CM | POA: Diagnosis not present

## 2020-06-09 DIAGNOSIS — I951 Orthostatic hypotension: Secondary | ICD-10-CM | POA: Diagnosis not present

## 2020-06-10 DIAGNOSIS — R2689 Other abnormalities of gait and mobility: Secondary | ICD-10-CM | POA: Diagnosis not present

## 2020-06-10 DIAGNOSIS — I5020 Unspecified systolic (congestive) heart failure: Secondary | ICD-10-CM | POA: Diagnosis not present

## 2020-06-10 DIAGNOSIS — I10 Essential (primary) hypertension: Secondary | ICD-10-CM | POA: Diagnosis not present

## 2020-06-10 DIAGNOSIS — I5042 Chronic combined systolic (congestive) and diastolic (congestive) heart failure: Secondary | ICD-10-CM | POA: Diagnosis not present

## 2020-06-10 DIAGNOSIS — E039 Hypothyroidism, unspecified: Secondary | ICD-10-CM | POA: Diagnosis not present

## 2020-06-10 DIAGNOSIS — R262 Difficulty in walking, not elsewhere classified: Secondary | ICD-10-CM | POA: Diagnosis not present

## 2020-06-10 DIAGNOSIS — I951 Orthostatic hypotension: Secondary | ICD-10-CM | POA: Diagnosis not present

## 2020-06-10 DIAGNOSIS — M6281 Muscle weakness (generalized): Secondary | ICD-10-CM | POA: Diagnosis not present

## 2020-06-10 DIAGNOSIS — I5032 Chronic diastolic (congestive) heart failure: Secondary | ICD-10-CM | POA: Diagnosis not present

## 2020-06-10 DIAGNOSIS — J189 Pneumonia, unspecified organism: Secondary | ICD-10-CM | POA: Diagnosis not present

## 2020-06-13 DIAGNOSIS — I5042 Chronic combined systolic (congestive) and diastolic (congestive) heart failure: Secondary | ICD-10-CM | POA: Diagnosis not present

## 2020-06-13 DIAGNOSIS — J189 Pneumonia, unspecified organism: Secondary | ICD-10-CM | POA: Diagnosis not present

## 2020-06-13 DIAGNOSIS — I951 Orthostatic hypotension: Secondary | ICD-10-CM | POA: Diagnosis not present

## 2020-06-13 DIAGNOSIS — R2689 Other abnormalities of gait and mobility: Secondary | ICD-10-CM | POA: Diagnosis not present

## 2020-06-13 DIAGNOSIS — I509 Heart failure, unspecified: Secondary | ICD-10-CM | POA: Diagnosis not present

## 2020-06-13 DIAGNOSIS — I5032 Chronic diastolic (congestive) heart failure: Secondary | ICD-10-CM | POA: Diagnosis not present

## 2020-06-13 DIAGNOSIS — M6281 Muscle weakness (generalized): Secondary | ICD-10-CM | POA: Diagnosis not present

## 2020-06-13 DIAGNOSIS — R262 Difficulty in walking, not elsewhere classified: Secondary | ICD-10-CM | POA: Diagnosis not present

## 2020-06-14 DIAGNOSIS — I10 Essential (primary) hypertension: Secondary | ICD-10-CM | POA: Diagnosis not present

## 2020-06-14 DIAGNOSIS — M6281 Muscle weakness (generalized): Secondary | ICD-10-CM | POA: Diagnosis not present

## 2020-06-14 DIAGNOSIS — J189 Pneumonia, unspecified organism: Secondary | ICD-10-CM | POA: Diagnosis not present

## 2020-06-14 DIAGNOSIS — I5020 Unspecified systolic (congestive) heart failure: Secondary | ICD-10-CM | POA: Diagnosis not present

## 2020-06-14 DIAGNOSIS — I5032 Chronic diastolic (congestive) heart failure: Secondary | ICD-10-CM | POA: Diagnosis not present

## 2020-06-14 DIAGNOSIS — I5042 Chronic combined systolic (congestive) and diastolic (congestive) heart failure: Secondary | ICD-10-CM | POA: Diagnosis not present

## 2020-06-14 DIAGNOSIS — I480 Paroxysmal atrial fibrillation: Secondary | ICD-10-CM | POA: Diagnosis not present

## 2020-06-14 DIAGNOSIS — E039 Hypothyroidism, unspecified: Secondary | ICD-10-CM | POA: Diagnosis not present

## 2020-06-14 DIAGNOSIS — R2689 Other abnormalities of gait and mobility: Secondary | ICD-10-CM | POA: Diagnosis not present

## 2020-06-14 DIAGNOSIS — R262 Difficulty in walking, not elsewhere classified: Secondary | ICD-10-CM | POA: Diagnosis not present

## 2020-06-14 DIAGNOSIS — I951 Orthostatic hypotension: Secondary | ICD-10-CM | POA: Diagnosis not present

## 2020-06-15 DIAGNOSIS — I5032 Chronic diastolic (congestive) heart failure: Secondary | ICD-10-CM | POA: Diagnosis not present

## 2020-06-15 DIAGNOSIS — I5042 Chronic combined systolic (congestive) and diastolic (congestive) heart failure: Secondary | ICD-10-CM | POA: Diagnosis not present

## 2020-06-15 DIAGNOSIS — R2689 Other abnormalities of gait and mobility: Secondary | ICD-10-CM | POA: Diagnosis not present

## 2020-06-15 DIAGNOSIS — I951 Orthostatic hypotension: Secondary | ICD-10-CM | POA: Diagnosis not present

## 2020-06-15 DIAGNOSIS — J189 Pneumonia, unspecified organism: Secondary | ICD-10-CM | POA: Diagnosis not present

## 2020-06-15 DIAGNOSIS — M6281 Muscle weakness (generalized): Secondary | ICD-10-CM | POA: Diagnosis not present

## 2020-06-15 DIAGNOSIS — R262 Difficulty in walking, not elsewhere classified: Secondary | ICD-10-CM | POA: Diagnosis not present

## 2020-06-16 DIAGNOSIS — R262 Difficulty in walking, not elsewhere classified: Secondary | ICD-10-CM | POA: Diagnosis not present

## 2020-06-16 DIAGNOSIS — I5042 Chronic combined systolic (congestive) and diastolic (congestive) heart failure: Secondary | ICD-10-CM | POA: Diagnosis not present

## 2020-06-16 DIAGNOSIS — J189 Pneumonia, unspecified organism: Secondary | ICD-10-CM | POA: Diagnosis not present

## 2020-06-16 DIAGNOSIS — R2689 Other abnormalities of gait and mobility: Secondary | ICD-10-CM | POA: Diagnosis not present

## 2020-06-16 DIAGNOSIS — I5032 Chronic diastolic (congestive) heart failure: Secondary | ICD-10-CM | POA: Diagnosis not present

## 2020-06-16 DIAGNOSIS — M6281 Muscle weakness (generalized): Secondary | ICD-10-CM | POA: Diagnosis not present

## 2020-06-16 DIAGNOSIS — I951 Orthostatic hypotension: Secondary | ICD-10-CM | POA: Diagnosis not present

## 2020-06-17 DIAGNOSIS — R262 Difficulty in walking, not elsewhere classified: Secondary | ICD-10-CM | POA: Diagnosis not present

## 2020-06-17 DIAGNOSIS — I5042 Chronic combined systolic (congestive) and diastolic (congestive) heart failure: Secondary | ICD-10-CM | POA: Diagnosis not present

## 2020-06-17 DIAGNOSIS — M6281 Muscle weakness (generalized): Secondary | ICD-10-CM | POA: Diagnosis not present

## 2020-06-17 DIAGNOSIS — J189 Pneumonia, unspecified organism: Secondary | ICD-10-CM | POA: Diagnosis not present

## 2020-06-17 DIAGNOSIS — R2689 Other abnormalities of gait and mobility: Secondary | ICD-10-CM | POA: Diagnosis not present

## 2020-06-17 DIAGNOSIS — I951 Orthostatic hypotension: Secondary | ICD-10-CM | POA: Diagnosis not present

## 2020-06-17 DIAGNOSIS — I5032 Chronic diastolic (congestive) heart failure: Secondary | ICD-10-CM | POA: Diagnosis not present

## 2020-06-21 DIAGNOSIS — I5032 Chronic diastolic (congestive) heart failure: Secondary | ICD-10-CM | POA: Diagnosis not present

## 2020-06-21 DIAGNOSIS — J189 Pneumonia, unspecified organism: Secondary | ICD-10-CM | POA: Diagnosis not present

## 2020-06-21 DIAGNOSIS — I951 Orthostatic hypotension: Secondary | ICD-10-CM | POA: Diagnosis not present

## 2020-06-21 DIAGNOSIS — R2689 Other abnormalities of gait and mobility: Secondary | ICD-10-CM | POA: Diagnosis not present

## 2020-06-21 DIAGNOSIS — I5042 Chronic combined systolic (congestive) and diastolic (congestive) heart failure: Secondary | ICD-10-CM | POA: Diagnosis not present

## 2020-06-21 DIAGNOSIS — M6281 Muscle weakness (generalized): Secondary | ICD-10-CM | POA: Diagnosis not present

## 2020-06-21 DIAGNOSIS — R262 Difficulty in walking, not elsewhere classified: Secondary | ICD-10-CM | POA: Diagnosis not present

## 2020-06-22 DIAGNOSIS — M6281 Muscle weakness (generalized): Secondary | ICD-10-CM | POA: Diagnosis not present

## 2020-06-22 DIAGNOSIS — I429 Cardiomyopathy, unspecified: Secondary | ICD-10-CM | POA: Diagnosis not present

## 2020-06-22 DIAGNOSIS — R262 Difficulty in walking, not elsewhere classified: Secondary | ICD-10-CM | POA: Diagnosis not present

## 2020-06-22 DIAGNOSIS — Z48812 Encounter for surgical aftercare following surgery on the circulatory system: Secondary | ICD-10-CM | POA: Diagnosis not present

## 2020-06-23 DIAGNOSIS — Z48812 Encounter for surgical aftercare following surgery on the circulatory system: Secondary | ICD-10-CM | POA: Diagnosis not present

## 2020-06-23 DIAGNOSIS — M6281 Muscle weakness (generalized): Secondary | ICD-10-CM | POA: Diagnosis not present

## 2020-06-23 DIAGNOSIS — I429 Cardiomyopathy, unspecified: Secondary | ICD-10-CM | POA: Diagnosis not present

## 2020-06-23 DIAGNOSIS — R262 Difficulty in walking, not elsewhere classified: Secondary | ICD-10-CM | POA: Diagnosis not present

## 2020-06-24 DIAGNOSIS — I429 Cardiomyopathy, unspecified: Secondary | ICD-10-CM | POA: Diagnosis not present

## 2020-06-24 DIAGNOSIS — Z48812 Encounter for surgical aftercare following surgery on the circulatory system: Secondary | ICD-10-CM | POA: Diagnosis not present

## 2020-06-24 DIAGNOSIS — M6281 Muscle weakness (generalized): Secondary | ICD-10-CM | POA: Diagnosis not present

## 2020-06-24 DIAGNOSIS — R262 Difficulty in walking, not elsewhere classified: Secondary | ICD-10-CM | POA: Diagnosis not present

## 2020-06-27 DIAGNOSIS — R262 Difficulty in walking, not elsewhere classified: Secondary | ICD-10-CM | POA: Diagnosis not present

## 2020-06-27 DIAGNOSIS — Z48812 Encounter for surgical aftercare following surgery on the circulatory system: Secondary | ICD-10-CM | POA: Diagnosis not present

## 2020-06-27 DIAGNOSIS — M6281 Muscle weakness (generalized): Secondary | ICD-10-CM | POA: Diagnosis not present

## 2020-06-27 DIAGNOSIS — I429 Cardiomyopathy, unspecified: Secondary | ICD-10-CM | POA: Diagnosis not present

## 2020-06-29 DIAGNOSIS — I429 Cardiomyopathy, unspecified: Secondary | ICD-10-CM | POA: Diagnosis not present

## 2020-06-29 DIAGNOSIS — M6281 Muscle weakness (generalized): Secondary | ICD-10-CM | POA: Diagnosis not present

## 2020-06-29 DIAGNOSIS — R262 Difficulty in walking, not elsewhere classified: Secondary | ICD-10-CM | POA: Diagnosis not present

## 2020-06-29 DIAGNOSIS — Z48812 Encounter for surgical aftercare following surgery on the circulatory system: Secondary | ICD-10-CM | POA: Diagnosis not present

## 2020-06-30 DIAGNOSIS — Z48812 Encounter for surgical aftercare following surgery on the circulatory system: Secondary | ICD-10-CM | POA: Diagnosis not present

## 2020-06-30 DIAGNOSIS — I429 Cardiomyopathy, unspecified: Secondary | ICD-10-CM | POA: Diagnosis not present

## 2020-06-30 DIAGNOSIS — R262 Difficulty in walking, not elsewhere classified: Secondary | ICD-10-CM | POA: Diagnosis not present

## 2020-06-30 DIAGNOSIS — M6281 Muscle weakness (generalized): Secondary | ICD-10-CM | POA: Diagnosis not present

## 2020-07-01 DIAGNOSIS — M6281 Muscle weakness (generalized): Secondary | ICD-10-CM | POA: Diagnosis not present

## 2020-07-01 DIAGNOSIS — I429 Cardiomyopathy, unspecified: Secondary | ICD-10-CM | POA: Diagnosis not present

## 2020-07-01 DIAGNOSIS — R262 Difficulty in walking, not elsewhere classified: Secondary | ICD-10-CM | POA: Diagnosis not present

## 2020-07-01 DIAGNOSIS — Z48812 Encounter for surgical aftercare following surgery on the circulatory system: Secondary | ICD-10-CM | POA: Diagnosis not present

## 2020-07-04 DIAGNOSIS — I429 Cardiomyopathy, unspecified: Secondary | ICD-10-CM | POA: Diagnosis not present

## 2020-07-04 DIAGNOSIS — M6281 Muscle weakness (generalized): Secondary | ICD-10-CM | POA: Diagnosis not present

## 2020-07-04 DIAGNOSIS — R262 Difficulty in walking, not elsewhere classified: Secondary | ICD-10-CM | POA: Diagnosis not present

## 2020-07-04 DIAGNOSIS — Z48812 Encounter for surgical aftercare following surgery on the circulatory system: Secondary | ICD-10-CM | POA: Diagnosis not present

## 2020-07-05 DIAGNOSIS — R262 Difficulty in walking, not elsewhere classified: Secondary | ICD-10-CM | POA: Diagnosis not present

## 2020-07-05 DIAGNOSIS — Z48812 Encounter for surgical aftercare following surgery on the circulatory system: Secondary | ICD-10-CM | POA: Diagnosis not present

## 2020-07-05 DIAGNOSIS — I429 Cardiomyopathy, unspecified: Secondary | ICD-10-CM | POA: Diagnosis not present

## 2020-07-05 DIAGNOSIS — M6281 Muscle weakness (generalized): Secondary | ICD-10-CM | POA: Diagnosis not present

## 2020-07-06 DIAGNOSIS — M6281 Muscle weakness (generalized): Secondary | ICD-10-CM | POA: Diagnosis not present

## 2020-07-06 DIAGNOSIS — R262 Difficulty in walking, not elsewhere classified: Secondary | ICD-10-CM | POA: Diagnosis not present

## 2020-07-06 DIAGNOSIS — I429 Cardiomyopathy, unspecified: Secondary | ICD-10-CM | POA: Diagnosis not present

## 2020-07-06 DIAGNOSIS — Z48812 Encounter for surgical aftercare following surgery on the circulatory system: Secondary | ICD-10-CM | POA: Diagnosis not present

## 2020-07-07 DIAGNOSIS — R262 Difficulty in walking, not elsewhere classified: Secondary | ICD-10-CM | POA: Diagnosis not present

## 2020-07-07 DIAGNOSIS — M6281 Muscle weakness (generalized): Secondary | ICD-10-CM | POA: Diagnosis not present

## 2020-07-07 DIAGNOSIS — I429 Cardiomyopathy, unspecified: Secondary | ICD-10-CM | POA: Diagnosis not present

## 2020-07-07 DIAGNOSIS — Z48812 Encounter for surgical aftercare following surgery on the circulatory system: Secondary | ICD-10-CM | POA: Diagnosis not present

## 2020-07-08 DIAGNOSIS — R262 Difficulty in walking, not elsewhere classified: Secondary | ICD-10-CM | POA: Diagnosis not present

## 2020-07-08 DIAGNOSIS — M6281 Muscle weakness (generalized): Secondary | ICD-10-CM | POA: Diagnosis not present

## 2020-07-08 DIAGNOSIS — I429 Cardiomyopathy, unspecified: Secondary | ICD-10-CM | POA: Diagnosis not present

## 2020-07-08 DIAGNOSIS — Z48812 Encounter for surgical aftercare following surgery on the circulatory system: Secondary | ICD-10-CM | POA: Diagnosis not present

## 2020-07-11 DIAGNOSIS — I429 Cardiomyopathy, unspecified: Secondary | ICD-10-CM | POA: Diagnosis not present

## 2020-07-11 DIAGNOSIS — Z48812 Encounter for surgical aftercare following surgery on the circulatory system: Secondary | ICD-10-CM | POA: Diagnosis not present

## 2020-07-11 DIAGNOSIS — M6281 Muscle weakness (generalized): Secondary | ICD-10-CM | POA: Diagnosis not present

## 2020-07-11 DIAGNOSIS — R262 Difficulty in walking, not elsewhere classified: Secondary | ICD-10-CM | POA: Diagnosis not present

## 2020-07-12 DIAGNOSIS — R262 Difficulty in walking, not elsewhere classified: Secondary | ICD-10-CM | POA: Diagnosis not present

## 2020-07-12 DIAGNOSIS — M6281 Muscle weakness (generalized): Secondary | ICD-10-CM | POA: Diagnosis not present

## 2020-07-12 DIAGNOSIS — I429 Cardiomyopathy, unspecified: Secondary | ICD-10-CM | POA: Diagnosis not present

## 2020-07-12 DIAGNOSIS — Z48812 Encounter for surgical aftercare following surgery on the circulatory system: Secondary | ICD-10-CM | POA: Diagnosis not present

## 2020-07-13 DIAGNOSIS — I429 Cardiomyopathy, unspecified: Secondary | ICD-10-CM | POA: Diagnosis not present

## 2020-07-13 DIAGNOSIS — Z48812 Encounter for surgical aftercare following surgery on the circulatory system: Secondary | ICD-10-CM | POA: Diagnosis not present

## 2020-07-13 DIAGNOSIS — M6281 Muscle weakness (generalized): Secondary | ICD-10-CM | POA: Diagnosis not present

## 2020-07-13 DIAGNOSIS — R262 Difficulty in walking, not elsewhere classified: Secondary | ICD-10-CM | POA: Diagnosis not present

## 2020-07-14 DIAGNOSIS — M6281 Muscle weakness (generalized): Secondary | ICD-10-CM | POA: Diagnosis not present

## 2020-07-14 DIAGNOSIS — R262 Difficulty in walking, not elsewhere classified: Secondary | ICD-10-CM | POA: Diagnosis not present

## 2020-07-14 DIAGNOSIS — I429 Cardiomyopathy, unspecified: Secondary | ICD-10-CM | POA: Diagnosis not present

## 2020-07-14 DIAGNOSIS — Z48812 Encounter for surgical aftercare following surgery on the circulatory system: Secondary | ICD-10-CM | POA: Diagnosis not present

## 2020-07-15 DIAGNOSIS — M6281 Muscle weakness (generalized): Secondary | ICD-10-CM | POA: Diagnosis not present

## 2020-07-15 DIAGNOSIS — D649 Anemia, unspecified: Secondary | ICD-10-CM | POA: Diagnosis not present

## 2020-07-15 DIAGNOSIS — I5020 Unspecified systolic (congestive) heart failure: Secondary | ICD-10-CM | POA: Diagnosis not present

## 2020-07-15 DIAGNOSIS — I429 Cardiomyopathy, unspecified: Secondary | ICD-10-CM | POA: Diagnosis not present

## 2020-07-15 DIAGNOSIS — I10 Essential (primary) hypertension: Secondary | ICD-10-CM | POA: Diagnosis not present

## 2020-07-15 DIAGNOSIS — I480 Paroxysmal atrial fibrillation: Secondary | ICD-10-CM | POA: Diagnosis not present

## 2020-07-15 DIAGNOSIS — R262 Difficulty in walking, not elsewhere classified: Secondary | ICD-10-CM | POA: Diagnosis not present

## 2020-07-15 DIAGNOSIS — Z48812 Encounter for surgical aftercare following surgery on the circulatory system: Secondary | ICD-10-CM | POA: Diagnosis not present

## 2020-07-18 DIAGNOSIS — D649 Anemia, unspecified: Secondary | ICD-10-CM | POA: Diagnosis not present

## 2020-07-18 DIAGNOSIS — R262 Difficulty in walking, not elsewhere classified: Secondary | ICD-10-CM | POA: Diagnosis not present

## 2020-07-18 DIAGNOSIS — Z48812 Encounter for surgical aftercare following surgery on the circulatory system: Secondary | ICD-10-CM | POA: Diagnosis not present

## 2020-07-18 DIAGNOSIS — M6281 Muscle weakness (generalized): Secondary | ICD-10-CM | POA: Diagnosis not present

## 2020-07-18 DIAGNOSIS — I429 Cardiomyopathy, unspecified: Secondary | ICD-10-CM | POA: Diagnosis not present

## 2020-07-19 DIAGNOSIS — Z79890 Hormone replacement therapy (postmenopausal): Secondary | ICD-10-CM | POA: Diagnosis not present

## 2020-07-19 DIAGNOSIS — Z7901 Long term (current) use of anticoagulants: Secondary | ICD-10-CM | POA: Diagnosis not present

## 2020-07-19 DIAGNOSIS — E7800 Pure hypercholesterolemia, unspecified: Secondary | ICD-10-CM | POA: Diagnosis not present

## 2020-07-19 DIAGNOSIS — I493 Ventricular premature depolarization: Secondary | ICD-10-CM | POA: Diagnosis not present

## 2020-07-19 DIAGNOSIS — I130 Hypertensive heart and chronic kidney disease with heart failure and stage 1 through stage 4 chronic kidney disease, or unspecified chronic kidney disease: Secondary | ICD-10-CM | POA: Diagnosis not present

## 2020-07-19 DIAGNOSIS — I5042 Chronic combined systolic (congestive) and diastolic (congestive) heart failure: Secondary | ICD-10-CM | POA: Diagnosis not present

## 2020-07-19 DIAGNOSIS — I480 Paroxysmal atrial fibrillation: Secondary | ICD-10-CM | POA: Diagnosis not present

## 2020-07-19 DIAGNOSIS — Z950 Presence of cardiac pacemaker: Secondary | ICD-10-CM | POA: Diagnosis not present

## 2020-07-19 DIAGNOSIS — E785 Hyperlipidemia, unspecified: Secondary | ICD-10-CM | POA: Diagnosis not present

## 2020-07-19 DIAGNOSIS — I454 Nonspecific intraventricular block: Secondary | ICD-10-CM | POA: Diagnosis not present

## 2020-07-19 DIAGNOSIS — L89899 Pressure ulcer of other site, unspecified stage: Secondary | ICD-10-CM | POA: Diagnosis not present

## 2020-07-19 DIAGNOSIS — E039 Hypothyroidism, unspecified: Secondary | ICD-10-CM | POA: Diagnosis not present

## 2020-07-19 DIAGNOSIS — I951 Orthostatic hypotension: Secondary | ICD-10-CM | POA: Diagnosis not present

## 2020-07-19 DIAGNOSIS — I252 Old myocardial infarction: Secondary | ICD-10-CM | POA: Diagnosis not present

## 2020-07-19 DIAGNOSIS — J9811 Atelectasis: Secondary | ICD-10-CM | POA: Diagnosis not present

## 2020-07-19 DIAGNOSIS — I428 Other cardiomyopathies: Secondary | ICD-10-CM | POA: Diagnosis not present

## 2020-07-19 DIAGNOSIS — Z4502 Encounter for adjustment and management of automatic implantable cardiac defibrillator: Secondary | ICD-10-CM | POA: Diagnosis not present

## 2020-07-19 DIAGNOSIS — I2510 Atherosclerotic heart disease of native coronary artery without angina pectoris: Secondary | ICD-10-CM | POA: Diagnosis not present

## 2020-07-19 DIAGNOSIS — N183 Chronic kidney disease, stage 3 unspecified: Secondary | ICD-10-CM | POA: Diagnosis not present

## 2020-07-20 DIAGNOSIS — I428 Other cardiomyopathies: Secondary | ICD-10-CM | POA: Diagnosis not present

## 2020-07-20 DIAGNOSIS — I252 Old myocardial infarction: Secondary | ICD-10-CM | POA: Diagnosis not present

## 2020-07-20 DIAGNOSIS — E785 Hyperlipidemia, unspecified: Secondary | ICD-10-CM | POA: Diagnosis not present

## 2020-07-20 DIAGNOSIS — L89899 Pressure ulcer of other site, unspecified stage: Secondary | ICD-10-CM | POA: Diagnosis not present

## 2020-07-20 DIAGNOSIS — I454 Nonspecific intraventricular block: Secondary | ICD-10-CM | POA: Diagnosis not present

## 2020-07-20 DIAGNOSIS — I5042 Chronic combined systolic (congestive) and diastolic (congestive) heart failure: Secondary | ICD-10-CM | POA: Diagnosis not present

## 2020-07-20 DIAGNOSIS — I480 Paroxysmal atrial fibrillation: Secondary | ICD-10-CM | POA: Diagnosis not present

## 2020-07-20 DIAGNOSIS — N183 Chronic kidney disease, stage 3 unspecified: Secondary | ICD-10-CM | POA: Diagnosis not present

## 2020-07-20 DIAGNOSIS — I2510 Atherosclerotic heart disease of native coronary artery without angina pectoris: Secondary | ICD-10-CM | POA: Diagnosis not present

## 2020-07-20 DIAGNOSIS — I951 Orthostatic hypotension: Secondary | ICD-10-CM | POA: Diagnosis not present

## 2020-07-20 DIAGNOSIS — Z79890 Hormone replacement therapy (postmenopausal): Secondary | ICD-10-CM | POA: Diagnosis not present

## 2020-07-20 DIAGNOSIS — Z7901 Long term (current) use of anticoagulants: Secondary | ICD-10-CM | POA: Diagnosis not present

## 2020-07-20 DIAGNOSIS — I130 Hypertensive heart and chronic kidney disease with heart failure and stage 1 through stage 4 chronic kidney disease, or unspecified chronic kidney disease: Secondary | ICD-10-CM | POA: Diagnosis not present

## 2020-07-20 DIAGNOSIS — E7800 Pure hypercholesterolemia, unspecified: Secondary | ICD-10-CM | POA: Diagnosis not present

## 2020-07-20 DIAGNOSIS — E039 Hypothyroidism, unspecified: Secondary | ICD-10-CM | POA: Diagnosis not present

## 2020-07-20 DIAGNOSIS — Z950 Presence of cardiac pacemaker: Secondary | ICD-10-CM | POA: Diagnosis not present

## 2020-07-20 DIAGNOSIS — I493 Ventricular premature depolarization: Secondary | ICD-10-CM | POA: Diagnosis not present

## 2020-07-21 DIAGNOSIS — R262 Difficulty in walking, not elsewhere classified: Secondary | ICD-10-CM | POA: Diagnosis not present

## 2020-07-21 DIAGNOSIS — I10 Essential (primary) hypertension: Secondary | ICD-10-CM | POA: Diagnosis not present

## 2020-07-21 DIAGNOSIS — M6281 Muscle weakness (generalized): Secondary | ICD-10-CM | POA: Diagnosis not present

## 2020-07-21 DIAGNOSIS — I951 Orthostatic hypotension: Secondary | ICD-10-CM | POA: Diagnosis not present

## 2020-07-21 DIAGNOSIS — I5020 Unspecified systolic (congestive) heart failure: Secondary | ICD-10-CM | POA: Diagnosis not present

## 2020-07-21 DIAGNOSIS — I472 Ventricular tachycardia: Secondary | ICD-10-CM | POA: Diagnosis not present

## 2020-07-21 DIAGNOSIS — I429 Cardiomyopathy, unspecified: Secondary | ICD-10-CM | POA: Diagnosis not present

## 2020-07-21 DIAGNOSIS — Z48812 Encounter for surgical aftercare following surgery on the circulatory system: Secondary | ICD-10-CM | POA: Diagnosis not present

## 2020-07-27 DIAGNOSIS — I5022 Chronic systolic (congestive) heart failure: Secondary | ICD-10-CM | POA: Diagnosis not present

## 2020-07-27 DIAGNOSIS — N183 Chronic kidney disease, stage 3 unspecified: Secondary | ICD-10-CM | POA: Diagnosis not present

## 2020-07-27 DIAGNOSIS — I10 Essential (primary) hypertension: Secondary | ICD-10-CM | POA: Diagnosis not present

## 2020-07-27 DIAGNOSIS — Z515 Encounter for palliative care: Secondary | ICD-10-CM | POA: Diagnosis not present

## 2020-07-28 DIAGNOSIS — I10 Essential (primary) hypertension: Secondary | ICD-10-CM | POA: Diagnosis not present

## 2020-07-28 DIAGNOSIS — I472 Ventricular tachycardia: Secondary | ICD-10-CM | POA: Diagnosis not present

## 2020-07-28 DIAGNOSIS — I5020 Unspecified systolic (congestive) heart failure: Secondary | ICD-10-CM | POA: Diagnosis not present

## 2020-07-28 DIAGNOSIS — I480 Paroxysmal atrial fibrillation: Secondary | ICD-10-CM | POA: Diagnosis not present

## 2020-08-01 DIAGNOSIS — I429 Cardiomyopathy, unspecified: Secondary | ICD-10-CM | POA: Diagnosis not present

## 2020-08-22 DIAGNOSIS — R2689 Other abnormalities of gait and mobility: Secondary | ICD-10-CM | POA: Diagnosis not present

## 2020-08-22 DIAGNOSIS — I429 Cardiomyopathy, unspecified: Secondary | ICD-10-CM | POA: Diagnosis not present

## 2020-08-22 DIAGNOSIS — Z95810 Presence of automatic (implantable) cardiac defibrillator: Secondary | ICD-10-CM | POA: Diagnosis not present

## 2020-08-22 DIAGNOSIS — M6281 Muscle weakness (generalized): Secondary | ICD-10-CM | POA: Diagnosis not present

## 2020-08-22 DIAGNOSIS — R262 Difficulty in walking, not elsewhere classified: Secondary | ICD-10-CM | POA: Diagnosis not present

## 2020-08-23 DIAGNOSIS — I10 Essential (primary) hypertension: Secondary | ICD-10-CM | POA: Diagnosis not present

## 2020-08-23 DIAGNOSIS — R2689 Other abnormalities of gait and mobility: Secondary | ICD-10-CM | POA: Diagnosis not present

## 2020-08-23 DIAGNOSIS — M6281 Muscle weakness (generalized): Secondary | ICD-10-CM | POA: Diagnosis not present

## 2020-08-23 DIAGNOSIS — I480 Paroxysmal atrial fibrillation: Secondary | ICD-10-CM | POA: Diagnosis not present

## 2020-08-23 DIAGNOSIS — I5020 Unspecified systolic (congestive) heart failure: Secondary | ICD-10-CM | POA: Diagnosis not present

## 2020-08-23 DIAGNOSIS — E039 Hypothyroidism, unspecified: Secondary | ICD-10-CM | POA: Diagnosis not present

## 2020-08-23 DIAGNOSIS — I429 Cardiomyopathy, unspecified: Secondary | ICD-10-CM | POA: Diagnosis not present

## 2020-08-23 DIAGNOSIS — Z95810 Presence of automatic (implantable) cardiac defibrillator: Secondary | ICD-10-CM | POA: Diagnosis not present

## 2020-08-23 DIAGNOSIS — R262 Difficulty in walking, not elsewhere classified: Secondary | ICD-10-CM | POA: Diagnosis not present

## 2020-08-25 DIAGNOSIS — M6281 Muscle weakness (generalized): Secondary | ICD-10-CM | POA: Diagnosis not present

## 2020-08-25 DIAGNOSIS — Z95810 Presence of automatic (implantable) cardiac defibrillator: Secondary | ICD-10-CM | POA: Diagnosis not present

## 2020-08-25 DIAGNOSIS — R2689 Other abnormalities of gait and mobility: Secondary | ICD-10-CM | POA: Diagnosis not present

## 2020-08-25 DIAGNOSIS — R262 Difficulty in walking, not elsewhere classified: Secondary | ICD-10-CM | POA: Diagnosis not present

## 2020-08-25 DIAGNOSIS — I429 Cardiomyopathy, unspecified: Secondary | ICD-10-CM | POA: Diagnosis not present

## 2020-08-26 DIAGNOSIS — I429 Cardiomyopathy, unspecified: Secondary | ICD-10-CM | POA: Diagnosis not present

## 2020-08-26 DIAGNOSIS — M6281 Muscle weakness (generalized): Secondary | ICD-10-CM | POA: Diagnosis not present

## 2020-08-26 DIAGNOSIS — Z95810 Presence of automatic (implantable) cardiac defibrillator: Secondary | ICD-10-CM | POA: Diagnosis not present

## 2020-08-26 DIAGNOSIS — R2689 Other abnormalities of gait and mobility: Secondary | ICD-10-CM | POA: Diagnosis not present

## 2020-08-26 DIAGNOSIS — R262 Difficulty in walking, not elsewhere classified: Secondary | ICD-10-CM | POA: Diagnosis not present

## 2020-08-29 DIAGNOSIS — R2689 Other abnormalities of gait and mobility: Secondary | ICD-10-CM | POA: Diagnosis not present

## 2020-08-29 DIAGNOSIS — I5023 Acute on chronic systolic (congestive) heart failure: Secondary | ICD-10-CM | POA: Diagnosis not present

## 2020-08-29 DIAGNOSIS — R262 Difficulty in walking, not elsewhere classified: Secondary | ICD-10-CM | POA: Diagnosis not present

## 2020-08-29 DIAGNOSIS — E538 Deficiency of other specified B group vitamins: Secondary | ICD-10-CM | POA: Diagnosis not present

## 2020-08-29 DIAGNOSIS — M6281 Muscle weakness (generalized): Secondary | ICD-10-CM | POA: Diagnosis not present

## 2020-08-29 DIAGNOSIS — I429 Cardiomyopathy, unspecified: Secondary | ICD-10-CM | POA: Diagnosis not present

## 2020-08-29 DIAGNOSIS — Z95810 Presence of automatic (implantable) cardiac defibrillator: Secondary | ICD-10-CM | POA: Diagnosis not present

## 2020-08-29 DIAGNOSIS — D649 Anemia, unspecified: Secondary | ICD-10-CM | POA: Diagnosis not present

## 2020-08-30 DIAGNOSIS — M6281 Muscle weakness (generalized): Secondary | ICD-10-CM | POA: Diagnosis not present

## 2020-08-30 DIAGNOSIS — I5020 Unspecified systolic (congestive) heart failure: Secondary | ICD-10-CM | POA: Diagnosis not present

## 2020-08-30 DIAGNOSIS — Z95810 Presence of automatic (implantable) cardiac defibrillator: Secondary | ICD-10-CM | POA: Diagnosis not present

## 2020-08-30 DIAGNOSIS — I429 Cardiomyopathy, unspecified: Secondary | ICD-10-CM | POA: Diagnosis not present

## 2020-08-30 DIAGNOSIS — I10 Essential (primary) hypertension: Secondary | ICD-10-CM | POA: Diagnosis not present

## 2020-08-30 DIAGNOSIS — R2689 Other abnormalities of gait and mobility: Secondary | ICD-10-CM | POA: Diagnosis not present

## 2020-08-30 DIAGNOSIS — I951 Orthostatic hypotension: Secondary | ICD-10-CM | POA: Diagnosis not present

## 2020-08-30 DIAGNOSIS — I480 Paroxysmal atrial fibrillation: Secondary | ICD-10-CM | POA: Diagnosis not present

## 2020-08-30 DIAGNOSIS — R262 Difficulty in walking, not elsewhere classified: Secondary | ICD-10-CM | POA: Diagnosis not present

## 2020-08-31 DIAGNOSIS — R262 Difficulty in walking, not elsewhere classified: Secondary | ICD-10-CM | POA: Diagnosis not present

## 2020-08-31 DIAGNOSIS — I429 Cardiomyopathy, unspecified: Secondary | ICD-10-CM | POA: Diagnosis not present

## 2020-08-31 DIAGNOSIS — Z95810 Presence of automatic (implantable) cardiac defibrillator: Secondary | ICD-10-CM | POA: Diagnosis not present

## 2020-08-31 DIAGNOSIS — M6281 Muscle weakness (generalized): Secondary | ICD-10-CM | POA: Diagnosis not present

## 2020-08-31 DIAGNOSIS — R2689 Other abnormalities of gait and mobility: Secondary | ICD-10-CM | POA: Diagnosis not present

## 2020-09-01 DIAGNOSIS — R262 Difficulty in walking, not elsewhere classified: Secondary | ICD-10-CM | POA: Diagnosis not present

## 2020-09-01 DIAGNOSIS — Z95810 Presence of automatic (implantable) cardiac defibrillator: Secondary | ICD-10-CM | POA: Diagnosis not present

## 2020-09-01 DIAGNOSIS — M6281 Muscle weakness (generalized): Secondary | ICD-10-CM | POA: Diagnosis not present

## 2020-09-01 DIAGNOSIS — R2689 Other abnormalities of gait and mobility: Secondary | ICD-10-CM | POA: Diagnosis not present

## 2020-09-01 DIAGNOSIS — I429 Cardiomyopathy, unspecified: Secondary | ICD-10-CM | POA: Diagnosis not present

## 2020-09-02 DIAGNOSIS — R2689 Other abnormalities of gait and mobility: Secondary | ICD-10-CM | POA: Diagnosis not present

## 2020-09-02 DIAGNOSIS — M6281 Muscle weakness (generalized): Secondary | ICD-10-CM | POA: Diagnosis not present

## 2020-09-02 DIAGNOSIS — I429 Cardiomyopathy, unspecified: Secondary | ICD-10-CM | POA: Diagnosis not present

## 2020-09-02 DIAGNOSIS — Z95810 Presence of automatic (implantable) cardiac defibrillator: Secondary | ICD-10-CM | POA: Diagnosis not present

## 2020-09-02 DIAGNOSIS — R262 Difficulty in walking, not elsewhere classified: Secondary | ICD-10-CM | POA: Diagnosis not present

## 2020-09-05 DIAGNOSIS — R262 Difficulty in walking, not elsewhere classified: Secondary | ICD-10-CM | POA: Diagnosis not present

## 2020-09-05 DIAGNOSIS — R2689 Other abnormalities of gait and mobility: Secondary | ICD-10-CM | POA: Diagnosis not present

## 2020-09-05 DIAGNOSIS — M6281 Muscle weakness (generalized): Secondary | ICD-10-CM | POA: Diagnosis not present

## 2020-09-05 DIAGNOSIS — Z95810 Presence of automatic (implantable) cardiac defibrillator: Secondary | ICD-10-CM | POA: Diagnosis not present

## 2020-09-05 DIAGNOSIS — I429 Cardiomyopathy, unspecified: Secondary | ICD-10-CM | POA: Diagnosis not present

## 2020-09-06 DIAGNOSIS — R262 Difficulty in walking, not elsewhere classified: Secondary | ICD-10-CM | POA: Diagnosis not present

## 2020-09-06 DIAGNOSIS — R2689 Other abnormalities of gait and mobility: Secondary | ICD-10-CM | POA: Diagnosis not present

## 2020-09-06 DIAGNOSIS — M6281 Muscle weakness (generalized): Secondary | ICD-10-CM | POA: Diagnosis not present

## 2020-09-06 DIAGNOSIS — Z95810 Presence of automatic (implantable) cardiac defibrillator: Secondary | ICD-10-CM | POA: Diagnosis not present

## 2020-09-06 DIAGNOSIS — I429 Cardiomyopathy, unspecified: Secondary | ICD-10-CM | POA: Diagnosis not present

## 2020-09-07 DIAGNOSIS — N183 Chronic kidney disease, stage 3 unspecified: Secondary | ICD-10-CM | POA: Diagnosis not present

## 2020-09-07 DIAGNOSIS — Z515 Encounter for palliative care: Secondary | ICD-10-CM | POA: Diagnosis not present

## 2020-09-07 DIAGNOSIS — I5022 Chronic systolic (congestive) heart failure: Secondary | ICD-10-CM | POA: Diagnosis not present

## 2020-09-07 DIAGNOSIS — I10 Essential (primary) hypertension: Secondary | ICD-10-CM | POA: Diagnosis not present

## 2020-09-08 DIAGNOSIS — R262 Difficulty in walking, not elsewhere classified: Secondary | ICD-10-CM | POA: Diagnosis not present

## 2020-09-08 DIAGNOSIS — R2689 Other abnormalities of gait and mobility: Secondary | ICD-10-CM | POA: Diagnosis not present

## 2020-09-08 DIAGNOSIS — I429 Cardiomyopathy, unspecified: Secondary | ICD-10-CM | POA: Diagnosis not present

## 2020-09-08 DIAGNOSIS — Z95810 Presence of automatic (implantable) cardiac defibrillator: Secondary | ICD-10-CM | POA: Diagnosis not present

## 2020-09-08 DIAGNOSIS — M6281 Muscle weakness (generalized): Secondary | ICD-10-CM | POA: Diagnosis not present

## 2020-09-09 DIAGNOSIS — M6281 Muscle weakness (generalized): Secondary | ICD-10-CM | POA: Diagnosis not present

## 2020-09-09 DIAGNOSIS — R2689 Other abnormalities of gait and mobility: Secondary | ICD-10-CM | POA: Diagnosis not present

## 2020-09-09 DIAGNOSIS — R262 Difficulty in walking, not elsewhere classified: Secondary | ICD-10-CM | POA: Diagnosis not present

## 2020-09-09 DIAGNOSIS — I429 Cardiomyopathy, unspecified: Secondary | ICD-10-CM | POA: Diagnosis not present

## 2020-09-09 DIAGNOSIS — Z95810 Presence of automatic (implantable) cardiac defibrillator: Secondary | ICD-10-CM | POA: Diagnosis not present

## 2020-09-12 DIAGNOSIS — I429 Cardiomyopathy, unspecified: Secondary | ICD-10-CM | POA: Diagnosis not present

## 2020-09-12 DIAGNOSIS — R2689 Other abnormalities of gait and mobility: Secondary | ICD-10-CM | POA: Diagnosis not present

## 2020-09-12 DIAGNOSIS — M6281 Muscle weakness (generalized): Secondary | ICD-10-CM | POA: Diagnosis not present

## 2020-09-12 DIAGNOSIS — R262 Difficulty in walking, not elsewhere classified: Secondary | ICD-10-CM | POA: Diagnosis not present

## 2020-09-12 DIAGNOSIS — Z95810 Presence of automatic (implantable) cardiac defibrillator: Secondary | ICD-10-CM | POA: Diagnosis not present

## 2020-09-13 DIAGNOSIS — R2689 Other abnormalities of gait and mobility: Secondary | ICD-10-CM | POA: Diagnosis not present

## 2020-09-13 DIAGNOSIS — Z95810 Presence of automatic (implantable) cardiac defibrillator: Secondary | ICD-10-CM | POA: Diagnosis not present

## 2020-09-13 DIAGNOSIS — M6281 Muscle weakness (generalized): Secondary | ICD-10-CM | POA: Diagnosis not present

## 2020-09-13 DIAGNOSIS — R262 Difficulty in walking, not elsewhere classified: Secondary | ICD-10-CM | POA: Diagnosis not present

## 2020-09-13 DIAGNOSIS — I429 Cardiomyopathy, unspecified: Secondary | ICD-10-CM | POA: Diagnosis not present

## 2020-09-14 DIAGNOSIS — I429 Cardiomyopathy, unspecified: Secondary | ICD-10-CM | POA: Diagnosis not present

## 2020-09-14 DIAGNOSIS — R262 Difficulty in walking, not elsewhere classified: Secondary | ICD-10-CM | POA: Diagnosis not present

## 2020-09-14 DIAGNOSIS — M6281 Muscle weakness (generalized): Secondary | ICD-10-CM | POA: Diagnosis not present

## 2020-09-14 DIAGNOSIS — R2689 Other abnormalities of gait and mobility: Secondary | ICD-10-CM | POA: Diagnosis not present

## 2020-09-14 DIAGNOSIS — Z95810 Presence of automatic (implantable) cardiac defibrillator: Secondary | ICD-10-CM | POA: Diagnosis not present

## 2020-09-15 DIAGNOSIS — Z95810 Presence of automatic (implantable) cardiac defibrillator: Secondary | ICD-10-CM | POA: Diagnosis not present

## 2020-09-15 DIAGNOSIS — R2689 Other abnormalities of gait and mobility: Secondary | ICD-10-CM | POA: Diagnosis not present

## 2020-09-15 DIAGNOSIS — R262 Difficulty in walking, not elsewhere classified: Secondary | ICD-10-CM | POA: Diagnosis not present

## 2020-09-15 DIAGNOSIS — M6281 Muscle weakness (generalized): Secondary | ICD-10-CM | POA: Diagnosis not present

## 2020-09-15 DIAGNOSIS — I429 Cardiomyopathy, unspecified: Secondary | ICD-10-CM | POA: Diagnosis not present

## 2020-09-16 DIAGNOSIS — Z95810 Presence of automatic (implantable) cardiac defibrillator: Secondary | ICD-10-CM | POA: Diagnosis not present

## 2020-09-16 DIAGNOSIS — M6281 Muscle weakness (generalized): Secondary | ICD-10-CM | POA: Diagnosis not present

## 2020-09-16 DIAGNOSIS — R262 Difficulty in walking, not elsewhere classified: Secondary | ICD-10-CM | POA: Diagnosis not present

## 2020-09-16 DIAGNOSIS — R2689 Other abnormalities of gait and mobility: Secondary | ICD-10-CM | POA: Diagnosis not present

## 2020-09-16 DIAGNOSIS — I429 Cardiomyopathy, unspecified: Secondary | ICD-10-CM | POA: Diagnosis not present

## 2020-09-19 DIAGNOSIS — R2689 Other abnormalities of gait and mobility: Secondary | ICD-10-CM | POA: Diagnosis not present

## 2020-09-19 DIAGNOSIS — Z95810 Presence of automatic (implantable) cardiac defibrillator: Secondary | ICD-10-CM | POA: Diagnosis not present

## 2020-09-19 DIAGNOSIS — M6281 Muscle weakness (generalized): Secondary | ICD-10-CM | POA: Diagnosis not present

## 2020-09-19 DIAGNOSIS — R262 Difficulty in walking, not elsewhere classified: Secondary | ICD-10-CM | POA: Diagnosis not present

## 2020-09-19 DIAGNOSIS — I429 Cardiomyopathy, unspecified: Secondary | ICD-10-CM | POA: Diagnosis not present

## 2020-09-20 DIAGNOSIS — I429 Cardiomyopathy, unspecified: Secondary | ICD-10-CM | POA: Diagnosis not present

## 2020-09-20 DIAGNOSIS — M6281 Muscle weakness (generalized): Secondary | ICD-10-CM | POA: Diagnosis not present

## 2020-09-20 DIAGNOSIS — R262 Difficulty in walking, not elsewhere classified: Secondary | ICD-10-CM | POA: Diagnosis not present

## 2020-09-20 DIAGNOSIS — R2689 Other abnormalities of gait and mobility: Secondary | ICD-10-CM | POA: Diagnosis not present

## 2020-09-20 DIAGNOSIS — Z95810 Presence of automatic (implantable) cardiac defibrillator: Secondary | ICD-10-CM | POA: Diagnosis not present

## 2020-09-21 DIAGNOSIS — Z95810 Presence of automatic (implantable) cardiac defibrillator: Secondary | ICD-10-CM | POA: Diagnosis not present

## 2020-09-21 DIAGNOSIS — I429 Cardiomyopathy, unspecified: Secondary | ICD-10-CM | POA: Diagnosis not present

## 2020-09-21 DIAGNOSIS — R262 Difficulty in walking, not elsewhere classified: Secondary | ICD-10-CM | POA: Diagnosis not present

## 2020-09-21 DIAGNOSIS — R2689 Other abnormalities of gait and mobility: Secondary | ICD-10-CM | POA: Diagnosis not present

## 2020-09-21 DIAGNOSIS — M6281 Muscle weakness (generalized): Secondary | ICD-10-CM | POA: Diagnosis not present

## 2020-09-22 DIAGNOSIS — I429 Cardiomyopathy, unspecified: Secondary | ICD-10-CM | POA: Diagnosis not present

## 2020-09-22 DIAGNOSIS — R262 Difficulty in walking, not elsewhere classified: Secondary | ICD-10-CM | POA: Diagnosis not present

## 2020-09-23 DIAGNOSIS — F329 Major depressive disorder, single episode, unspecified: Secondary | ICD-10-CM | POA: Diagnosis not present

## 2020-09-23 DIAGNOSIS — I429 Cardiomyopathy, unspecified: Secondary | ICD-10-CM | POA: Diagnosis not present

## 2020-09-23 DIAGNOSIS — R262 Difficulty in walking, not elsewhere classified: Secondary | ICD-10-CM | POA: Diagnosis not present

## 2020-09-26 DIAGNOSIS — R262 Difficulty in walking, not elsewhere classified: Secondary | ICD-10-CM | POA: Diagnosis not present

## 2020-09-26 DIAGNOSIS — I429 Cardiomyopathy, unspecified: Secondary | ICD-10-CM | POA: Diagnosis not present

## 2020-09-27 DIAGNOSIS — F332 Major depressive disorder, recurrent severe without psychotic features: Secondary | ICD-10-CM | POA: Diagnosis not present

## 2020-09-27 DIAGNOSIS — R262 Difficulty in walking, not elsewhere classified: Secondary | ICD-10-CM | POA: Diagnosis not present

## 2020-09-27 DIAGNOSIS — I429 Cardiomyopathy, unspecified: Secondary | ICD-10-CM | POA: Diagnosis not present

## 2020-09-28 DIAGNOSIS — R262 Difficulty in walking, not elsewhere classified: Secondary | ICD-10-CM | POA: Diagnosis not present

## 2020-09-28 DIAGNOSIS — I429 Cardiomyopathy, unspecified: Secondary | ICD-10-CM | POA: Diagnosis not present

## 2020-09-29 DIAGNOSIS — R262 Difficulty in walking, not elsewhere classified: Secondary | ICD-10-CM | POA: Diagnosis not present

## 2020-09-29 DIAGNOSIS — I429 Cardiomyopathy, unspecified: Secondary | ICD-10-CM | POA: Diagnosis not present

## 2020-09-30 DIAGNOSIS — R262 Difficulty in walking, not elsewhere classified: Secondary | ICD-10-CM | POA: Diagnosis not present

## 2020-09-30 DIAGNOSIS — I429 Cardiomyopathy, unspecified: Secondary | ICD-10-CM | POA: Diagnosis not present

## 2020-10-03 DIAGNOSIS — R262 Difficulty in walking, not elsewhere classified: Secondary | ICD-10-CM | POA: Diagnosis not present

## 2020-10-03 DIAGNOSIS — I429 Cardiomyopathy, unspecified: Secondary | ICD-10-CM | POA: Diagnosis not present

## 2020-10-04 DIAGNOSIS — I429 Cardiomyopathy, unspecified: Secondary | ICD-10-CM | POA: Diagnosis not present

## 2020-10-04 DIAGNOSIS — R262 Difficulty in walking, not elsewhere classified: Secondary | ICD-10-CM | POA: Diagnosis not present

## 2020-10-05 DIAGNOSIS — R262 Difficulty in walking, not elsewhere classified: Secondary | ICD-10-CM | POA: Diagnosis not present

## 2020-10-05 DIAGNOSIS — I429 Cardiomyopathy, unspecified: Secondary | ICD-10-CM | POA: Diagnosis not present

## 2020-10-06 DIAGNOSIS — I10 Essential (primary) hypertension: Secondary | ICD-10-CM | POA: Diagnosis not present

## 2020-10-06 DIAGNOSIS — R0609 Other forms of dyspnea: Secondary | ICD-10-CM | POA: Diagnosis not present

## 2020-10-06 DIAGNOSIS — I429 Cardiomyopathy, unspecified: Secondary | ICD-10-CM | POA: Diagnosis not present

## 2020-10-06 DIAGNOSIS — E7800 Pure hypercholesterolemia, unspecified: Secondary | ICD-10-CM | POA: Diagnosis not present

## 2020-10-06 DIAGNOSIS — I5020 Unspecified systolic (congestive) heart failure: Secondary | ICD-10-CM | POA: Diagnosis not present

## 2020-10-06 DIAGNOSIS — R059 Cough, unspecified: Secondary | ICD-10-CM | POA: Diagnosis not present

## 2020-10-06 DIAGNOSIS — R262 Difficulty in walking, not elsewhere classified: Secondary | ICD-10-CM | POA: Diagnosis not present

## 2020-10-06 DIAGNOSIS — R0902 Hypoxemia: Secondary | ICD-10-CM | POA: Diagnosis not present

## 2020-10-07 DIAGNOSIS — R262 Difficulty in walking, not elsewhere classified: Secondary | ICD-10-CM | POA: Diagnosis not present

## 2020-10-07 DIAGNOSIS — I429 Cardiomyopathy, unspecified: Secondary | ICD-10-CM | POA: Diagnosis not present

## 2020-10-10 DIAGNOSIS — R262 Difficulty in walking, not elsewhere classified: Secondary | ICD-10-CM | POA: Diagnosis not present

## 2020-10-10 DIAGNOSIS — I429 Cardiomyopathy, unspecified: Secondary | ICD-10-CM | POA: Diagnosis not present

## 2020-10-11 DIAGNOSIS — R262 Difficulty in walking, not elsewhere classified: Secondary | ICD-10-CM | POA: Diagnosis not present

## 2020-10-11 DIAGNOSIS — I429 Cardiomyopathy, unspecified: Secondary | ICD-10-CM | POA: Diagnosis not present

## 2020-10-12 DIAGNOSIS — I429 Cardiomyopathy, unspecified: Secondary | ICD-10-CM | POA: Diagnosis not present

## 2020-10-12 DIAGNOSIS — D509 Iron deficiency anemia, unspecified: Secondary | ICD-10-CM | POA: Diagnosis not present

## 2020-10-12 DIAGNOSIS — R262 Difficulty in walking, not elsewhere classified: Secondary | ICD-10-CM | POA: Diagnosis not present

## 2020-10-13 DIAGNOSIS — R262 Difficulty in walking, not elsewhere classified: Secondary | ICD-10-CM | POA: Diagnosis not present

## 2020-10-13 DIAGNOSIS — I429 Cardiomyopathy, unspecified: Secondary | ICD-10-CM | POA: Diagnosis not present

## 2020-10-14 DIAGNOSIS — R262 Difficulty in walking, not elsewhere classified: Secondary | ICD-10-CM | POA: Diagnosis not present

## 2020-10-14 DIAGNOSIS — I429 Cardiomyopathy, unspecified: Secondary | ICD-10-CM | POA: Diagnosis not present

## 2020-10-17 DIAGNOSIS — I429 Cardiomyopathy, unspecified: Secondary | ICD-10-CM | POA: Diagnosis not present

## 2020-10-17 DIAGNOSIS — R262 Difficulty in walking, not elsewhere classified: Secondary | ICD-10-CM | POA: Diagnosis not present

## 2020-10-18 DIAGNOSIS — I480 Paroxysmal atrial fibrillation: Secondary | ICD-10-CM | POA: Diagnosis not present

## 2020-10-18 DIAGNOSIS — I10 Essential (primary) hypertension: Secondary | ICD-10-CM | POA: Diagnosis not present

## 2020-10-18 DIAGNOSIS — R262 Difficulty in walking, not elsewhere classified: Secondary | ICD-10-CM | POA: Diagnosis not present

## 2020-10-18 DIAGNOSIS — I5020 Unspecified systolic (congestive) heart failure: Secondary | ICD-10-CM | POA: Diagnosis not present

## 2020-10-18 DIAGNOSIS — I429 Cardiomyopathy, unspecified: Secondary | ICD-10-CM | POA: Diagnosis not present

## 2020-10-18 DIAGNOSIS — E7800 Pure hypercholesterolemia, unspecified: Secondary | ICD-10-CM | POA: Diagnosis not present

## 2020-10-19 DIAGNOSIS — R262 Difficulty in walking, not elsewhere classified: Secondary | ICD-10-CM | POA: Diagnosis not present

## 2020-10-19 DIAGNOSIS — I429 Cardiomyopathy, unspecified: Secondary | ICD-10-CM | POA: Diagnosis not present

## 2020-10-20 DIAGNOSIS — I429 Cardiomyopathy, unspecified: Secondary | ICD-10-CM | POA: Diagnosis not present

## 2020-10-20 DIAGNOSIS — R262 Difficulty in walking, not elsewhere classified: Secondary | ICD-10-CM | POA: Diagnosis not present

## 2020-10-21 DIAGNOSIS — I429 Cardiomyopathy, unspecified: Secondary | ICD-10-CM | POA: Diagnosis not present

## 2020-10-21 DIAGNOSIS — R262 Difficulty in walking, not elsewhere classified: Secondary | ICD-10-CM | POA: Diagnosis not present

## 2020-10-25 DIAGNOSIS — R262 Difficulty in walking, not elsewhere classified: Secondary | ICD-10-CM | POA: Diagnosis not present

## 2020-10-25 DIAGNOSIS — I429 Cardiomyopathy, unspecified: Secondary | ICD-10-CM | POA: Diagnosis not present

## 2020-10-26 DIAGNOSIS — I4892 Unspecified atrial flutter: Secondary | ICD-10-CM | POA: Diagnosis not present

## 2020-10-26 DIAGNOSIS — Z95810 Presence of automatic (implantable) cardiac defibrillator: Secondary | ICD-10-CM | POA: Diagnosis not present

## 2020-10-26 DIAGNOSIS — I429 Cardiomyopathy, unspecified: Secondary | ICD-10-CM | POA: Diagnosis not present

## 2020-10-26 DIAGNOSIS — R262 Difficulty in walking, not elsewhere classified: Secondary | ICD-10-CM | POA: Diagnosis not present

## 2020-10-26 DIAGNOSIS — R9431 Abnormal electrocardiogram [ECG] [EKG]: Secondary | ICD-10-CM | POA: Diagnosis not present

## 2020-10-26 DIAGNOSIS — I428 Other cardiomyopathies: Secondary | ICD-10-CM | POA: Diagnosis not present

## 2020-10-27 DIAGNOSIS — R262 Difficulty in walking, not elsewhere classified: Secondary | ICD-10-CM | POA: Diagnosis not present

## 2020-10-27 DIAGNOSIS — I429 Cardiomyopathy, unspecified: Secondary | ICD-10-CM | POA: Diagnosis not present

## 2020-10-28 DIAGNOSIS — I429 Cardiomyopathy, unspecified: Secondary | ICD-10-CM | POA: Diagnosis not present

## 2020-10-28 DIAGNOSIS — R262 Difficulty in walking, not elsewhere classified: Secondary | ICD-10-CM | POA: Diagnosis not present

## 2020-10-31 DIAGNOSIS — I429 Cardiomyopathy, unspecified: Secondary | ICD-10-CM | POA: Diagnosis not present

## 2020-10-31 DIAGNOSIS — R262 Difficulty in walking, not elsewhere classified: Secondary | ICD-10-CM | POA: Diagnosis not present

## 2020-11-01 DIAGNOSIS — I429 Cardiomyopathy, unspecified: Secondary | ICD-10-CM | POA: Diagnosis not present

## 2020-11-01 DIAGNOSIS — R262 Difficulty in walking, not elsewhere classified: Secondary | ICD-10-CM | POA: Diagnosis not present

## 2020-11-02 DIAGNOSIS — I10 Essential (primary) hypertension: Secondary | ICD-10-CM | POA: Diagnosis not present

## 2020-11-02 DIAGNOSIS — N183 Chronic kidney disease, stage 3 unspecified: Secondary | ICD-10-CM | POA: Diagnosis not present

## 2020-11-02 DIAGNOSIS — R262 Difficulty in walking, not elsewhere classified: Secondary | ICD-10-CM | POA: Diagnosis not present

## 2020-11-02 DIAGNOSIS — I5022 Chronic systolic (congestive) heart failure: Secondary | ICD-10-CM | POA: Diagnosis not present

## 2020-11-02 DIAGNOSIS — I429 Cardiomyopathy, unspecified: Secondary | ICD-10-CM | POA: Diagnosis not present

## 2020-11-02 DIAGNOSIS — Z515 Encounter for palliative care: Secondary | ICD-10-CM | POA: Diagnosis not present

## 2020-11-03 DIAGNOSIS — R262 Difficulty in walking, not elsewhere classified: Secondary | ICD-10-CM | POA: Diagnosis not present

## 2020-11-03 DIAGNOSIS — I429 Cardiomyopathy, unspecified: Secondary | ICD-10-CM | POA: Diagnosis not present

## 2020-11-04 DIAGNOSIS — I429 Cardiomyopathy, unspecified: Secondary | ICD-10-CM | POA: Diagnosis not present

## 2020-11-04 DIAGNOSIS — R262 Difficulty in walking, not elsewhere classified: Secondary | ICD-10-CM | POA: Diagnosis not present

## 2020-11-08 DIAGNOSIS — R262 Difficulty in walking, not elsewhere classified: Secondary | ICD-10-CM | POA: Diagnosis not present

## 2020-11-08 DIAGNOSIS — Z95810 Presence of automatic (implantable) cardiac defibrillator: Secondary | ICD-10-CM | POA: Diagnosis not present

## 2020-11-08 DIAGNOSIS — I429 Cardiomyopathy, unspecified: Secondary | ICD-10-CM | POA: Diagnosis not present

## 2020-11-08 DIAGNOSIS — I447 Left bundle-branch block, unspecified: Secondary | ICD-10-CM | POA: Diagnosis not present

## 2020-11-08 DIAGNOSIS — I5022 Chronic systolic (congestive) heart failure: Secondary | ICD-10-CM | POA: Diagnosis not present

## 2020-11-09 DIAGNOSIS — I429 Cardiomyopathy, unspecified: Secondary | ICD-10-CM | POA: Diagnosis not present

## 2020-11-09 DIAGNOSIS — R262 Difficulty in walking, not elsewhere classified: Secondary | ICD-10-CM | POA: Diagnosis not present

## 2020-11-10 DIAGNOSIS — I429 Cardiomyopathy, unspecified: Secondary | ICD-10-CM | POA: Diagnosis not present

## 2020-11-10 DIAGNOSIS — I951 Orthostatic hypotension: Secondary | ICD-10-CM | POA: Diagnosis not present

## 2020-11-10 DIAGNOSIS — I10 Essential (primary) hypertension: Secondary | ICD-10-CM | POA: Diagnosis not present

## 2020-11-10 DIAGNOSIS — R262 Difficulty in walking, not elsewhere classified: Secondary | ICD-10-CM | POA: Diagnosis not present

## 2020-11-10 DIAGNOSIS — I5020 Unspecified systolic (congestive) heart failure: Secondary | ICD-10-CM | POA: Diagnosis not present

## 2020-11-10 DIAGNOSIS — I480 Paroxysmal atrial fibrillation: Secondary | ICD-10-CM | POA: Diagnosis not present

## 2020-11-15 DIAGNOSIS — F332 Major depressive disorder, recurrent severe without psychotic features: Secondary | ICD-10-CM | POA: Diagnosis not present

## 2020-11-15 DIAGNOSIS — R4689 Other symptoms and signs involving appearance and behavior: Secondary | ICD-10-CM | POA: Diagnosis not present

## 2020-11-15 DIAGNOSIS — I5020 Unspecified systolic (congestive) heart failure: Secondary | ICD-10-CM | POA: Diagnosis not present

## 2020-11-15 DIAGNOSIS — I10 Essential (primary) hypertension: Secondary | ICD-10-CM | POA: Diagnosis not present

## 2020-11-15 DIAGNOSIS — E7800 Pure hypercholesterolemia, unspecified: Secondary | ICD-10-CM | POA: Diagnosis not present

## 2020-11-15 DIAGNOSIS — I429 Cardiomyopathy, unspecified: Secondary | ICD-10-CM | POA: Diagnosis not present

## 2020-11-15 DIAGNOSIS — R5381 Other malaise: Secondary | ICD-10-CM | POA: Diagnosis not present

## 2020-11-15 DIAGNOSIS — R262 Difficulty in walking, not elsewhere classified: Secondary | ICD-10-CM | POA: Diagnosis not present

## 2020-11-15 DIAGNOSIS — I480 Paroxysmal atrial fibrillation: Secondary | ICD-10-CM | POA: Diagnosis not present

## 2020-11-16 DIAGNOSIS — R262 Difficulty in walking, not elsewhere classified: Secondary | ICD-10-CM | POA: Diagnosis not present

## 2020-11-16 DIAGNOSIS — E039 Hypothyroidism, unspecified: Secondary | ICD-10-CM | POA: Diagnosis not present

## 2020-11-16 DIAGNOSIS — I429 Cardiomyopathy, unspecified: Secondary | ICD-10-CM | POA: Diagnosis not present

## 2020-11-17 DIAGNOSIS — R262 Difficulty in walking, not elsewhere classified: Secondary | ICD-10-CM | POA: Diagnosis not present

## 2020-11-17 DIAGNOSIS — I429 Cardiomyopathy, unspecified: Secondary | ICD-10-CM | POA: Diagnosis not present

## 2020-11-21 DIAGNOSIS — R262 Difficulty in walking, not elsewhere classified: Secondary | ICD-10-CM | POA: Diagnosis not present

## 2020-11-21 DIAGNOSIS — I429 Cardiomyopathy, unspecified: Secondary | ICD-10-CM | POA: Diagnosis not present

## 2020-11-24 DIAGNOSIS — I480 Paroxysmal atrial fibrillation: Secondary | ICD-10-CM | POA: Diagnosis not present

## 2020-11-24 DIAGNOSIS — N183 Chronic kidney disease, stage 3 unspecified: Secondary | ICD-10-CM | POA: Diagnosis not present

## 2020-11-24 DIAGNOSIS — J9601 Acute respiratory failure with hypoxia: Secondary | ICD-10-CM | POA: Diagnosis not present

## 2020-11-24 DIAGNOSIS — I4892 Unspecified atrial flutter: Secondary | ICD-10-CM | POA: Diagnosis not present

## 2020-11-24 DIAGNOSIS — G4733 Obstructive sleep apnea (adult) (pediatric): Secondary | ICD-10-CM | POA: Diagnosis not present

## 2020-11-24 DIAGNOSIS — I493 Ventricular premature depolarization: Secondary | ICD-10-CM | POA: Diagnosis not present

## 2020-11-24 DIAGNOSIS — I428 Other cardiomyopathies: Secondary | ICD-10-CM | POA: Diagnosis not present

## 2020-11-24 DIAGNOSIS — I130 Hypertensive heart and chronic kidney disease with heart failure and stage 1 through stage 4 chronic kidney disease, or unspecified chronic kidney disease: Secondary | ICD-10-CM | POA: Diagnosis not present

## 2020-11-24 DIAGNOSIS — E039 Hypothyroidism, unspecified: Secondary | ICD-10-CM | POA: Diagnosis not present

## 2020-11-24 DIAGNOSIS — I951 Orthostatic hypotension: Secondary | ICD-10-CM | POA: Diagnosis not present

## 2020-11-24 DIAGNOSIS — I5042 Chronic combined systolic (congestive) and diastolic (congestive) heart failure: Secondary | ICD-10-CM | POA: Diagnosis not present

## 2020-11-24 DIAGNOSIS — I4729 Other ventricular tachycardia: Secondary | ICD-10-CM | POA: Diagnosis not present

## 2020-11-24 DIAGNOSIS — I2510 Atherosclerotic heart disease of native coronary artery without angina pectoris: Secondary | ICD-10-CM | POA: Diagnosis not present

## 2020-11-24 DIAGNOSIS — I447 Left bundle-branch block, unspecified: Secondary | ICD-10-CM | POA: Diagnosis not present

## 2020-11-24 DIAGNOSIS — D631 Anemia in chronic kidney disease: Secondary | ICD-10-CM | POA: Diagnosis not present

## 2020-12-14 DIAGNOSIS — I480 Paroxysmal atrial fibrillation: Secondary | ICD-10-CM | POA: Diagnosis not present

## 2020-12-14 DIAGNOSIS — I5020 Unspecified systolic (congestive) heart failure: Secondary | ICD-10-CM | POA: Diagnosis not present

## 2020-12-14 DIAGNOSIS — E039 Hypothyroidism, unspecified: Secondary | ICD-10-CM | POA: Diagnosis not present

## 2020-12-14 DIAGNOSIS — K269 Duodenal ulcer, unspecified as acute or chronic, without hemorrhage or perforation: Secondary | ICD-10-CM | POA: Diagnosis not present

## 2020-12-24 DIAGNOSIS — I480 Paroxysmal atrial fibrillation: Secondary | ICD-10-CM | POA: Diagnosis not present

## 2020-12-24 DIAGNOSIS — D631 Anemia in chronic kidney disease: Secondary | ICD-10-CM | POA: Diagnosis not present

## 2020-12-24 DIAGNOSIS — I130 Hypertensive heart and chronic kidney disease with heart failure and stage 1 through stage 4 chronic kidney disease, or unspecified chronic kidney disease: Secondary | ICD-10-CM | POA: Diagnosis not present

## 2020-12-24 DIAGNOSIS — G4733 Obstructive sleep apnea (adult) (pediatric): Secondary | ICD-10-CM | POA: Diagnosis not present

## 2020-12-24 DIAGNOSIS — J9601 Acute respiratory failure with hypoxia: Secondary | ICD-10-CM | POA: Diagnosis not present

## 2020-12-24 DIAGNOSIS — I447 Left bundle-branch block, unspecified: Secondary | ICD-10-CM | POA: Diagnosis not present

## 2020-12-24 DIAGNOSIS — I2510 Atherosclerotic heart disease of native coronary artery without angina pectoris: Secondary | ICD-10-CM | POA: Diagnosis not present

## 2020-12-24 DIAGNOSIS — E039 Hypothyroidism, unspecified: Secondary | ICD-10-CM | POA: Diagnosis not present

## 2020-12-24 DIAGNOSIS — I4729 Other ventricular tachycardia: Secondary | ICD-10-CM | POA: Diagnosis not present

## 2020-12-24 DIAGNOSIS — I4892 Unspecified atrial flutter: Secondary | ICD-10-CM | POA: Diagnosis not present

## 2020-12-24 DIAGNOSIS — I5042 Chronic combined systolic (congestive) and diastolic (congestive) heart failure: Secondary | ICD-10-CM | POA: Diagnosis not present

## 2020-12-24 DIAGNOSIS — I428 Other cardiomyopathies: Secondary | ICD-10-CM | POA: Diagnosis not present

## 2020-12-24 DIAGNOSIS — N183 Chronic kidney disease, stage 3 unspecified: Secondary | ICD-10-CM | POA: Diagnosis not present

## 2020-12-24 DIAGNOSIS — I493 Ventricular premature depolarization: Secondary | ICD-10-CM | POA: Diagnosis not present

## 2020-12-24 DIAGNOSIS — I951 Orthostatic hypotension: Secondary | ICD-10-CM | POA: Diagnosis not present

## 2021-01-04 DIAGNOSIS — I951 Orthostatic hypotension: Secondary | ICD-10-CM | POA: Diagnosis not present

## 2021-01-04 DIAGNOSIS — I10 Essential (primary) hypertension: Secondary | ICD-10-CM | POA: Diagnosis not present

## 2021-01-04 DIAGNOSIS — I480 Paroxysmal atrial fibrillation: Secondary | ICD-10-CM | POA: Diagnosis not present

## 2021-01-04 DIAGNOSIS — I4729 Other ventricular tachycardia: Secondary | ICD-10-CM | POA: Diagnosis not present

## 2021-01-23 DIAGNOSIS — G4733 Obstructive sleep apnea (adult) (pediatric): Secondary | ICD-10-CM | POA: Diagnosis not present

## 2021-01-23 DIAGNOSIS — I130 Hypertensive heart and chronic kidney disease with heart failure and stage 1 through stage 4 chronic kidney disease, or unspecified chronic kidney disease: Secondary | ICD-10-CM | POA: Diagnosis not present

## 2021-01-23 DIAGNOSIS — I4892 Unspecified atrial flutter: Secondary | ICD-10-CM | POA: Diagnosis not present

## 2021-01-23 DIAGNOSIS — I4729 Other ventricular tachycardia: Secondary | ICD-10-CM | POA: Diagnosis not present

## 2021-01-23 DIAGNOSIS — I493 Ventricular premature depolarization: Secondary | ICD-10-CM | POA: Diagnosis not present

## 2021-01-23 DIAGNOSIS — I428 Other cardiomyopathies: Secondary | ICD-10-CM | POA: Diagnosis not present

## 2021-01-23 DIAGNOSIS — I447 Left bundle-branch block, unspecified: Secondary | ICD-10-CM | POA: Diagnosis not present

## 2021-01-23 DIAGNOSIS — I480 Paroxysmal atrial fibrillation: Secondary | ICD-10-CM | POA: Diagnosis not present

## 2021-01-23 DIAGNOSIS — D631 Anemia in chronic kidney disease: Secondary | ICD-10-CM | POA: Diagnosis not present

## 2021-01-23 DIAGNOSIS — I5042 Chronic combined systolic (congestive) and diastolic (congestive) heart failure: Secondary | ICD-10-CM | POA: Diagnosis not present

## 2021-01-23 DIAGNOSIS — N183 Chronic kidney disease, stage 3 unspecified: Secondary | ICD-10-CM | POA: Diagnosis not present

## 2021-01-23 DIAGNOSIS — I2510 Atherosclerotic heart disease of native coronary artery without angina pectoris: Secondary | ICD-10-CM | POA: Diagnosis not present

## 2021-01-23 DIAGNOSIS — I951 Orthostatic hypotension: Secondary | ICD-10-CM | POA: Diagnosis not present

## 2021-01-23 DIAGNOSIS — L89322 Pressure ulcer of left buttock, stage 2: Secondary | ICD-10-CM | POA: Diagnosis not present

## 2021-01-23 DIAGNOSIS — E039 Hypothyroidism, unspecified: Secondary | ICD-10-CM | POA: Diagnosis not present

## 2021-01-25 DIAGNOSIS — E559 Vitamin D deficiency, unspecified: Secondary | ICD-10-CM | POA: Diagnosis not present

## 2021-01-25 DIAGNOSIS — Z1159 Encounter for screening for other viral diseases: Secondary | ICD-10-CM | POA: Diagnosis not present

## 2021-01-25 DIAGNOSIS — D649 Anemia, unspecified: Secondary | ICD-10-CM | POA: Diagnosis not present

## 2021-02-22 DIAGNOSIS — G4733 Obstructive sleep apnea (adult) (pediatric): Secondary | ICD-10-CM | POA: Diagnosis not present

## 2021-02-22 DIAGNOSIS — I5020 Unspecified systolic (congestive) heart failure: Secondary | ICD-10-CM | POA: Diagnosis not present

## 2021-02-22 DIAGNOSIS — L89322 Pressure ulcer of left buttock, stage 2: Secondary | ICD-10-CM | POA: Diagnosis not present

## 2021-02-22 DIAGNOSIS — I493 Ventricular premature depolarization: Secondary | ICD-10-CM | POA: Diagnosis not present

## 2021-02-22 DIAGNOSIS — I480 Paroxysmal atrial fibrillation: Secondary | ICD-10-CM | POA: Diagnosis not present

## 2021-02-22 DIAGNOSIS — I4892 Unspecified atrial flutter: Secondary | ICD-10-CM | POA: Diagnosis not present

## 2021-02-22 DIAGNOSIS — I2510 Atherosclerotic heart disease of native coronary artery without angina pectoris: Secondary | ICD-10-CM | POA: Diagnosis not present

## 2021-02-22 DIAGNOSIS — I4729 Other ventricular tachycardia: Secondary | ICD-10-CM | POA: Diagnosis not present

## 2021-02-22 DIAGNOSIS — I951 Orthostatic hypotension: Secondary | ICD-10-CM | POA: Diagnosis not present

## 2021-02-22 DIAGNOSIS — I5042 Chronic combined systolic (congestive) and diastolic (congestive) heart failure: Secondary | ICD-10-CM | POA: Diagnosis not present

## 2021-02-22 DIAGNOSIS — I10 Essential (primary) hypertension: Secondary | ICD-10-CM | POA: Diagnosis not present

## 2021-02-22 DIAGNOSIS — I130 Hypertensive heart and chronic kidney disease with heart failure and stage 1 through stage 4 chronic kidney disease, or unspecified chronic kidney disease: Secondary | ICD-10-CM | POA: Diagnosis not present

## 2021-02-22 DIAGNOSIS — N183 Chronic kidney disease, stage 3 unspecified: Secondary | ICD-10-CM | POA: Diagnosis not present

## 2021-02-22 DIAGNOSIS — I428 Other cardiomyopathies: Secondary | ICD-10-CM | POA: Diagnosis not present

## 2021-02-22 DIAGNOSIS — D631 Anemia in chronic kidney disease: Secondary | ICD-10-CM | POA: Diagnosis not present

## 2021-02-22 DIAGNOSIS — I447 Left bundle-branch block, unspecified: Secondary | ICD-10-CM | POA: Diagnosis not present

## 2021-02-22 DIAGNOSIS — E039 Hypothyroidism, unspecified: Secondary | ICD-10-CM | POA: Diagnosis not present

## 2021-03-08 DIAGNOSIS — L89322 Pressure ulcer of left buttock, stage 2: Secondary | ICD-10-CM | POA: Diagnosis not present

## 2021-03-08 DIAGNOSIS — N183 Chronic kidney disease, stage 3 unspecified: Secondary | ICD-10-CM | POA: Diagnosis not present

## 2021-03-08 DIAGNOSIS — I5042 Chronic combined systolic (congestive) and diastolic (congestive) heart failure: Secondary | ICD-10-CM | POA: Diagnosis not present

## 2021-03-08 DIAGNOSIS — I130 Hypertensive heart and chronic kidney disease with heart failure and stage 1 through stage 4 chronic kidney disease, or unspecified chronic kidney disease: Secondary | ICD-10-CM | POA: Diagnosis not present

## 2021-04-19 DIAGNOSIS — E559 Vitamin D deficiency, unspecified: Secondary | ICD-10-CM | POA: Diagnosis not present

## 2021-04-19 DIAGNOSIS — E538 Deficiency of other specified B group vitamins: Secondary | ICD-10-CM | POA: Diagnosis not present

## 2021-04-19 DIAGNOSIS — D649 Anemia, unspecified: Secondary | ICD-10-CM | POA: Diagnosis not present

## 2021-07-19 DIAGNOSIS — B351 Tinea unguium: Secondary | ICD-10-CM | POA: Diagnosis not present

## 2021-07-19 DIAGNOSIS — L853 Xerosis cutis: Secondary | ICD-10-CM | POA: Diagnosis not present

## 2021-07-19 DIAGNOSIS — N400 Enlarged prostate without lower urinary tract symptoms: Secondary | ICD-10-CM | POA: Diagnosis not present

## 2021-07-19 DIAGNOSIS — I5020 Unspecified systolic (congestive) heart failure: Secondary | ICD-10-CM | POA: Diagnosis not present

## 2021-08-09 DIAGNOSIS — N3941 Urge incontinence: Secondary | ICD-10-CM | POA: Diagnosis not present

## 2021-08-09 DIAGNOSIS — N400 Enlarged prostate without lower urinary tract symptoms: Secondary | ICD-10-CM | POA: Diagnosis not present

## 2021-08-09 DIAGNOSIS — I5020 Unspecified systolic (congestive) heart failure: Secondary | ICD-10-CM | POA: Diagnosis not present

## 2021-08-14 DIAGNOSIS — L89222 Pressure ulcer of left hip, stage 2: Secondary | ICD-10-CM | POA: Diagnosis not present

## 2021-08-16 DIAGNOSIS — L89222 Pressure ulcer of left hip, stage 2: Secondary | ICD-10-CM | POA: Diagnosis not present

## 2021-08-16 DIAGNOSIS — I5020 Unspecified systolic (congestive) heart failure: Secondary | ICD-10-CM | POA: Diagnosis not present

## 2021-08-16 DIAGNOSIS — N400 Enlarged prostate without lower urinary tract symptoms: Secondary | ICD-10-CM | POA: Diagnosis not present

## 2021-08-16 DIAGNOSIS — Z7901 Long term (current) use of anticoagulants: Secondary | ICD-10-CM | POA: Diagnosis not present

## 2021-08-18 DIAGNOSIS — I083 Combined rheumatic disorders of mitral, aortic and tricuspid valves: Secondary | ICD-10-CM | POA: Diagnosis not present

## 2021-08-18 DIAGNOSIS — I517 Cardiomegaly: Secondary | ICD-10-CM | POA: Diagnosis not present

## 2021-08-18 DIAGNOSIS — I429 Cardiomyopathy, unspecified: Secondary | ICD-10-CM | POA: Diagnosis not present

## 2021-08-18 DIAGNOSIS — I351 Nonrheumatic aortic (valve) insufficiency: Secondary | ICD-10-CM | POA: Diagnosis not present

## 2021-08-30 DIAGNOSIS — Z95810 Presence of automatic (implantable) cardiac defibrillator: Secondary | ICD-10-CM | POA: Diagnosis not present

## 2021-08-30 DIAGNOSIS — Z950 Presence of cardiac pacemaker: Secondary | ICD-10-CM | POA: Diagnosis not present

## 2021-08-30 DIAGNOSIS — I4901 Ventricular fibrillation: Secondary | ICD-10-CM | POA: Diagnosis not present

## 2021-08-30 DIAGNOSIS — I429 Cardiomyopathy, unspecified: Secondary | ICD-10-CM | POA: Diagnosis not present

## 2021-08-30 DIAGNOSIS — I493 Ventricular premature depolarization: Secondary | ICD-10-CM | POA: Diagnosis not present

## 2021-08-30 DIAGNOSIS — I4891 Unspecified atrial fibrillation: Secondary | ICD-10-CM | POA: Diagnosis not present

## 2021-08-30 DIAGNOSIS — I5022 Chronic systolic (congestive) heart failure: Secondary | ICD-10-CM | POA: Diagnosis not present

## 2021-09-17 DIAGNOSIS — I429 Cardiomyopathy, unspecified: Secondary | ICD-10-CM | POA: Diagnosis not present

## 2021-09-18 DIAGNOSIS — L89222 Pressure ulcer of left hip, stage 2: Secondary | ICD-10-CM | POA: Diagnosis not present

## 2021-09-18 DIAGNOSIS — Z7901 Long term (current) use of anticoagulants: Secondary | ICD-10-CM | POA: Diagnosis not present

## 2021-10-25 DIAGNOSIS — R350 Frequency of micturition: Secondary | ICD-10-CM | POA: Diagnosis not present

## 2021-10-25 DIAGNOSIS — N3941 Urge incontinence: Secondary | ICD-10-CM | POA: Diagnosis not present

## 2021-10-25 DIAGNOSIS — R3912 Poor urinary stream: Secondary | ICD-10-CM | POA: Diagnosis not present

## 2021-10-25 DIAGNOSIS — C61 Malignant neoplasm of prostate: Secondary | ICD-10-CM | POA: Diagnosis not present

## 2021-10-26 DIAGNOSIS — I429 Cardiomyopathy, unspecified: Secondary | ICD-10-CM | POA: Diagnosis not present

## 2021-10-26 DIAGNOSIS — I447 Left bundle-branch block, unspecified: Secondary | ICD-10-CM | POA: Diagnosis not present

## 2021-10-26 DIAGNOSIS — I10 Essential (primary) hypertension: Secondary | ICD-10-CM | POA: Diagnosis not present

## 2021-10-26 DIAGNOSIS — E7800 Pure hypercholesterolemia, unspecified: Secondary | ICD-10-CM | POA: Diagnosis not present

## 2021-12-07 DIAGNOSIS — Z79899 Other long term (current) drug therapy: Secondary | ICD-10-CM | POA: Diagnosis not present

## 2021-12-26 DIAGNOSIS — I429 Cardiomyopathy, unspecified: Secondary | ICD-10-CM | POA: Diagnosis not present

## 2021-12-29 DIAGNOSIS — E7800 Pure hypercholesterolemia, unspecified: Secondary | ICD-10-CM | POA: Diagnosis not present

## 2021-12-29 DIAGNOSIS — Z6841 Body Mass Index (BMI) 40.0 and over, adult: Secondary | ICD-10-CM | POA: Diagnosis not present

## 2021-12-29 DIAGNOSIS — E039 Hypothyroidism, unspecified: Secondary | ICD-10-CM | POA: Diagnosis not present

## 2021-12-29 DIAGNOSIS — I4891 Unspecified atrial fibrillation: Secondary | ICD-10-CM | POA: Diagnosis not present

## 2021-12-29 DIAGNOSIS — I509 Heart failure, unspecified: Secondary | ICD-10-CM | POA: Diagnosis not present

## 2021-12-29 DIAGNOSIS — I252 Old myocardial infarction: Secondary | ICD-10-CM | POA: Diagnosis not present

## 2021-12-29 DIAGNOSIS — M1990 Unspecified osteoarthritis, unspecified site: Secondary | ICD-10-CM | POA: Diagnosis not present

## 2021-12-29 DIAGNOSIS — R531 Weakness: Secondary | ICD-10-CM | POA: Diagnosis not present

## 2021-12-29 DIAGNOSIS — I517 Cardiomegaly: Secondary | ICD-10-CM | POA: Diagnosis not present

## 2021-12-29 DIAGNOSIS — W1830XA Fall on same level, unspecified, initial encounter: Secondary | ICD-10-CM | POA: Diagnosis not present

## 2021-12-29 DIAGNOSIS — N3941 Urge incontinence: Secondary | ICD-10-CM | POA: Diagnosis not present

## 2021-12-29 DIAGNOSIS — S8264XA Nondisplaced fracture of lateral malleolus of right fibula, initial encounter for closed fracture: Secondary | ICD-10-CM | POA: Diagnosis not present

## 2021-12-29 DIAGNOSIS — I5043 Acute on chronic combined systolic (congestive) and diastolic (congestive) heart failure: Secondary | ICD-10-CM | POA: Diagnosis not present

## 2021-12-29 DIAGNOSIS — N1831 Chronic kidney disease, stage 3a: Secondary | ICD-10-CM | POA: Diagnosis not present

## 2021-12-29 DIAGNOSIS — L03115 Cellulitis of right lower limb: Secondary | ICD-10-CM | POA: Diagnosis not present

## 2021-12-29 DIAGNOSIS — N401 Enlarged prostate with lower urinary tract symptoms: Secondary | ICD-10-CM | POA: Diagnosis not present

## 2021-12-29 DIAGNOSIS — I428 Other cardiomyopathies: Secondary | ICD-10-CM | POA: Diagnosis not present

## 2021-12-29 DIAGNOSIS — W19XXXA Unspecified fall, initial encounter: Secondary | ICD-10-CM | POA: Diagnosis not present

## 2021-12-29 DIAGNOSIS — S82831A Other fracture of upper and lower end of right fibula, initial encounter for closed fracture: Secondary | ICD-10-CM | POA: Diagnosis not present

## 2021-12-29 DIAGNOSIS — G4733 Obstructive sleep apnea (adult) (pediatric): Secondary | ICD-10-CM | POA: Diagnosis not present

## 2021-12-29 DIAGNOSIS — I130 Hypertensive heart and chronic kidney disease with heart failure and stage 1 through stage 4 chronic kidney disease, or unspecified chronic kidney disease: Secondary | ICD-10-CM | POA: Diagnosis not present

## 2021-12-29 DIAGNOSIS — I358 Other nonrheumatic aortic valve disorders: Secondary | ICD-10-CM | POA: Diagnosis not present

## 2021-12-29 DIAGNOSIS — I480 Paroxysmal atrial fibrillation: Secondary | ICD-10-CM | POA: Diagnosis not present

## 2021-12-29 DIAGNOSIS — R55 Syncope and collapse: Secondary | ICD-10-CM | POA: Diagnosis not present

## 2021-12-29 DIAGNOSIS — K219 Gastro-esophageal reflux disease without esophagitis: Secondary | ICD-10-CM | POA: Diagnosis not present

## 2021-12-29 DIAGNOSIS — L03116 Cellulitis of left lower limb: Secondary | ICD-10-CM | POA: Diagnosis not present

## 2022-01-05 DIAGNOSIS — I5020 Unspecified systolic (congestive) heart failure: Secondary | ICD-10-CM | POA: Diagnosis not present

## 2022-01-05 DIAGNOSIS — S82409A Unspecified fracture of shaft of unspecified fibula, initial encounter for closed fracture: Secondary | ICD-10-CM | POA: Diagnosis not present

## 2022-01-05 DIAGNOSIS — E7800 Pure hypercholesterolemia, unspecified: Secondary | ICD-10-CM | POA: Diagnosis not present

## 2022-01-05 DIAGNOSIS — I480 Paroxysmal atrial fibrillation: Secondary | ICD-10-CM | POA: Diagnosis not present

## 2022-01-12 DIAGNOSIS — I5020 Unspecified systolic (congestive) heart failure: Secondary | ICD-10-CM | POA: Diagnosis not present

## 2022-01-17 DIAGNOSIS — M84472A Pathological fracture, left ankle, initial encounter for fracture: Secondary | ICD-10-CM | POA: Diagnosis not present

## 2022-01-17 DIAGNOSIS — I480 Paroxysmal atrial fibrillation: Secondary | ICD-10-CM | POA: Diagnosis not present

## 2022-01-17 DIAGNOSIS — Z7901 Long term (current) use of anticoagulants: Secondary | ICD-10-CM | POA: Diagnosis not present

## 2022-01-17 DIAGNOSIS — Z79899 Other long term (current) drug therapy: Secondary | ICD-10-CM | POA: Diagnosis not present

## 2022-01-17 DIAGNOSIS — N400 Enlarged prostate without lower urinary tract symptoms: Secondary | ICD-10-CM | POA: Diagnosis not present

## 2022-01-17 DIAGNOSIS — I959 Hypotension, unspecified: Secondary | ICD-10-CM | POA: Diagnosis not present

## 2022-01-17 DIAGNOSIS — R55 Syncope and collapse: Secondary | ICD-10-CM | POA: Diagnosis not present

## 2022-01-17 DIAGNOSIS — E7800 Pure hypercholesterolemia, unspecified: Secondary | ICD-10-CM | POA: Diagnosis not present

## 2022-01-17 DIAGNOSIS — I110 Hypertensive heart disease with heart failure: Secondary | ICD-10-CM | POA: Diagnosis not present

## 2022-01-17 DIAGNOSIS — I5042 Chronic combined systolic (congestive) and diastolic (congestive) heart failure: Secondary | ICD-10-CM | POA: Diagnosis not present

## 2022-01-17 DIAGNOSIS — S82491D Other fracture of shaft of right fibula, subsequent encounter for closed fracture with routine healing: Secondary | ICD-10-CM | POA: Diagnosis not present

## 2022-01-17 DIAGNOSIS — I252 Old myocardial infarction: Secondary | ICD-10-CM | POA: Diagnosis not present

## 2022-01-17 DIAGNOSIS — S8264XD Nondisplaced fracture of lateral malleolus of right fibula, subsequent encounter for closed fracture with routine healing: Secondary | ICD-10-CM | POA: Diagnosis not present

## 2022-01-18 DIAGNOSIS — I10 Essential (primary) hypertension: Secondary | ICD-10-CM | POA: Diagnosis not present

## 2022-01-18 DIAGNOSIS — R55 Syncope and collapse: Secondary | ICD-10-CM | POA: Diagnosis not present

## 2022-01-18 DIAGNOSIS — I5020 Unspecified systolic (congestive) heart failure: Secondary | ICD-10-CM | POA: Diagnosis not present

## 2022-01-31 DIAGNOSIS — Z79899 Other long term (current) drug therapy: Secondary | ICD-10-CM | POA: Diagnosis not present

## 2022-01-31 DIAGNOSIS — M79644 Pain in right finger(s): Secondary | ICD-10-CM | POA: Diagnosis not present

## 2022-01-31 DIAGNOSIS — I10 Essential (primary) hypertension: Secondary | ICD-10-CM | POA: Diagnosis not present

## 2022-01-31 DIAGNOSIS — M79641 Pain in right hand: Secondary | ICD-10-CM | POA: Diagnosis not present

## 2022-02-05 DIAGNOSIS — I10 Essential (primary) hypertension: Secondary | ICD-10-CM | POA: Diagnosis not present

## 2022-02-05 DIAGNOSIS — M79644 Pain in right finger(s): Secondary | ICD-10-CM | POA: Diagnosis not present

## 2022-02-05 DIAGNOSIS — N183 Chronic kidney disease, stage 3 unspecified: Secondary | ICD-10-CM | POA: Diagnosis not present

## 2022-02-05 DIAGNOSIS — I5020 Unspecified systolic (congestive) heart failure: Secondary | ICD-10-CM | POA: Diagnosis not present

## 2022-02-14 DIAGNOSIS — S8264XA Nondisplaced fracture of lateral malleolus of right fibula, initial encounter for closed fracture: Secondary | ICD-10-CM | POA: Diagnosis not present

## 2022-03-01 DIAGNOSIS — I10 Essential (primary) hypertension: Secondary | ICD-10-CM | POA: Diagnosis not present

## 2022-03-01 DIAGNOSIS — I5020 Unspecified systolic (congestive) heart failure: Secondary | ICD-10-CM | POA: Diagnosis not present

## 2022-03-02 DIAGNOSIS — I110 Hypertensive heart disease with heart failure: Secondary | ICD-10-CM | POA: Diagnosis not present

## 2022-03-02 DIAGNOSIS — I5023 Acute on chronic systolic (congestive) heart failure: Secondary | ICD-10-CM | POA: Diagnosis not present

## 2022-03-02 DIAGNOSIS — N183 Chronic kidney disease, stage 3 unspecified: Secondary | ICD-10-CM | POA: Diagnosis not present

## 2022-03-14 DIAGNOSIS — S8261XD Displaced fracture of lateral malleolus of right fibula, subsequent encounter for closed fracture with routine healing: Secondary | ICD-10-CM | POA: Diagnosis not present

## 2022-04-04 DIAGNOSIS — I4729 Other ventricular tachycardia: Secondary | ICD-10-CM | POA: Diagnosis not present

## 2022-04-04 DIAGNOSIS — L304 Erythema intertrigo: Secondary | ICD-10-CM | POA: Diagnosis not present

## 2022-04-04 DIAGNOSIS — I5020 Unspecified systolic (congestive) heart failure: Secondary | ICD-10-CM | POA: Diagnosis not present

## 2022-04-04 DIAGNOSIS — I10 Essential (primary) hypertension: Secondary | ICD-10-CM | POA: Diagnosis not present

## 2022-04-04 DIAGNOSIS — R635 Abnormal weight gain: Secondary | ICD-10-CM | POA: Diagnosis not present

## 2022-05-04 DIAGNOSIS — I480 Paroxysmal atrial fibrillation: Secondary | ICD-10-CM | POA: Diagnosis not present

## 2022-05-04 DIAGNOSIS — R55 Syncope and collapse: Secondary | ICD-10-CM | POA: Diagnosis not present

## 2022-05-04 DIAGNOSIS — R000 Tachycardia, unspecified: Secondary | ICD-10-CM | POA: Diagnosis not present

## 2022-05-04 DIAGNOSIS — I959 Hypotension, unspecified: Secondary | ICD-10-CM | POA: Diagnosis not present

## 2022-05-04 DIAGNOSIS — I252 Old myocardial infarction: Secondary | ICD-10-CM | POA: Diagnosis not present

## 2022-05-04 DIAGNOSIS — R001 Bradycardia, unspecified: Secondary | ICD-10-CM | POA: Diagnosis not present

## 2022-05-04 DIAGNOSIS — I110 Hypertensive heart disease with heart failure: Secondary | ICD-10-CM | POA: Diagnosis not present

## 2022-05-04 DIAGNOSIS — I5042 Chronic combined systolic (congestive) and diastolic (congestive) heart failure: Secondary | ICD-10-CM | POA: Diagnosis not present

## 2022-05-04 DIAGNOSIS — R0989 Other specified symptoms and signs involving the circulatory and respiratory systems: Secondary | ICD-10-CM | POA: Diagnosis not present

## 2022-05-04 DIAGNOSIS — I517 Cardiomegaly: Secondary | ICD-10-CM | POA: Diagnosis not present

## 2022-05-04 DIAGNOSIS — Z7401 Bed confinement status: Secondary | ICD-10-CM | POA: Diagnosis not present

## 2022-05-04 DIAGNOSIS — Z7901 Long term (current) use of anticoagulants: Secondary | ICD-10-CM | POA: Diagnosis not present

## 2022-05-04 DIAGNOSIS — R1110 Vomiting, unspecified: Secondary | ICD-10-CM | POA: Diagnosis not present

## 2022-05-04 DIAGNOSIS — E7800 Pure hypercholesterolemia, unspecified: Secondary | ICD-10-CM | POA: Diagnosis not present

## 2022-05-04 DIAGNOSIS — R079 Chest pain, unspecified: Secondary | ICD-10-CM | POA: Diagnosis not present

## 2022-05-04 DIAGNOSIS — R279 Unspecified lack of coordination: Secondary | ICD-10-CM | POA: Diagnosis not present

## 2022-05-04 DIAGNOSIS — R531 Weakness: Secondary | ICD-10-CM | POA: Diagnosis not present

## 2022-05-10 DIAGNOSIS — U071 COVID-19: Secondary | ICD-10-CM | POA: Diagnosis not present

## 2022-05-25 DIAGNOSIS — L24A2 Irritant contact dermatitis due to fecal, urinary or dual incontinence: Secondary | ICD-10-CM | POA: Diagnosis not present

## 2022-06-01 DIAGNOSIS — L24A2 Irritant contact dermatitis due to fecal, urinary or dual incontinence: Secondary | ICD-10-CM | POA: Diagnosis not present

## 2022-06-07 DIAGNOSIS — Z7189 Other specified counseling: Secondary | ICD-10-CM | POA: Diagnosis not present

## 2022-06-08 DIAGNOSIS — L24A2 Irritant contact dermatitis due to fecal, urinary or dual incontinence: Secondary | ICD-10-CM | POA: Diagnosis not present

## 2022-06-12 DIAGNOSIS — I5020 Unspecified systolic (congestive) heart failure: Secondary | ICD-10-CM | POA: Diagnosis not present

## 2022-06-12 DIAGNOSIS — I10 Essential (primary) hypertension: Secondary | ICD-10-CM | POA: Diagnosis not present

## 2022-06-12 DIAGNOSIS — Z7189 Other specified counseling: Secondary | ICD-10-CM | POA: Diagnosis not present

## 2022-06-12 DIAGNOSIS — R55 Syncope and collapse: Secondary | ICD-10-CM | POA: Diagnosis not present

## 2022-06-13 DIAGNOSIS — R531 Weakness: Secondary | ICD-10-CM | POA: Diagnosis not present

## 2022-06-14 DIAGNOSIS — I5020 Unspecified systolic (congestive) heart failure: Secondary | ICD-10-CM | POA: Diagnosis not present

## 2022-06-14 DIAGNOSIS — E039 Hypothyroidism, unspecified: Secondary | ICD-10-CM | POA: Diagnosis not present

## 2022-06-19 DIAGNOSIS — I5023 Acute on chronic systolic (congestive) heart failure: Secondary | ICD-10-CM | POA: Diagnosis not present

## 2022-06-26 DIAGNOSIS — I429 Cardiomyopathy, unspecified: Secondary | ICD-10-CM | POA: Diagnosis not present

## 2022-06-27 DIAGNOSIS — N183 Chronic kidney disease, stage 3 unspecified: Secondary | ICD-10-CM | POA: Diagnosis not present

## 2022-07-11 DIAGNOSIS — E039 Hypothyroidism, unspecified: Secondary | ICD-10-CM | POA: Diagnosis not present

## 2022-08-02 DIAGNOSIS — N183 Chronic kidney disease, stage 3 unspecified: Secondary | ICD-10-CM | POA: Diagnosis not present

## 2022-08-02 DIAGNOSIS — I5020 Unspecified systolic (congestive) heart failure: Secondary | ICD-10-CM | POA: Diagnosis not present

## 2022-08-02 DIAGNOSIS — D649 Anemia, unspecified: Secondary | ICD-10-CM | POA: Diagnosis not present

## 2022-08-02 DIAGNOSIS — E039 Hypothyroidism, unspecified: Secondary | ICD-10-CM | POA: Diagnosis not present

## 2022-08-09 DIAGNOSIS — L304 Erythema intertrigo: Secondary | ICD-10-CM | POA: Diagnosis not present

## 2022-08-24 DIAGNOSIS — E039 Hypothyroidism, unspecified: Secondary | ICD-10-CM | POA: Diagnosis not present

## 2022-10-03 DIAGNOSIS — N183 Chronic kidney disease, stage 3 unspecified: Secondary | ICD-10-CM | POA: Diagnosis not present

## 2022-10-03 DIAGNOSIS — I5020 Unspecified systolic (congestive) heart failure: Secondary | ICD-10-CM | POA: Diagnosis not present

## 2022-10-03 DIAGNOSIS — E039 Hypothyroidism, unspecified: Secondary | ICD-10-CM | POA: Diagnosis not present

## 2022-10-03 DIAGNOSIS — M25511 Pain in right shoulder: Secondary | ICD-10-CM | POA: Diagnosis not present
